# Patient Record
Sex: Female | Born: 1990 | Race: Black or African American | Hispanic: No | Marital: Single | Smoking: Current every day smoker
Health system: Southern US, Community
[De-identification: ages and names within clinical notes are randomized; demographics above are authoritative.]

## PROBLEM LIST (undated history)

## (undated) ENCOUNTER — Inpatient Hospital Stay (HOSPITAL_COMMUNITY): Payer: Self-pay

## (undated) DIAGNOSIS — A539 Syphilis, unspecified: Secondary | ICD-10-CM

## (undated) DIAGNOSIS — E039 Hypothyroidism, unspecified: Secondary | ICD-10-CM

## (undated) DIAGNOSIS — F32A Depression, unspecified: Secondary | ICD-10-CM

## (undated) DIAGNOSIS — A749 Chlamydial infection, unspecified: Secondary | ICD-10-CM

## (undated) DIAGNOSIS — F329 Major depressive disorder, single episode, unspecified: Secondary | ICD-10-CM

## (undated) HISTORY — DX: Syphilis, unspecified: A53.9

## (undated) HISTORY — PX: NOSE SURGERY: SHX723

## (undated) HISTORY — DX: Major depressive disorder, single episode, unspecified: F32.9

## (undated) HISTORY — DX: Depression, unspecified: F32.A

## (undated) HISTORY — PX: WISDOM TOOTH EXTRACTION: SHX21

---
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
## 2012-03-25 ENCOUNTER — Encounter: Payer: Medicaid Other | Admitting: Advanced Practice Midwife

## 2012-05-22 ENCOUNTER — Encounter (HOSPITAL_COMMUNITY): Payer: Self-pay | Admitting: *Deleted

## 2012-05-22 ENCOUNTER — Inpatient Hospital Stay (HOSPITAL_COMMUNITY)
Admission: AD | Admit: 2012-05-22 | Discharge: 2012-05-22 | Disposition: A | Discharge status: Home / Self Care | Payer: Medicaid Other | Source: Ambulatory Visit | Attending: Obstetrics & Gynecology | Admitting: Obstetrics & Gynecology

## 2012-05-22 ENCOUNTER — Inpatient Hospital Stay (HOSPITAL_COMMUNITY): Payer: Medicaid Other

## 2012-05-22 DIAGNOSIS — Z331 Pregnant state, incidental: Secondary | ICD-10-CM

## 2012-05-22 DIAGNOSIS — R109 Unspecified abdominal pain: Secondary | ICD-10-CM | POA: Insufficient documentation

## 2012-05-22 DIAGNOSIS — A499 Bacterial infection, unspecified: Secondary | ICD-10-CM | POA: Insufficient documentation

## 2012-05-22 DIAGNOSIS — B9689 Other specified bacterial agents as the cause of diseases classified elsewhere: Secondary | ICD-10-CM | POA: Insufficient documentation

## 2012-05-22 DIAGNOSIS — N760 Acute vaginitis: Secondary | ICD-10-CM | POA: Insufficient documentation

## 2012-05-22 DIAGNOSIS — Z3490 Encounter for supervision of normal pregnancy, unspecified, unspecified trimester: Secondary | ICD-10-CM

## 2012-05-22 DIAGNOSIS — O2390 Unspecified genitourinary tract infection in pregnancy, unspecified trimester: Secondary | ICD-10-CM | POA: Insufficient documentation

## 2012-05-22 LAB — CBC
HCT: 35.2 % — ABNORMAL LOW (ref 36.0–46.0)
MCH: 29.3 pg (ref 26.0–34.0)
MCV: 86.1 fL (ref 78.0–100.0)
RBC: 4.09 MIL/uL (ref 3.87–5.11)
WBC: 12.2 10*3/uL — ABNORMAL HIGH (ref 4.0–10.5)

## 2012-05-22 LAB — URINALYSIS, ROUTINE W REFLEX MICROSCOPIC
Bilirubin Urine: NEGATIVE
Glucose, UA: NEGATIVE mg/dL
Hgb urine dipstick: NEGATIVE
Ketones, ur: NEGATIVE mg/dL
Nitrite: NEGATIVE
Specific Gravity, Urine: 1.01 (ref 1.005–1.030)
pH: 7.5 (ref 5.0–8.0)

## 2012-05-22 LAB — WET PREP, GENITAL
Trich, Wet Prep: NONE SEEN
Yeast Wet Prep HPF POC: NONE SEEN

## 2012-05-22 MED ORDER — PRENATAL VIT-FEPOLY-FA-DHA 27-1-200 MG PO CAPS: 1.0000 | ORAL_CAPSULE | Freq: Every day | ORAL | Status: DC

## 2012-05-22 MED ORDER — METRONIDAZOLE 500 MG PO TABS: 2000.0000 mg | ORAL_TABLET | Freq: Once | ORAL | Status: AC

## 2012-05-22 NOTE — Progress Notes (Signed)
Marie Williams CNM in to discuss u/s results and d/c plan. Written and verbal d/c instructions given and understanding voiced. 

## 2012-05-22 NOTE — MAU Provider Note (Signed)
History     CSN: [id number]  Arrival date and time: 05/22/12 1946   First Provider Initiated Contact with Patient 05/22/12 2125      Chief Complaint  Patient presents with  . Amenorrhea  . Vaginal Discharge  . Abdominal Pain   HPI This is a 21 y.o. female at Unknown [place] ( 5+wks by LMP) who presents with c/o + UPT and pelvic cramps.  Has not seen anyone for pregnancy yet. Denies bleeding.   RN Note: Pt states, " I missed my period and my HPT was positive. I've been having pain in my low abdomen like my period is coming on all this month. I have been seeing [place] clumpy vaginal discharge with a little odor but no itching."       OB History    Grav Para Term Preterm Abortions TAB SAB Ect Mult Living   2               Past Medical History  Diagnosis Date  . No pertinent past medical history     Past Surgical History  Procedure Date  . Nose surgery   . Wisdom tooth extraction     Family History  Problem Relation Age of Onset  . Other Neg Hx     History  Substance Use Topics  . Smoking status: Current Some Day Smoker  . Smokeless tobacco: Not on file  . Alcohol Use: Yes     occasional    Allergies: No Known Allergies  Prescriptions prior to admission  Medication Sig Dispense Refill  . ibuprofen (ADVIL,MOTRIN) 100 MG tablet Take 400 mg by mouth every 6 (six) hours as needed. For pain or headache        ROS See HPI  Physical Exam   Blood pressure 108/64, pulse 84, temperature 98.3 F (36.8 C), temperature source Oral, resp. rate 18, height 5' 1.75" (1.568 m), weight 120 lb (54.432 kg), last menstrual period 04/15/2012.  Physical Exam  Constitutional: She is oriented to person, place, and time. She appears well-developed and well-nourished. No distress.  HENT:  Head: Normocephalic.  Cardiovascular: Normal rate.   Respiratory: Effort normal.  GI: Soft. She exhibits no distension and no mass. There is no tenderness. There is no rebound and no guarding.    Genitourinary: Vagina normal and uterus normal. No vaginal discharge found.       Uterus small, 5-6 week size Nontender. Adnexae nontender bilaterally Cervix long and closed  Musculoskeletal: Normal range of motion.  Neurological: She is alert and oriented to person, place, and time.  Skin: Skin is warm and dry.  Psychiatric: She has a normal mood and affect.   Results for orders placed during the hospital encounter of 05/22/12 (from the past 24 hour(s))  URINALYSIS, ROUTINE W REFLEX MICROSCOPIC     Status: Normal   Collection Time   05/22/12  8:21 PM      Component Value Range   Color, Urine YELLOW  YELLOW   APPearance CLEAR  CLEAR   Specific Gravity, Urine 1.010  1.005 - 1.030   pH 7.5  5.0 - 8.0   Glucose, UA NEGATIVE  NEGATIVE mg/dL   Hgb urine dipstick NEGATIVE  NEGATIVE   Bilirubin Urine NEGATIVE  NEGATIVE   Ketones, ur NEGATIVE  NEGATIVE mg/dL   Protein, ur NEGATIVE  NEGATIVE mg/dL   Urobilinogen, UA 0.2  0.0 - 1.0 mg/dL   Nitrite NEGATIVE  NEGATIVE   Leukocytes, UA NEGATIVE  NEGATIVE  POCT PREGNANCY, URINE  Status: Abnormal   Collection Time   05/22/12  8:31 PM      Component Value Range   Preg Test, Ur POSITIVE (*) NEGATIVE  HCG, QUANTITATIVE, PREGNANCY     Status: Abnormal   Collection Time   05/22/12  9:11 PM      Component Value Range   hCG, Beta Chain, Quant, [place] [address] (*) <5 mIU/mL  WET PREP, GENITAL     Status: Abnormal   Collection Time   05/22/12  9:26 PM      Component Value Range   Yeast Wet Prep HPF POC NONE SEEN  NONE SEEN   Trich, Wet Prep NONE SEEN  NONE SEEN   Clue Cells Wet Prep HPF POC MANY (*) NONE SEEN   WBC, Wet Prep HPF POC FEW (*) NONE SEEN  CBC     Status: Abnormal   Collection Time   05/22/12  9:28 PM      Component Value Range   WBC 12.2 (*) 4.0 - 10.5 K/uL   RBC 4.09  3.87 - 5.11 MIL/uL   Hemoglobin 12.0  12.0 - 15.0 g/dL   HCT 52.8 (*) 41.3 - 24.4 %   MCV 86.1  78.0 - 100.0 fL   MCH 29.3  26.0 - 34.0 pg   MCHC 34.1  30.0 - 36.0  g/dL   RDW 01.0  27.2 - 53.6 %   Platelets 340  150 - 400 K/uL    US Ob Comp Less 14 Wks  05/22/2012  The *RADIOLOGY REPORT*  Clinical Data: Right-sided pelvic pain  OBSTETRIC <14 WK [place] AND TRANSVAGINAL OB US  Technique:  Both transabdominal and transvaginal ultrasound examinations were performed for complete evaluation of the gestation as well as the maternal uterus, adnexal regions, and pelvic cul-de-sac.  Transvaginal technique was performed to assess early pregnancy.  Comparison:  None.  Intrauterine gestational sac:  Visualized/normal in shape. Yolk sac: Identified Embryo: Not definitively identified.  There is a question of an early fetal pole on the last image.  MSD: 12 mm  5 w 6 d [place] EDC: 01/16/2013  Maternal uterus/adnexae: No subchorionic hemorrhage.  Normal sonographic appearance to the ovaries with corpus luteum noted on the right.  No free fluid.  IMPRESSION: Single intrauterine gestational sac.  Contains a yolk sac however no definite embryo at this time.  This may be due to the early timing of the examination as the mean sac diameter measures only 12 mm.  Recommend serial quantitative beta HCG and ultrasound follow- up as warranted.   Original Report Authenticated By: Waneta Martins, M.D.      MAU Course  Procedures   Assessment and Plan  A:  SIUP at 5.6 weeks      + Yolk Sac seen      Early gestation      Bacterial Vaginosis  P:  Discussed with Dr Erin Fulling      Will have patient Follow up with prenatal care.      Rx Metronidazole and Prenatal Vitamins      No further USs necessary via MAU/ER at this time.  Erin Bell 05/22/2012, 10:14 PM

## 2012-05-22 NOTE — MAU Note (Signed)
Pt states, " I missed my period and my HPT was positive. I've been having pain in my low abdomen like my period is coming on all this month. I have been seeing [place] clumpy vaginal discharge with a little odor but no itching."

## 2012-05-23 NOTE — MAU Provider Note (Signed)
Attestation of Attending Supervision of Advanced Practitioner (CNM/NP): Evaluation and management procedures were performed by the Advanced Practitioner under my supervision and collaboration.  I have reviewed the Advanced Practitioner's note and chart, and I agree with the management and plan.  HARRAWAY-SMITH, Kyrianna Barletta 4:36 AM

## 2012-05-24 ENCOUNTER — Inpatient Hospital Stay (HOSPITAL_COMMUNITY)
Admission: AD | Admit: 2012-05-24 | Discharge: 2012-05-24 | Disposition: A | Discharge status: Home / Self Care | Payer: Medicaid Other | Source: Ambulatory Visit | Attending: Obstetrics & Gynecology | Admitting: Obstetrics & Gynecology

## 2012-05-25 LAB — GC/CHLAMYDIA PROBE AMP, GENITAL: GC Probe Amp, Genital: NEGATIVE

## 2012-05-27 ENCOUNTER — Telehealth: Payer: Self-pay | Admitting: *Deleted

## 2012-05-27 MED ORDER — AZITHROMYCIN 250 MG PO TABS: ORAL_TABLET | ORAL | Status: DC

## 2012-05-27 NOTE — Telephone Encounter (Signed)
Called pt and left message to return my call and leave message on nurse voice mail as to when she may be reached.  Pt needs to be informed of +chlamydia result, medication order has been sent to her pharmacy and her partner needs treatment also. [facility] form (Communicable Disease Report) completed and faxed.

## 2012-05-27 NOTE — Telephone Encounter (Signed)
Message copied by Jill Side on Wed May 27, 2012  1:59 PM ------      Message from: Mayra Neer P      Created: Wed May 27, 2012  8:32 AM       Please take care of this.      ----- Message -----         From: Juliette Mangle, RN         Sent: 05/27/2012   8:31 AM           To: [facility]            Please take care of this.      ----- Message -----         From: Lab In [facility] Interface         Sent: 05/25/2012   1:31 PM           To: [facility] Results

## 2012-05-28 NOTE — Telephone Encounter (Signed)
Called Erin Bell and left a message we are trying to call you with important  Information- please call office during office hours.

## 2012-05-28 NOTE — Telephone Encounter (Signed)
Pt called back stating she had received our message. Informed patient that her test results came back positive for chlamydia and that a medication had been called in for her to pick up at the [facility] on [address] and that she needed to inform her partner as well because they needed treatment. Told patient they could go to the health department and get treated for that. Patient voiced understanding and had no further questions.

## 2012-06-08 ENCOUNTER — Telehealth: Payer: Self-pay | Admitting: Obstetrics and Gynecology

## 2012-06-08 ENCOUNTER — Encounter (HOSPITAL_COMMUNITY): Payer: Self-pay | Admitting: *Deleted

## 2012-06-09 ENCOUNTER — Telehealth: Payer: Self-pay | Admitting: Obstetrics and Gynecology

## 2012-06-09 NOTE — Telephone Encounter (Signed)
Left msg on pt's voice mail to call back rgd msg. bt cma

## 2012-06-09 NOTE — Telephone Encounter (Signed)
Spoke with pt rgd msg. Pt stated that she went to the bathroom yesterday and had some mucous when she went to the bathroom. Pt also stated that she is not having the cramping or mucous today. Advised pt to make sure she is increasing her fluids. Pt's voice understanding. bt cma

## 2012-06-22 ENCOUNTER — Encounter (HOSPITAL_COMMUNITY): Payer: Self-pay | Admitting: Emergency Medicine

## 2012-06-22 ENCOUNTER — Emergency Department (HOSPITAL_COMMUNITY)
Admission: EM | Admit: 2012-06-22 | Discharge: 2012-06-22 | Disposition: A | Discharge status: Home / Self Care | Payer: Medicaid Other | Attending: Emergency Medicine | Admitting: Emergency Medicine

## 2012-06-22 DIAGNOSIS — M545 Low back pain, unspecified: Secondary | ICD-10-CM | POA: Insufficient documentation

## 2012-06-22 DIAGNOSIS — Z331 Pregnant state, incidental: Secondary | ICD-10-CM | POA: Insufficient documentation

## 2012-06-22 DIAGNOSIS — R109 Unspecified abdominal pain: Secondary | ICD-10-CM | POA: Insufficient documentation

## 2012-06-22 LAB — WET PREP, GENITAL
Clue Cells Wet Prep HPF POC: NONE SEEN
Trich, Wet Prep: NONE SEEN

## 2012-06-22 MED ORDER — CYCLOBENZAPRINE HCL 10 MG PO TABS: 10.0000 mg | ORAL_TABLET | Freq: Two times a day (BID) | ORAL | Status: DC | PRN

## 2012-06-22 NOTE — ED Provider Notes (Signed)
Medical screening examination/treatment/procedure(s) were performed by non-physician practitioner and as supervising physician I was immediately available for consultation/collaboration.  Jones Skene, M.D.     Jones Skene, MD 06/22/12 Rickey Primus
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
## 2012-07-31 LAB — OB RESULTS CONSOLE ANTIBODY SCREEN: Antibody Screen: NEGATIVE

## 2012-07-31 LAB — OB RESULTS CONSOLE ABO/RH: RH Type: POSITIVE

## 2012-07-31 LAB — OB RESULTS CONSOLE HEPATITIS B SURFACE ANTIGEN: Hepatitis B Surface Ag: NEGATIVE

## 2012-07-31 LAB — OB RESULTS CONSOLE RPR: RPR: NONREACTIVE

## 2012-08-10 ENCOUNTER — Inpatient Hospital Stay (HOSPITAL_COMMUNITY)
Admission: AD | Admit: 2012-08-10 | Discharge: 2012-08-10 | Disposition: A | Discharge status: Home / Self Care | Payer: Medicaid Other | Source: Ambulatory Visit | Attending: Obstetrics | Admitting: Obstetrics

## 2012-08-10 ENCOUNTER — Encounter (HOSPITAL_COMMUNITY): Payer: Self-pay | Admitting: *Deleted

## 2012-08-10 DIAGNOSIS — R109 Unspecified abdominal pain: Secondary | ICD-10-CM | POA: Insufficient documentation

## 2012-08-10 DIAGNOSIS — O99891 Other specified diseases and conditions complicating pregnancy: Secondary | ICD-10-CM | POA: Insufficient documentation

## 2012-08-10 DIAGNOSIS — N949 Unspecified condition associated with female genital organs and menstrual cycle: Secondary | ICD-10-CM

## 2012-08-10 LAB — URINALYSIS, ROUTINE W REFLEX MICROSCOPIC
Leukocytes, UA: NEGATIVE
Nitrite: NEGATIVE
Protein, ur: NEGATIVE mg/dL
Specific Gravity, Urine: 1.015 (ref 1.005–1.030)
Urobilinogen, UA: 0.2 mg/dL (ref 0.0–1.0)

## 2012-08-10 NOTE — MAU Provider Note (Signed)
Chief Complaint  Patient presents with  . Abdominal Pain    S: Erin Bell is a 21 y.o. G1P0 at [redacted]w[redacted]d weeks presenting with onset at 0200 of sharp  pain in right groin and lower abdomen. The pain is exacerbated by walking and changing positions. No dysuria, urgency or frequency. She denies contractions, vaginal bleeding or leakage of fluid. Fetus is active.  ROS: Negative except as noted above.  O: Filed Vitals:   08/10/12 1028  BP: 108/63  Pulse: 95  Temp: 97.8 F (36.6 C)  Resp: 18    Gen: NAD Abd: soft, mildly tender in lower abd and groin Pelvic: NEFG, BUS neg                          Cx: L/C/H FHR: 144 DT UCs: none  Results for orders placed during the hospital encounter of 08/10/12 (from the past 24 hour(s))  URINALYSIS, ROUTINE W REFLEX MICROSCOPIC     Status: Abnormal   Collection Time   08/10/12 10:34 AM      Component Value Range   Color, Urine YELLOW  YELLOW   APPearance HAZY (*) CLEAR   Specific Gravity, Urine 1.015  1.005 - 1.030   pH 8.0  5.0 - 8.0   Glucose, UA NEGATIVE  NEGATIVE mg/dL   Hgb urine dipstick NEGATIVE  NEGATIVE   Bilirubin Urine NEGATIVE  NEGATIVE   Ketones, ur NEGATIVE  NEGATIVE mg/dL   Protein, ur NEGATIVE  NEGATIVE mg/dL   Urobilinogen, UA 0.2  0.0 - 1.0 mg/dL   Nitrite NEGATIVE  NEGATIVE   Leukocytes, UA NEGATIVE  NEGATIVE    A:  G1 at [redacted]w[redacted]d Round Ligament Pain  P:  Reassurance given and general relief measures reviewed: avoidance of precipitating movements and AVS on RLP Call Dr. Elsie Stain office to reschedule this wk's appt if not having worse pain or other concerns

## 2012-08-10 NOTE — MAU Note (Signed)
Sharp pain in RLQ, first noted when got up to use the bathroom during the night, goes away with sitting or laying down.

## 2012-08-13 ENCOUNTER — Other Ambulatory Visit (HOSPITAL_COMMUNITY): Payer: Self-pay | Admitting: Obstetrics

## 2012-08-13 DIAGNOSIS — Z3480 Encounter for supervision of other normal pregnancy, unspecified trimester: Secondary | ICD-10-CM

## 2012-08-18 ENCOUNTER — Ambulatory Visit (HOSPITAL_COMMUNITY)
Admission: RE | Admit: 2012-08-18 | Discharge: 2012-08-18 | Disposition: A | Discharge status: Home / Self Care | Payer: Medicaid Other | Source: Ambulatory Visit | Attending: Obstetrics | Admitting: Obstetrics

## 2012-08-18 DIAGNOSIS — Z3480 Encounter for supervision of other normal pregnancy, unspecified trimester: Secondary | ICD-10-CM

## 2012-08-18 DIAGNOSIS — Z1389 Encounter for screening for other disorder: Secondary | ICD-10-CM | POA: Insufficient documentation

## 2012-08-18 DIAGNOSIS — O358XX0 Maternal care for other (suspected) fetal abnormality and damage, not applicable or unspecified: Secondary | ICD-10-CM | POA: Insufficient documentation

## 2012-08-18 DIAGNOSIS — Z363 Encounter for antenatal screening for malformations: Secondary | ICD-10-CM | POA: Insufficient documentation

## 2012-09-13 ENCOUNTER — Inpatient Hospital Stay (HOSPITAL_COMMUNITY)
Admission: AD | Admit: 2012-09-13 | Discharge: 2012-09-13 | Disposition: A | Discharge status: Home / Self Care | Payer: Medicaid Other | Source: Ambulatory Visit | Attending: Obstetrics | Admitting: Obstetrics

## 2012-09-13 ENCOUNTER — Encounter (HOSPITAL_COMMUNITY): Payer: Self-pay | Admitting: Obstetrics and Gynecology

## 2012-09-13 DIAGNOSIS — N949 Unspecified condition associated with female genital organs and menstrual cycle: Secondary | ICD-10-CM | POA: Insufficient documentation

## 2012-09-13 DIAGNOSIS — N760 Acute vaginitis: Secondary | ICD-10-CM

## 2012-09-13 DIAGNOSIS — O99891 Other specified diseases and conditions complicating pregnancy: Secondary | ICD-10-CM | POA: Insufficient documentation

## 2012-09-13 DIAGNOSIS — A499 Bacterial infection, unspecified: Secondary | ICD-10-CM

## 2012-09-13 HISTORY — DX: Chlamydial infection, unspecified: A74.9

## 2012-09-13 LAB — URINALYSIS, ROUTINE W REFLEX MICROSCOPIC
Glucose, UA: NEGATIVE mg/dL
Ketones, ur: NEGATIVE mg/dL
Leukocytes, UA: NEGATIVE
Nitrite: NEGATIVE
Protein, ur: NEGATIVE mg/dL
Urobilinogen, UA: 0.2 mg/dL (ref 0.0–1.0)

## 2012-09-13 LAB — WET PREP, GENITAL: Trich, Wet Prep: NONE SEEN

## 2012-09-13 MED ORDER — METRONIDAZOLE 500 MG PO TABS: 500.0000 mg | ORAL_TABLET | Freq: Two times a day (BID) | ORAL | Status: DC

## 2012-09-13 NOTE — MAU Note (Signed)
Pt presents to MAU with chief complaint of vaginal discharge that she presumes to be her mucous plug. Pt is [redacted]w[redacted]d; arrived by EMS with a discharge that she noticed today that was yellow with mucous present. Pt recently had intercourse yesterday around noon and denies pain. Pt is a G1, seen in Dr. Elsie Stain office

## 2012-09-13 NOTE — MAU Provider Note (Signed)
  History     CSN: [id number]  Arrival date and time: 09/13/12 1615   First Provider Initiated Contact with Patient 09/13/12 1643      Chief Complaint  Patient presents with  . Vaginal Discharge   HPI Erin Bell is a 21 y.o. female @ [redacted]w[redacted]d gestation who presents to MAU with vaginal discharge. Onset one week ago. She describes the discharge as yellow mucous. She denies pain or vaginal bleeding. She wanted to be sure she didn't loose her mucous plug or water broke. Last sexual intercourse yesterday without pain or bleeding. The history was provided by the patient.  OB History    Grav Para Term Preterm Abortions TAB SAB Ect Mult Living   1 0 0 0 0 0 0 0 0 0       Past Medical History  Diagnosis Date  . No pertinent past medical history   . Chlamydia     Past Surgical History  Procedure Date  . Nose surgery   . Wisdom tooth extraction     Family History  Problem Relation Age of Onset  . Other Neg Hx     History  Substance Use Topics  . Smoking status: Former Games developer  . Smokeless tobacco: Not on file  . Alcohol Use: No     Comment: occasional    Allergies: No Known Allergies  Prescriptions prior to admission  Medication Sig Dispense Refill  . acetaminophen (TYLENOL) 325 MG tablet Take 650 mg by mouth every 6 (six) hours as needed. pain      . Prenatal Vit-FePoly-FA-DHA 27-1-200 MG CAPS Take 1 tablet by mouth daily.  30 capsule  12    ROS: As stated in HPI Physical Exam   Blood pressure 121/63, pulse 95, temperature 99.3 F (37.4 C), temperature source Oral, resp. rate 18, last menstrual period 04/15/2012.  Physical Exam  Nursing note and vitals reviewed. Constitutional: She is oriented to person, place, and time. She appears well-developed and well-nourished. No distress.  HENT:  Head: Normocephalic and atraumatic.  Eyes: EOM are normal.  Neck: Neck supple.  Cardiovascular: Normal rate.   Respiratory: Effort normal.  GI: Soft. There is no tenderness.        Gravid, positive FHT's  Genitourinary:       External genitalia without lesions. Mucous discharge vaginal vault, no pooling. Uterus consistent with dates.  Musculoskeletal: Normal range of motion.  Neurological: She is alert and oriented to person, place, and time.  Skin: Skin is warm and dry.  Psychiatric: She has a normal mood and affect. Her behavior is normal. Judgment and thought content normal.   Assessment: 21 y.o. female @ [redacted]w[redacted]d gestation with vaginal discharge  Plan:  Cultures for GC and Chlamydia pending   Wet prep pending MAU Course: Care turned over to D. Poe @ 17:28 pm  Procedures  NEESE,HOPE, RN, FNP, [facility] 09/13/2012, 5:04 PM   Wet prep shows few clue cells >>  Will treat for BV  Discharge home Wynelle Bourgeois CNM

## 2012-09-14 LAB — GC/CHLAMYDIA PROBE AMP: CT Probe RNA: NEGATIVE

## 2012-11-09 ENCOUNTER — Inpatient Hospital Stay (HOSPITAL_COMMUNITY)
Admission: AD | Admit: 2012-11-09 | Discharge: 2012-11-09 | Disposition: A | Discharge status: Home / Self Care | Payer: Medicaid Other | Source: Ambulatory Visit | Attending: Obstetrics | Admitting: Obstetrics

## 2012-11-09 ENCOUNTER — Encounter (HOSPITAL_COMMUNITY): Payer: Self-pay

## 2012-11-09 DIAGNOSIS — O368190 Decreased fetal movements, unspecified trimester, not applicable or unspecified: Secondary | ICD-10-CM | POA: Insufficient documentation

## 2012-11-09 DIAGNOSIS — O219 Vomiting of pregnancy, unspecified: Secondary | ICD-10-CM

## 2012-11-09 DIAGNOSIS — K5900 Constipation, unspecified: Secondary | ICD-10-CM | POA: Insufficient documentation

## 2012-11-09 DIAGNOSIS — R109 Unspecified abdominal pain: Secondary | ICD-10-CM | POA: Insufficient documentation

## 2012-11-09 DIAGNOSIS — O210 Mild hyperemesis gravidarum: Secondary | ICD-10-CM | POA: Insufficient documentation

## 2012-11-09 LAB — URINALYSIS, ROUTINE W REFLEX MICROSCOPIC
Glucose, UA: NEGATIVE mg/dL
Leukocytes, UA: NEGATIVE
Protein, ur: NEGATIVE mg/dL
Specific Gravity, Urine: >1.03 — ABNORMAL HIGH (ref 1.005–1.030)

## 2012-11-09 MED ORDER — DEXTROSE 5 % IN LACTATED RINGERS IV BOLUS
1000.0000 mL | Freq: Once | INTRAVENOUS | Status: AC
  Administered 2012-11-09: 1000 mL via INTRAVENOUS

## 2012-11-09 MED ORDER — PROMETHAZINE HCL 25 MG PO TABS: 25.0000 mg | ORAL_TABLET | Freq: Four times a day (QID) | ORAL | Status: DC | PRN

## 2012-11-09 MED ORDER — PROMETHAZINE HCL 25 MG/ML IJ SOLN
12.5000 mg | Freq: Once | INTRAMUSCULAR | Status: AC
  Administered 2012-11-09: 12.5 mg via INTRAVENOUS
  Filled 2012-11-09: qty 1

## 2012-11-09 NOTE — MAU Provider Note (Signed)
History     CSN: [id number]  Arrival date and time: 11/09/12 1453   None     Chief Complaint  Patient presents with  . Emesis  . Abdominal Pain   HPI Pt is G1P0 [redacted]w[redacted]d pregnant and presents with n/v since this morning.  She also complains of lower abdominal when she walks or sitting up. She also has been constipated, going small amount at the time.  She also felt decreased FM since since this morning.  She denies fever, chillds, diarrhea, LOF or bleeding or ctx.  Baby is moving more now that has been all day.  Past Medical History  Diagnosis Date  . No pertinent past medical history   . Chlamydia     Past Surgical History  Procedure Laterality Date  . Nose surgery    . Wisdom tooth extraction      Family History  Problem Relation Age of Onset  . Other Neg Hx     History  Substance Use Topics  . Smoking status: Former Games developer  . Smokeless tobacco: Never Used  . Alcohol Use: No     Comment: occasional    Allergies: No Known Allergies  Prescriptions prior to admission  Medication Sig Dispense Refill  . Prenatal Vit-Fe Fumarate-FA (PRENATAL MULTIVITAMIN) TABS Take 1 tablet by mouth daily.        Review of Systems  Constitutional: Negative for fever and chills.  Respiratory: Negative for cough.   Gastrointestinal: Positive for nausea, vomiting, abdominal pain and constipation. Negative for diarrhea.  Genitourinary: Negative for dysuria and urgency.  Neurological: Negative for headaches.   Physical Exam   Blood pressure 112/67, pulse 108, temperature 98.3 F (36.8 C), temperature source Oral, resp. rate 16, height 5\' 2"  (1.575 m), weight 142 lb 3.2 oz (64.501 kg), last menstrual period 04/15/2012, SpO2 100.00%.  Physical Exam  Vitals reviewed. Constitutional: She is oriented to person, place, and time. She appears well-developed and well-nourished.  HENT:  Head: Normocephalic.  Eyes: Pupils are equal, round, and reactive to light.  Neck: Normal range of  motion. Neck supple.  Cardiovascular: Regular rhythm.   Tachycardia 108  Respiratory: Effort normal.  GI: Soft. Bowel sounds are normal. She exhibits no distension. There is no tenderness. There is no rebound.  Musculoskeletal: Normal range of motion.  Neurological: She is alert and oriented to person, place, and time.  Skin: Skin is warm and dry.  Psychiatric: She has a normal mood and affect.    MAU Course  Procedures Results for orders placed during the hospital encounter of 11/09/12 (from the past 24 hour(s))  URINALYSIS, ROUTINE W REFLEX MICROSCOPIC     Status: Abnormal   Collection Time    11/09/12  3:05 PM      Result Value Range   Color, Urine YELLOW  YELLOW   APPearance CLEAR  CLEAR   Specific Gravity, Urine >1.030 (*) 1.005 - 1.030   pH 6.0  5.0 - 8.0   Glucose, UA NEGATIVE  NEGATIVE mg/dL   Hgb urine dipstick NEGATIVE  NEGATIVE   Bilirubin Urine NEGATIVE  NEGATIVE   Ketones, ur 40 (*) NEGATIVE mg/dL   Protein, ur NEGATIVE  NEGATIVE mg/dL   Urobilinogen, UA 1.0  0.0 - 1.0 mg/dL   Nitrite NEGATIVE  NEGATIVE   Leukocytes, UA NEGATIVE  NEGATIVE  pt has not vomited since she has been in MAU IV hydration with D5LR Antiemetic phenergan 12.5 mg IV Care handed over to Georges Mouse, CNM Assessment and Plan  LINEBERRY,SUSAN 11/09/2012, 4:04 PM

## 2012-11-09 NOTE — MAU Note (Signed)
Pt states pain and n/v began today. Denies abnormal vag d/c or bleeding.

## 2012-11-09 NOTE — MAU Provider Note (Signed)
See preceding note from MAU visit by Pamelia Hoit, NP.   Pt feeling better after 1 L D5LR and Phenergan 12.5 mg IV.   A/P: 1. Nausea/vomiting in pregnancy       Medication List    TAKE these medications       prenatal multivitamin Tabs  Take 1 tablet by mouth daily.     promethazine 25 MG tablet  Commonly known as:  PHENERGAN  Take 1 tablet (25 mg total) by mouth every 6 (six) hours as needed for nausea.         Follow-up Information   Follow up with MARSHALL,BERNARD A, MD. (as scheduled or sooner as needed)    Contact information:   [address] [place] [address] [phone]

## 2012-11-09 NOTE — MAU Note (Signed)
Patient states she has vomitied x 3 today with general abdominal pain. Denies bleeding or leaking and feels fetal movement.

## 2012-12-21 LAB — OB RESULTS CONSOLE GBS: GBS: NEGATIVE

## 2012-12-21 LAB — OB RESULTS CONSOLE GC/CHLAMYDIA: Chlamydia: NEGATIVE

## 2013-01-01 ENCOUNTER — Telehealth (HOSPITAL_COMMUNITY): Payer: Self-pay | Admitting: *Deleted

## 2013-01-01 ENCOUNTER — Encounter (HOSPITAL_COMMUNITY): Payer: Self-pay | Admitting: *Deleted

## 2013-01-01 NOTE — Telephone Encounter (Signed)
Preadmission screen  

## 2013-01-14 ENCOUNTER — Inpatient Hospital Stay (HOSPITAL_COMMUNITY)
Admission: AD | Admit: 2013-01-14 | Discharge: 2013-01-14 | DRG: 780 | Disposition: A | Discharge status: Home / Self Care | Payer: Medicaid Other | Source: Ambulatory Visit | Attending: Obstetrics | Admitting: Obstetrics

## 2013-01-14 ENCOUNTER — Encounter (HOSPITAL_COMMUNITY): Payer: Self-pay

## 2013-01-14 DIAGNOSIS — O479 False labor, unspecified: Principal | ICD-10-CM | POA: Diagnosis present

## 2013-01-14 DIAGNOSIS — O321XX0 Maternal care for breech presentation, not applicable or unspecified: Secondary | ICD-10-CM | POA: Diagnosis present

## 2013-01-14 LAB — CBC
HCT: 40.2 % (ref 36.0–46.0)
Hemoglobin: 13.5 g/dL (ref 12.0–15.0)
MCH: 29 pg (ref 26.0–34.0)
MCHC: 33.6 g/dL (ref 30.0–36.0)
MCV: 86.5 fL (ref 78.0–100.0)
RBC: 4.65 MIL/uL (ref 3.87–5.11)

## 2013-01-14 MED ORDER — OXYTOCIN 40 UNITS IN LACTATED RINGERS INFUSION - SIMPLE MED: 62.5000 mL/h | INTRAVENOUS | Status: DC

## 2013-01-14 MED ORDER — LACTATED RINGERS IV SOLN: 500.0000 mL | INTRAVENOUS | Status: DC | PRN

## 2013-01-14 MED ORDER — LACTATED RINGERS IV SOLN
INTRAVENOUS | Status: DC
  Administered 2013-01-14: 17:00:00 via INTRAVENOUS

## 2013-01-14 MED ORDER — CITRIC ACID-SODIUM CITRATE 334-500 MG/5ML PO SOLN: 30.0000 mL | ORAL | Status: DC | PRN

## 2013-01-14 MED ORDER — LIDOCAINE HCL (PF) 1 % IJ SOLN: 30.0000 mL | INTRAMUSCULAR | Status: DC | PRN

## 2013-01-14 MED ORDER — IBUPROFEN 600 MG PO TABS: 600.0000 mg | ORAL_TABLET | Freq: Four times a day (QID) | ORAL | Status: DC | PRN

## 2013-01-14 MED ORDER — OXYCODONE-ACETAMINOPHEN 5-325 MG PO TABS: 1.0000 | ORAL_TABLET | ORAL | Status: DC | PRN

## 2013-01-14 MED ORDER — ONDANSETRON HCL 4 MG/2ML IJ SOLN: 4.0000 mg | Freq: Four times a day (QID) | INTRAMUSCULAR | Status: DC | PRN

## 2013-01-14 MED ORDER — OXYTOCIN BOLUS FROM INFUSION: 500.0000 mL | INTRAVENOUS | Status: DC

## 2013-01-14 MED ORDER — BUTORPHANOL TARTRATE 1 MG/ML IJ SOLN
1.0000 mg | INTRAMUSCULAR | Status: DC | PRN
  Administered 2013-01-14 (×2): 1 mg via INTRAVENOUS
  Filled 2013-01-14 (×2): qty 1

## 2013-01-14 MED ORDER — ACETAMINOPHEN 325 MG PO TABS: 650.0000 mg | ORAL_TABLET | ORAL | Status: DC | PRN

## 2013-01-14 NOTE — Discharge Summary (Signed)
Physician Discharge Summary  Patient ID: Erin Bell MRN: [id number] DOB/AGE: 17-Dec-1990 22 y.o.  Admit date: 01/14/2013 Discharge date: 01/14/2013  Admission Diagnoses: 39 weeks.  Uterine contractions  Discharge Diagnoses: Same.  Not in labor Active Problems:   * No active hospital problems. *    Discharged Condition: good  Hospital Course: Admitted with uterine contractions and 3 cm dilatations.  There no cervical change after several hours of observation and contraction intensity and frequency decreased.  The patient was therefore discharged  Home.  Consults: None  Significant Diagnostic Studies: none  Treatments: IV hydration  Discharge Exam: Blood pressure 125/68, pulse 94, temperature 98.1 F (36.7 C), temperature source Oral, resp. rate 18, height 5' 1.5" (1.562 m), weight 157 lb (71.215 kg), last menstrual period 04/15/2012. General appearance: alert and no distress GI: normal findings: soft, non-tender cervix 3 cm, unchanged from admission  Disposition: 01-Home or Self Care  Discharge Orders   Future Orders Complete By Expires     Discharge activity:  No Restrictions  As directed     Discharge diet:  No restrictions  As directed     Discharge instructions  As directed     Comments:      Routine    Discharge patient  As directed     LABOR:  When conractions begin, you should start to time them from the beginning of one contraction to the beginning  of the next.  When contractions are 5 - 10 minutes apart or less and have been regular for at least an hour, you should call your health care provider.  As directed     No sexual activity restrictions  As directed     Notify physician for bleeding from the vagina  As directed     Notify physician for blurring of vision or spots before the eyes  As directed     Notify physician for chills or fever  As directed     Notify physician for fainting spells, "black outs" or loss of consciousness  As directed     Notify  physician for increase in vaginal discharge  As directed     Notify physician for leaking of fluid  As directed     Notify physician for pain or burning when urinating  As directed     Notify physician for pelvic pressure (sudden increase)  As directed     Notify physician for severe or continued nausea or vomiting  As directed     Notify physician for sudden gushing of fluid from the vagina (with or without continued leaking)  As directed     Notify physician for sudden, constant, or occasional abdominal pain  As directed     Notify physician if baby moving less than usual  As directed         Medication List    TAKE these medications       prenatal multivitamin Tabs  Take 1 tablet by mouth daily.           Follow-up Information   Follow up with MARSHALL,BERNARD A, MD. Schedule an appointment as soon as possible for a visit in 1 week.   Contact information:   [address] [place] [address] [phone]       Signed: HARPER,CHARLES A 01/14/2013, 9:50 PM

## 2013-01-14 NOTE — MAU Note (Signed)
Called back, immediately to restroom.

## 2013-01-14 NOTE — MAU Note (Signed)
Been having contractions for 2 days, got intense today- just wanted to see what is going on.

## 2013-01-14 NOTE — MAU Note (Signed)
Pt states having u/c's yesterday, denies bleeding or lof.

## 2013-01-14 NOTE — MAU Note (Signed)
Dr. Gaynell Face notified of pt's c/o ctx's, efm tracing, cervical exam, order to walk x1 hour then recheck cervix and call with results.

## 2013-01-15 ENCOUNTER — Encounter (HOSPITAL_COMMUNITY): Payer: Self-pay | Admitting: Anesthesiology

## 2013-01-15 ENCOUNTER — Inpatient Hospital Stay (HOSPITAL_COMMUNITY): Payer: Medicaid Other | Admitting: Anesthesiology

## 2013-01-15 ENCOUNTER — Encounter (HOSPITAL_COMMUNITY): Payer: Self-pay | Admitting: *Deleted

## 2013-01-15 ENCOUNTER — Encounter (HOSPITAL_COMMUNITY)
Admission: AD | Disposition: A | Discharge status: Home / Self Care | Payer: Self-pay | Source: Ambulatory Visit | Attending: Obstetrics

## 2013-01-15 ENCOUNTER — Inpatient Hospital Stay (HOSPITAL_COMMUNITY)
Admission: AD | Admit: 2013-01-15 | Discharge: 2013-01-18 | DRG: 766 | Disposition: A | Discharge status: Home / Self Care | Payer: Medicaid Other | Source: Ambulatory Visit | Attending: Obstetrics | Admitting: Obstetrics

## 2013-01-15 DIAGNOSIS — O321XX0 Maternal care for breech presentation, not applicable or unspecified: Principal | ICD-10-CM | POA: Diagnosis present

## 2013-01-15 LAB — CBC
MCH: 28.8 pg (ref 26.0–34.0)
MCHC: 33.4 g/dL (ref 30.0–36.0)
Platelets: 231 10*3/uL (ref 150–400)

## 2013-01-15 SURGERY — Surgical Case
Anesthesia: Spinal | Site: Abdomen | Wound class: Clean Contaminated

## 2013-01-15 MED ORDER — SIMETHICONE 80 MG PO CHEW
80.0000 mg | CHEWABLE_TABLET | ORAL | Status: DC | PRN
  Administered 2013-01-17: 80 mg via ORAL

## 2013-01-15 MED ORDER — KETOROLAC TROMETHAMINE 30 MG/ML IJ SOLN
INTRAMUSCULAR | Status: AC
  Administered 2013-01-15: 30 mg via INTRAVENOUS
  Filled 2013-01-15: qty 1

## 2013-01-15 MED ORDER — SCOPOLAMINE 1 MG/3DAYS TD PT72
MEDICATED_PATCH | TRANSDERMAL | Status: AC
  Administered 2013-01-15: 1.5 mg via TRANSDERMAL
  Filled 2013-01-15: qty 1

## 2013-01-15 MED ORDER — ONDANSETRON HCL 4 MG/2ML IJ SOLN: 4.0000 mg | Freq: Three times a day (TID) | INTRAMUSCULAR | Status: DC | PRN

## 2013-01-15 MED ORDER — OXYTOCIN 40 UNITS IN LACTATED RINGERS INFUSION - SIMPLE MED: 62.5000 mL/h | INTRAVENOUS | Status: AC

## 2013-01-15 MED ORDER — MEPERIDINE HCL 25 MG/ML IJ SOLN: 6.2500 mg | INTRAMUSCULAR | Status: DC | PRN

## 2013-01-15 MED ORDER — FENTANYL CITRATE 0.05 MG/ML IJ SOLN: 25.0000 ug | INTRAMUSCULAR | Status: DC | PRN

## 2013-01-15 MED ORDER — LIDOCAINE HCL (PF) 1 % IJ SOLN
30.0000 mL | INTRAMUSCULAR | Status: DC | PRN
  Filled 2013-01-15: qty 30

## 2013-01-15 MED ORDER — SODIUM CHLORIDE 0.9 % IJ SOLN: 3.0000 mL | INTRAMUSCULAR | Status: DC | PRN

## 2013-01-15 MED ORDER — KETOROLAC TROMETHAMINE 30 MG/ML IJ SOLN: 30.0000 mg | Freq: Four times a day (QID) | INTRAMUSCULAR | Status: AC | PRN

## 2013-01-15 MED ORDER — ONDANSETRON HCL 4 MG/2ML IJ SOLN
INTRAMUSCULAR | Status: AC
  Filled 2013-01-15: qty 2

## 2013-01-15 MED ORDER — METOCLOPRAMIDE HCL 5 MG/ML IJ SOLN: 10.0000 mg | Freq: Three times a day (TID) | INTRAMUSCULAR | Status: DC | PRN

## 2013-01-15 MED ORDER — ONDANSETRON HCL 4 MG PO TABS: 4.0000 mg | ORAL_TABLET | ORAL | Status: DC | PRN

## 2013-01-15 MED ORDER — PHENYLEPHRINE 40 MCG/ML (10ML) SYRINGE FOR IV PUSH (FOR BLOOD PRESSURE SUPPORT)
PREFILLED_SYRINGE | INTRAVENOUS | Status: AC
  Filled 2013-01-15: qty 5

## 2013-01-15 MED ORDER — SIMETHICONE 80 MG PO CHEW
80.0000 mg | CHEWABLE_TABLET | Freq: Three times a day (TID) | ORAL | Status: DC
  Administered 2013-01-15 – 2013-01-18 (×7): 80 mg via ORAL

## 2013-01-15 MED ORDER — MORPHINE SULFATE (PF) 0.5 MG/ML IJ SOLN
INTRAMUSCULAR | Status: DC | PRN
  Administered 2013-01-15: .15 mg via EPIDURAL

## 2013-01-15 MED ORDER — PRENATAL MULTIVITAMIN CH
1.0000 | ORAL_TABLET | Freq: Every day | ORAL | Status: DC
  Administered 2013-01-16 – 2013-01-17 (×2): 1 via ORAL
  Filled 2013-01-15 (×2): qty 1

## 2013-01-15 MED ORDER — ONDANSETRON HCL 4 MG/2ML IJ SOLN: 4.0000 mg | INTRAMUSCULAR | Status: DC | PRN

## 2013-01-15 MED ORDER — FENTANYL CITRATE 0.05 MG/ML IJ SOLN
50.0000 ug | INTRAMUSCULAR | Status: DC | PRN
  Administered 2013-01-15: 50 ug via INTRAVENOUS
  Filled 2013-01-15: qty 2

## 2013-01-15 MED ORDER — DIPHENHYDRAMINE HCL 50 MG/ML IJ SOLN: 25.0000 mg | INTRAMUSCULAR | Status: DC | PRN

## 2013-01-15 MED ORDER — DIPHENHYDRAMINE HCL 50 MG/ML IJ SOLN: 12.5000 mg | INTRAMUSCULAR | Status: DC | PRN

## 2013-01-15 MED ORDER — PHENYLEPHRINE HCL 10 MG/ML IJ SOLN
INTRAMUSCULAR | Status: DC | PRN
  Administered 2013-01-15: 40 ug via INTRAVENOUS
  Administered 2013-01-15: 120 ug via INTRAVENOUS
  Administered 2013-01-15: 40 ug via INTRAVENOUS

## 2013-01-15 MED ORDER — ACETAMINOPHEN 10 MG/ML IV SOLN
1000.0000 mg | Freq: Four times a day (QID) | INTRAVENOUS | Status: AC | PRN
  Filled 2013-01-15: qty 100

## 2013-01-15 MED ORDER — LANOLIN HYDROUS EX OINT
1.0000 | TOPICAL_OINTMENT | CUTANEOUS | Status: DC | PRN
Start: 2013-01-15 — End: 2013-01-18

## 2013-01-15 MED ORDER — IBUPROFEN 600 MG PO TABS
600.0000 mg | ORAL_TABLET | Freq: Four times a day (QID) | ORAL | Status: DC
  Administered 2013-01-15 – 2013-01-18 (×7): 600 mg via ORAL
  Filled 2013-01-15 (×2): qty 1

## 2013-01-15 MED ORDER — ONDANSETRON HCL 4 MG/2ML IJ SOLN: 4.0000 mg | Freq: Four times a day (QID) | INTRAMUSCULAR | Status: DC | PRN

## 2013-01-15 MED ORDER — OXYTOCIN 40 UNITS IN LACTATED RINGERS INFUSION - SIMPLE MED: 62.5000 mL/h | INTRAVENOUS | Status: DC

## 2013-01-15 MED ORDER — LACTATED RINGERS IV SOLN
INTRAVENOUS | Status: DC
  Administered 2013-01-15 (×3): via INTRAVENOUS

## 2013-01-15 MED ORDER — DIPHENHYDRAMINE HCL 25 MG PO CAPS
25.0000 mg | ORAL_CAPSULE | ORAL | Status: DC | PRN
  Filled 2013-01-15: qty 1

## 2013-01-15 MED ORDER — ZOLPIDEM TARTRATE 5 MG PO TABS: 5.0000 mg | ORAL_TABLET | Freq: Every evening | ORAL | Status: DC | PRN

## 2013-01-15 MED ORDER — LACTATED RINGERS IV SOLN
INTRAVENOUS | Status: DC
  Administered 2013-01-15: 16:00:00 via INTRAVENOUS

## 2013-01-15 MED ORDER — LACTATED RINGERS IV SOLN
500.0000 mL | INTRAVENOUS | Status: DC | PRN
Start: 2013-01-15 — End: 2013-01-15

## 2013-01-15 MED ORDER — MENTHOL 3 MG MT LOZG: 1.0000 | LOZENGE | OROMUCOSAL | Status: DC | PRN

## 2013-01-15 MED ORDER — TETANUS-DIPHTH-ACELL PERTUSSIS 5-2.5-18.5 LF-MCG/0.5 IM SUSP
0.5000 mL | Freq: Once | INTRAMUSCULAR | Status: AC
  Administered 2013-01-16: 0.5 mL via INTRAMUSCULAR

## 2013-01-15 MED ORDER — KETOROLAC TROMETHAMINE 60 MG/2ML IM SOLN
60.0000 mg | Freq: Once | INTRAMUSCULAR | Status: AC | PRN
  Filled 2013-01-15: qty 2

## 2013-01-15 MED ORDER — DIPHENHYDRAMINE HCL 25 MG PO CAPS: 25.0000 mg | ORAL_CAPSULE | Freq: Four times a day (QID) | ORAL | Status: DC | PRN

## 2013-01-15 MED ORDER — EPHEDRINE SULFATE 50 MG/ML IJ SOLN
INTRAMUSCULAR | Status: DC | PRN
  Administered 2013-01-15: 10 mg via INTRAVENOUS
  Administered 2013-01-15: 5 mg via INTRAVENOUS
  Administered 2013-01-15 (×2): 10 mg via INTRAVENOUS
  Administered 2013-01-15 (×2): 5 mg via INTRAVENOUS

## 2013-01-15 MED ORDER — LACTATED RINGERS IV SOLN
INTRAVENOUS | Status: DC | PRN
  Administered 2013-01-15 (×2): via INTRAVENOUS

## 2013-01-15 MED ORDER — FENTANYL CITRATE 0.05 MG/ML IJ SOLN
INTRAMUSCULAR | Status: DC | PRN
  Administered 2013-01-15: 25 ug via INTRAVENOUS

## 2013-01-15 MED ORDER — IBUPROFEN 600 MG PO TABS
600.0000 mg | ORAL_TABLET | Freq: Four times a day (QID) | ORAL | Status: DC | PRN
  Administered 2013-01-16 – 2013-01-17 (×2): 600 mg via ORAL
  Filled 2013-01-15 (×8): qty 1

## 2013-01-15 MED ORDER — NALBUPHINE HCL 10 MG/ML IJ SOLN
5.0000 mg | INTRAMUSCULAR | Status: DC | PRN
  Filled 2013-01-15: qty 1

## 2013-01-15 MED ORDER — ONDANSETRON HCL 4 MG/2ML IJ SOLN
INTRAMUSCULAR | Status: DC | PRN
  Administered 2013-01-15: 4 mg via INTRAVENOUS

## 2013-01-15 MED ORDER — NALOXONE HCL 1 MG/ML IJ SOLN
1.0000 ug/kg/h | INTRAVENOUS | Status: DC | PRN
  Filled 2013-01-15: qty 2

## 2013-01-15 MED ORDER — WITCH HAZEL-GLYCERIN EX PADS: 1.0000 "application " | MEDICATED_PAD | CUTANEOUS | Status: DC | PRN

## 2013-01-15 MED ORDER — EPHEDRINE 5 MG/ML INJ
INTRAVENOUS | Status: AC
  Filled 2013-01-15: qty 10

## 2013-01-15 MED ORDER — FENTANYL CITRATE 0.05 MG/ML IJ SOLN
INTRAMUSCULAR | Status: AC
  Filled 2013-01-15: qty 2

## 2013-01-15 MED ORDER — OXYTOCIN BOLUS FROM INFUSION: 500.0000 mL | INTRAVENOUS | Status: DC

## 2013-01-15 MED ORDER — NALOXONE HCL 0.4 MG/ML IJ SOLN: 0.4000 mg | INTRAMUSCULAR | Status: DC | PRN

## 2013-01-15 MED ORDER — SCOPOLAMINE 1 MG/3DAYS TD PT72: 1.0000 | MEDICATED_PATCH | Freq: Once | TRANSDERMAL | Status: AC

## 2013-01-15 MED ORDER — MORPHINE SULFATE 0.5 MG/ML IJ SOLN
INTRAMUSCULAR | Status: AC
  Filled 2013-01-15: qty 10

## 2013-01-15 MED ORDER — DIBUCAINE 1 % RE OINT: 1.0000 "application " | TOPICAL_OINTMENT | RECTAL | Status: DC | PRN

## 2013-01-15 MED ORDER — SENNOSIDES-DOCUSATE SODIUM 8.6-50 MG PO TABS
2.0000 | ORAL_TABLET | Freq: Every day | ORAL | Status: DC
  Administered 2013-01-15 – 2013-01-17 (×3): 2 via ORAL

## 2013-01-15 MED ORDER — OXYTOCIN 10 UNIT/ML IJ SOLN
INTRAMUSCULAR | Status: AC
  Filled 2013-01-15: qty 4

## 2013-01-15 MED ORDER — CITRIC ACID-SODIUM CITRATE 334-500 MG/5ML PO SOLN
ORAL | Status: AC
  Filled 2013-01-15: qty 15

## 2013-01-15 MED ORDER — OXYTOCIN 10 UNIT/ML IJ SOLN
40.0000 [IU] | INTRAVENOUS | Status: DC | PRN
  Administered 2013-01-15: 40 [IU] via INTRAVENOUS

## 2013-01-15 MED ORDER — OXYCODONE-ACETAMINOPHEN 5-325 MG PO TABS
1.0000 | ORAL_TABLET | ORAL | Status: DC | PRN
  Administered 2013-01-15 – 2013-01-17 (×8): 1 via ORAL
  Administered 2013-01-18: 2 via ORAL

## 2013-01-15 MED ORDER — ACETAMINOPHEN 325 MG PO TABS: 650.0000 mg | ORAL_TABLET | ORAL | Status: DC | PRN

## 2013-01-15 MED ORDER — FLEET ENEMA 7-19 GM/118ML RE ENEM: 1.0000 | ENEMA | RECTAL | Status: DC | PRN

## 2013-01-15 MED ORDER — NALBUPHINE HCL 10 MG/ML IJ SOLN
10.0000 mg | INTRAMUSCULAR | Status: DC | PRN
  Filled 2013-01-15: qty 1

## 2013-01-15 MED ORDER — OXYTOCIN 40 UNITS IN LACTATED RINGERS INFUSION - SIMPLE MED
INTRAVENOUS | Status: AC
  Filled 2013-01-15: qty 1000

## 2013-01-15 MED ORDER — LIDOCAINE HCL (PF) 1 % IJ SOLN
INTRAMUSCULAR | Status: AC
  Filled 2013-01-15: qty 30

## 2013-01-15 MED ORDER — CEFAZOLIN SODIUM-DEXTROSE 2-3 GM-% IV SOLR
INTRAVENOUS | Status: DC | PRN
  Administered 2013-01-15: 2 g via INTRAVENOUS

## 2013-01-15 MED ORDER — CITRIC ACID-SODIUM CITRATE 334-500 MG/5ML PO SOLN
30.0000 mL | ORAL | Status: DC | PRN
  Administered 2013-01-15: 30 mL via ORAL

## 2013-01-15 MED ORDER — OXYCODONE-ACETAMINOPHEN 5-325 MG PO TABS
1.0000 | ORAL_TABLET | ORAL | Status: DC | PRN
  Administered 2013-01-16 – 2013-01-17 (×3): 1 via ORAL
  Filled 2013-01-15 (×11): qty 1
  Filled 2013-01-15: qty 2

## 2013-01-15 SURGICAL SUPPLY — 30 items
CLOTH BEACON ORANGE TIMEOUT ST (SAFETY) ×2 IMPLANT
DERMABOND ADVANCED (GAUZE/BANDAGES/DRESSINGS) ×1
DERMABOND ADVANCED .7 DNX12 (GAUZE/BANDAGES/DRESSINGS) ×1 IMPLANT
DRAPE LG THREE QUARTER DISP (DRAPES) ×2 IMPLANT
DRSG OPSITE POSTOP 4X10 (GAUZE/BANDAGES/DRESSINGS) ×2 IMPLANT
DURAPREP 26ML APPLICATOR (WOUND CARE) ×2 IMPLANT
ELECT REM PT RETURN 9FT ADLT (ELECTROSURGICAL) ×2
ELECTRODE REM PT RTRN 9FT ADLT (ELECTROSURGICAL) ×1 IMPLANT
EXTRACTOR VACUUM M CUP 4 TUBE (SUCTIONS) IMPLANT
GLOVE BIO SURGEON STRL SZ8.5 (GLOVE) ×2 IMPLANT
GOWN PREVENTION PLUS XXLARGE (GOWN DISPOSABLE) ×2 IMPLANT
GOWN STRL REIN XL XLG (GOWN DISPOSABLE) ×4 IMPLANT
KIT ABG SYR 3ML LUER SLIP (SYRINGE) IMPLANT
NEEDLE HYPO 25X5/8 SAFETYGLIDE (NEEDLE) ×2 IMPLANT
NS IRRIG 1000ML POUR BTL (IV SOLUTION) ×2 IMPLANT
PACK C SECTION WH (CUSTOM PROCEDURE TRAY) ×2 IMPLANT
PAD OB MATERNITY 4.3X12.25 (PERSONAL CARE ITEMS) ×2 IMPLANT
SLEEVE SCD COMPRESS KNEE MED (MISCELLANEOUS) IMPLANT
SUT CHROMIC 0 CT 802H (SUTURE) ×2 IMPLANT
SUT CHROMIC 1 CTX 36 (SUTURE) ×4 IMPLANT
SUT CHROMIC 2 0 SH (SUTURE) ×2 IMPLANT
SUT GUT PLAIN 0 CT-3 TAN 27 (SUTURE) IMPLANT
SUT MON AB 4-0 PS1 27 (SUTURE) ×2 IMPLANT
SUT VIC AB 0 CT1 18XCR BRD8 (SUTURE) IMPLANT
SUT VIC AB 0 CT1 8-18 (SUTURE)
SUT VIC AB 0 CTX 36 (SUTURE) ×2
SUT VIC AB 0 CTX36XBRD ANBCTRL (SUTURE) ×2 IMPLANT
TOWEL OR 17X24 6PK STRL BLUE (TOWEL DISPOSABLE) ×6 IMPLANT
TRAY FOLEY CATH 14FR (SET/KITS/TRAYS/PACK) ×2 IMPLANT
WATER STERILE IRR 1000ML POUR (IV SOLUTION) IMPLANT

## 2013-01-15 NOTE — Progress Notes (Signed)
Erin Bell is a 22 y.o. G1P0000 at [redacted]w[redacted]d by LMP admitted for active labor  Subjective:   Objective: BP 120/74  Pulse 102  Temp(Src) 98.1 F (36.7 C) (Oral)  Resp 18  SpO2 100%  LMP 04/15/2012      FHT:  FHR: 150 bpm, variability: moderate,  accelerations:  Present,  decelerations:  Present variables UC:   regular, every 2-3 minutes SVE:   Dilation: Lip/rim Effacement (%): 100 Station: +1 Exam by:: L. Dupell, RN  Labs: Lab Results  Component Value Date   WBC 14.7* 01/14/2013   HGB 13.5 01/14/2013   HCT 40.2 01/14/2013   MCV 86.5 01/14/2013   PLT 243 01/14/2013    Assessment / Plan: Spontaneous labor, progressing normally  Labor: Progressing normally Preeclampsia:  n/a Fetal Wellbeing:  Category I Pain Control:  Labor support without medications I/D:  n/a Anticipated MOD:  NSVD  Toshi Ishii A 01/15/2013, 7:05 AM

## 2013-01-15 NOTE — H&P (Signed)
Erin Bell is a 22 y.o. female presenting for UC's. Maternal Medical History:  Reason for admission: Contractions.  21 y o G1.  EDC 01-20-13.  Presents with UC's.  Contractions: Onset was yesterday.    Fetal activity: Perceived fetal activity is normal.   Last perceived fetal movement was within the past hour.    Prenatal complications: no prenatal complications Prenatal Complications - Diabetes: none.    OB History   Grav Para Term Preterm Abortions TAB SAB Ect Mult Living   1 0 0 0 0 0 0 0 0 0      Past Medical History  Diagnosis Date  . No pertinent past medical history   . Chlamydia    Past Surgical History  Procedure Laterality Date  . Nose surgery    . Wisdom tooth extraction     Family History: family history includes Arthritis in her father; Asthma in her brother; Birth defects in her sister; and Diabetes in her maternal uncle.  There is no history of Other, and Alcohol abuse, and Hypertension, and Hyperlipidemia, and Heart disease, and Hearing loss, and Early death, and Drug abuse, and Miscarriages / Stillbirths, and Mental retardation, and Mental illness, and Learning disabilities, and Kidney disease, and Stroke, and Vision loss, . Social History:  reports that she quit smoking about 8 months ago. She has never used smokeless tobacco. She reports that she does not drink alcohol or use illicit drugs.   Prenatal Transfer Tool  Maternal Diabetes: No Genetic Screening: Normal Maternal Ultrasounds/Referrals: Normal Fetal Ultrasounds or other Referrals:  None Maternal Substance Abuse:  No Significant Maternal Medications:  Meds include: Other: PNV Significant Maternal Lab Results:  None Other Comments:  None  Review of Systems  All other systems reviewed and are negative.    Dilation: Lip/rim Effacement (%): 100 Station: +1 Exam by:: L. Dupell, RN Blood pressure 120/74, pulse 93, temperature 98.1 F (36.7 C), temperature source Oral, resp. rate 18, last  menstrual period 04/15/2012, SpO2 100.00%. Maternal Exam:  Uterine Assessment: Contraction strength is firm.  Contraction frequency is regular.   Abdomen: Patient reports no abdominal tenderness. Fetal presentation: vertex  Introitus: Normal vulva. Normal vagina.  Pelvis: adequate for delivery.   Cervix: Cervix evaluated by digital exam.     Physical Exam  Nursing note and vitals reviewed. Constitutional: She is oriented to person, place, and time. She appears well-developed and well-nourished.  HENT:  Head: Normocephalic and atraumatic.  Eyes: Conjunctivae are normal. Pupils are equal, round, and reactive to light.  Neck: Normal range of motion. Neck supple.  Cardiovascular: Normal rate and regular rhythm.   Respiratory: Effort normal.  GI: Soft.  Genitourinary: Vagina normal and uterus normal.  Musculoskeletal: Normal range of motion.  Neurological: She is alert and oriented to person, place, and time.  Skin: Skin is warm and dry.  Psychiatric: She has a normal mood and affect. Her behavior is normal. Judgment and thought content normal.    Prenatal labs: ABO, Rh: B/Positive/-- (11/01 0000) Antibody: Negative (11/01 0000) Rubella: Immune (11/01 0000) RPR: NON REACTIVE (04/17 1715)  HBsAg: Negative (11/01 0000)  HIV: Non-reactive (11/01 0000)  GBS: Negative (03/24 0000)   Assessment/Plan: 39 weeks.  Active labor.  Expectant.   Erin Bell A 01/15/2013, 6:57 AM

## 2013-01-15 NOTE — Preoperative (Signed)
Beta Blockers   Reason not to administer Beta Blockers:Not Applicable 

## 2013-01-15 NOTE — Op Note (Signed)
preop diagnosis IUP at term fully dilated breech presentation Postop diagnosis frank breech First assistant Dr. Kirtland Bouchard Surgeon Dr. Francoise Ceo Anesthesia spinal procedure Patient placed on the operating table in the supine position after the spinal administered abdomen prepped and draped bladder emptied with a Foley catheter a transverse suprapubic incision made carried down to the rectus fascia fascia cleaned and incised the length of the incision recti muscles retracted laterally peritoneum incised longitudinally a transverse incision made on the peritoneum and the bladder mobilized inferiorly transverse low uterine incision made patient delivered of a frank breech in the usual manner Apgar 8 and 9 team in attendance  Placenta fundal removed manually uterine cavity clean with dry laps uterine incision closed in one layer with continuous  Suture of one chromic hemostasis satisfactory bladder flap reattached to a chromic lap and sponge counts correct abdomen closed in layers peritoneum continuous within of 0 chromic fascia continuous suture of 0 Vicryl and the skin shows a subcuticular stitch of 4-0 Monocryl blood Lasix 100 cc patient tolerated procedure well

## 2013-01-15 NOTE — Anesthesia Postprocedure Evaluation (Signed)
  Anesthesia Post-op Note  Patient: Erin Bell  Procedure(s) Performed: Procedure(s): CESAREAN SECTION (N/A)  Patient Location: Mother/Baby  Anesthesia Type:Spinal  Level of Consciousness: awake  Airway and Oxygen Therapy: Patient Spontanous Breathing  Post-op Pain: mild  Post-op Assessment: Patient's Cardiovascular Status Stable and Respiratory Function Stable  Post-op Vital Signs: stable  Complications: No apparent anesthesia complications

## 2013-01-15 NOTE — MAU Note (Addendum)
PT HAS ARRIIVED VIA EMS- SAYING UC WORSE. WAS HERE YESTRERDAY- 3 CM.   DENIES HSV  AND MRSA.    ON ARRIVAL - PT TO B-ROOM - VOMITING.

## 2013-01-15 NOTE — Anesthesia Preprocedure Evaluation (Signed)

## 2013-01-15 NOTE — Anesthesia Postprocedure Evaluation (Signed)
  Anesthesia Post-op Note  Patient: Erin Bell  Procedure(s) Performed: Procedure(s): CESAREAN SECTION (N/A)  Patient Location: PACU  Anesthesia Type:Spinal  Level of Consciousness: awake, alert  and oriented  Airway and Oxygen Therapy: Patient Spontanous Breathing  Post-op Pain: none  Post-op Assessment: Post-op Vital signs reviewed, Patient's Cardiovascular Status Stable, Respiratory Function Stable, Patent Airway, No signs of Nausea or vomiting, Pain level controlled, No headache, No backache, No residual numbness and No residual motor weakness  Post-op Vital Signs: Reviewed and stable  Complications: No apparent anesthesia complications

## 2013-01-15 NOTE — Progress Notes (Signed)
Patient ID: Erin Bell, female   DOB: 11/25/1990, 22 y.o.   MRN: [id number] Patient fully dilated and plus one station and at that time discovered to be frank breech she delivered by C-section and

## 2013-01-15 NOTE — Anesthesia Procedure Notes (Signed)

## 2013-01-15 NOTE — Transfer of Care (Signed)
Immediate Anesthesia Transfer of Care Note  Patient: Erin Bell  Procedure(s) Performed: Procedure(s): CESAREAN SECTION (N/A)  Patient Location: PACU  Anesthesia Type:Spinal  Level of Consciousness: awake, alert , oriented and patient cooperative  Airway & Oxygen Therapy: Patient Spontanous Breathing  Post-op Assessment: Report given to PACU RN and Post -op Vital signs reviewed and stable  Post vital signs: Reviewed and stable  Complications: No apparent anesthesia complications

## 2013-01-16 LAB — CBC
HCT: 33.8 % — ABNORMAL LOW (ref 36.0–46.0)
MCHC: 32.8 g/dL (ref 30.0–36.0)
MCV: 87.1 fL (ref 78.0–100.0)
RDW: 15.7 % — ABNORMAL HIGH (ref 11.5–15.5)

## 2013-01-16 NOTE — Progress Notes (Signed)
Patient ID: Erin Bell, female   DOB: Nov 16, 1990, 22 y.o.   MRN: [id number] Postpartum day one Vital signs normal Fundus firm Lochia moderate Doing well

## 2013-01-17 ENCOUNTER — Encounter (HOSPITAL_COMMUNITY)
Admission: AD | Disposition: A | Discharge status: Home / Self Care | Payer: Self-pay | Source: Ambulatory Visit | Attending: Obstetrics

## 2013-01-17 ENCOUNTER — Encounter (HOSPITAL_COMMUNITY): Payer: Self-pay | Admitting: Anesthesiology

## 2013-01-17 ENCOUNTER — Inpatient Hospital Stay (HOSPITAL_COMMUNITY): Payer: Medicaid Other | Admitting: Anesthesiology

## 2013-01-17 SURGERY — Surgical Case
Anesthesia: Spinal

## 2013-01-17 MED ORDER — ONDANSETRON HCL 4 MG/2ML IJ SOLN
INTRAMUSCULAR | Status: AC
  Filled 2013-01-17: qty 2

## 2013-01-17 MED ORDER — MORPHINE SULFATE 0.5 MG/ML IJ SOLN
INTRAMUSCULAR | Status: AC
  Filled 2013-01-17: qty 10

## 2013-01-17 MED ORDER — OXYTOCIN 10 UNIT/ML IJ SOLN
INTRAMUSCULAR | Status: AC
  Filled 2013-01-17: qty 4

## 2013-01-17 MED ORDER — FENTANYL CITRATE 0.05 MG/ML IJ SOLN
INTRAMUSCULAR | Status: AC
  Filled 2013-01-17: qty 2

## 2013-01-17 SURGICAL SUPPLY — 30 items
CLOTH BEACON ORANGE TIMEOUT ST (SAFETY) IMPLANT
DERMABOND ADVANCED (GAUZE/BANDAGES/DRESSINGS)
DERMABOND ADVANCED .7 DNX12 (GAUZE/BANDAGES/DRESSINGS) IMPLANT
DRAPE LG THREE QUARTER DISP (DRAPES) IMPLANT
DRSG OPSITE POSTOP 4X10 (GAUZE/BANDAGES/DRESSINGS) IMPLANT
DURAPREP 26ML APPLICATOR (WOUND CARE) IMPLANT
ELECT REM PT RETURN 9FT ADLT (ELECTROSURGICAL)
ELECTRODE REM PT RTRN 9FT ADLT (ELECTROSURGICAL) IMPLANT
EXTRACTOR VACUUM M CUP 4 TUBE (SUCTIONS) IMPLANT
GLOVE BIO SURGEON STRL SZ8.5 (GLOVE) IMPLANT
GOWN PREVENTION PLUS XXLARGE (GOWN DISPOSABLE) IMPLANT
GOWN STRL REIN XL XLG (GOWN DISPOSABLE) IMPLANT
KIT ABG SYR 3ML LUER SLIP (SYRINGE) IMPLANT
NEEDLE HYPO 25X5/8 SAFETYGLIDE (NEEDLE) IMPLANT
NS IRRIG 1000ML POUR BTL (IV SOLUTION) IMPLANT
PACK C SECTION WH (CUSTOM PROCEDURE TRAY) IMPLANT
PAD OB MATERNITY 4.3X12.25 (PERSONAL CARE ITEMS) IMPLANT
SLEEVE SCD COMPRESS KNEE MED (MISCELLANEOUS) IMPLANT
SUT CHROMIC 0 CT 802H (SUTURE) IMPLANT
SUT CHROMIC 1 CTX 36 (SUTURE) IMPLANT
SUT CHROMIC 2 0 SH (SUTURE) IMPLANT
SUT GUT PLAIN 0 CT-3 TAN 27 (SUTURE) IMPLANT
SUT MON AB 4-0 PS1 27 (SUTURE) IMPLANT
SUT VIC AB 0 CT1 18XCR BRD8 (SUTURE) IMPLANT
SUT VIC AB 0 CT1 8-18 (SUTURE)
SUT VIC AB 0 CTX 36 (SUTURE)
SUT VIC AB 0 CTX36XBRD ANBCTRL (SUTURE) IMPLANT
TOWEL OR 17X24 6PK STRL BLUE (TOWEL DISPOSABLE) IMPLANT
TRAY FOLEY CATH 14FR (SET/KITS/TRAYS/PACK) IMPLANT
WATER STERILE IRR 1000ML POUR (IV SOLUTION) IMPLANT

## 2013-01-17 NOTE — Anesthesia Preprocedure Evaluation (Deleted)
Anesthesia Evaluation  Patient identified by MRN, date of birth, ID band Patient awake    Reviewed: Allergy & Precautions, H&P , NPO status , Patient's Chart, lab work & pertinent test results  Airway Mallampati: I TM Distance: >3 FB Neck ROM: Full    Dental no notable dental hx.    Pulmonary asthma , Current Smoker,  breath sounds clear to auscultation  Pulmonary exam normal       Cardiovascular hypertension, Pt. on medications Rhythm:Regular Rate:Normal     Neuro/Psych negative neurological ROS  negative psych ROS   GI/Hepatic negative GI ROS, Neg liver ROS,   Endo/Other  negative endocrine ROS  Renal/GU negative Renal ROS  negative genitourinary   Musculoskeletal negative musculoskeletal ROS (+)   Abdominal   Peds negative pediatric ROS (+)  Hematology negative hematology ROS (+)   Anesthesia Other Findings   Reproductive/Obstetrics (+) Pregnancy                           Anesthesia Physical Anesthesia Plan  ASA: II and emergent  Anesthesia Plan: Spinal   Post-op Pain Management:    Induction: Intravenous  Airway Management Planned:   Additional Equipment:   Intra-op Plan:   Post-operative Plan: Extubation in OR  Informed Consent: I have reviewed the patients History and Physical, chart, labs and discussed the procedure including the risks, benefits and alternatives for the proposed anesthesia with the patient or authorized representative who has indicated his/her understanding and acceptance.   Dental advisory given  Plan Discussed with: CRNA  Anesthesia Plan Comments:         Anesthesia Quick Evaluation

## 2013-01-18 ENCOUNTER — Encounter (HOSPITAL_COMMUNITY): Payer: Self-pay | Admitting: Obstetrics

## 2013-01-18 NOTE — Progress Notes (Signed)
UR chart review completed.  

## 2013-01-18 NOTE — Discharge Summary (Signed)
Obstetric Discharge Summary Reason for Admission: onset of labor Prenatal Procedures: none Intrapartum Procedures: cesarean: low cervical, transverse Postpartum Procedures: none Complications-Operative and Postpartum: none Hemoglobin  Date Value Range Status  01/16/2013 11.1* 12.0 - 15.0 g/dL Final     HCT  Date Value Range Status  01/16/2013 33.8* 36.0 - 46.0 % Final    Physical Exam:  General: alert Lochia: appropriate Uterine Fundus: firm Incision: healing well DVT Evaluation: No evidence of DVT seen on physical exam.  Discharge Diagnoses: Term Pregnancy-delivered  Discharge Information: Date: 01/18/2013 Activity: pelvic rest Diet: routine Medications: Percocet Condition: stable Instructions: refer to practice specific booklet Discharge to: home Follow-up Information   Follow up with Erin Dimalanta A, MD. Schedule an appointment as soon as possible for Erin [name] visit in 6 weeks.   Contact information:   [address] [place] [address] [phone]       Newborn Data: Live born female  Birth Weight: 6 lb 0.8 oz (2745 g) APGAR: 8, 9  Home with mother.  Erin Erin [name] 01/18/2013, 6:46 AM

## 2013-01-18 NOTE — Lactation Note (Signed)
This note was copied from the chart of Erin [name]. Lactation Consultation Note Mom states she is now pumping and feeding expressed breast milk via bottle. Mom is expressing plenty of breast milk at this time, using the hand pump. Mom states her only concern is the fit of the flange, states the pump is too small. Mom was fitted for a larger flange.  Enc mom to call WIC to get her appt for a pump. Enc mom to call lactation office if she has any concerns, and to attend the BFSG.   Patient Name: Erin Athenia Rys [id number] Date: 01/18/2013 Reason for consult: Follow-up assessment   Maternal Data    Feeding Feeding Type: Breast Milk Feeding method: Bottle Nipple Type: Slow - flow Length of feed: 10 min  LATCH Score/Interventions                      Lactation Tools Discussed/Used WIC Program: Yes   Consult Status Consult Status: Complete    Lenard Forth 01/18/2013, 9:25 AM

## 2013-03-11 ENCOUNTER — Encounter (HOSPITAL_COMMUNITY): Payer: Self-pay | Admitting: *Deleted

## 2013-03-11 ENCOUNTER — Emergency Department (HOSPITAL_COMMUNITY)
Admission: EM | Admit: 2013-03-11 | Discharge: 2013-03-11 | Disposition: A | Discharge status: Home / Self Care | Payer: Medicaid Other | Attending: Emergency Medicine | Admitting: Emergency Medicine

## 2013-03-11 DIAGNOSIS — R21 Rash and other nonspecific skin eruption: Secondary | ICD-10-CM | POA: Insufficient documentation

## 2013-03-11 DIAGNOSIS — Z8619 Personal history of other infectious and parasitic diseases: Secondary | ICD-10-CM | POA: Insufficient documentation

## 2013-03-11 DIAGNOSIS — Z87891 Personal history of nicotine dependence: Secondary | ICD-10-CM | POA: Insufficient documentation

## 2013-03-11 MED ORDER — TRIAMCINOLONE ACETONIDE 0.025 % EX OINT: TOPICAL_OINTMENT | Freq: Two times a day (BID) | CUTANEOUS | Status: DC

## 2013-03-11 NOTE — ED Provider Notes (Signed)
Medical screening examination/treatment/procedure(s) were performed by non-physician practitioner and as supervising physician I was immediately available for consultation/collaboration.   Richardean Canal, MD 03/11/13 [id number]

## 2013-03-11 NOTE — ED Provider Notes (Signed)
History  This chart was scribed for non-physician practitioner Arthor Captain, PA-C working with Richardean Canal, MD, by Candelaria Stagers, ED Scribe. This patient was seen in room WTR9/WTR9 and the patient's care was started at 7:20 PM   CSN: [id number]  Arrival date & time 03/11/13  1650   First MD Initiated Contact with Patient 03/11/13 1723      Chief Complaint  Patient presents with  . Rash    The history is provided by the patient. No language interpreter was used.   HPI Comments: Erin Bell is a 22 y.o. female who presents to the Emergency Department complaining of an itching rash that started about one week ago to her upper arms and bilateral lower legs.  Pt denies fever, chills, nausea, or vomiting.  She lives with other people who are experiencing a similar rash.  She has applied hydrocortisone cream with no relief.  Pt denies changing any soaps or detergents recently.    Past Medical History  Diagnosis Date  . No pertinent past medical history   . Chlamydia     Past Surgical History  Procedure Laterality Date  . Nose surgery    . Wisdom tooth extraction    . Cesarean section N/A 01/15/2013    Procedure: CESAREAN SECTION;  Surgeon: Kathreen Cosier, MD;  Location: [facility];  Service: Obstetrics;  Laterality: N/A;    Family History  Problem Relation Age of Onset  . Other Neg Hx   . Alcohol abuse Neg Hx   . Hypertension Neg Hx   . Hyperlipidemia Neg Hx   . Heart disease Neg Hx   . Hearing loss Neg Hx   . Early death Neg Hx   . Drug abuse Neg Hx   . Miscarriages / Stillbirths Neg Hx   . Mental retardation Neg Hx   . Mental illness Neg Hx   . Learning disabilities Neg Hx   . Kidney disease Neg Hx   . Stroke Neg Hx   . Vision loss Neg Hx   . Arthritis Father   . Birth defects Sister     spina bifida  . Asthma Brother   . Diabetes Maternal Uncle     History  Substance Use Topics  . Smoking status: Former Smoker    Quit date: 05/03/2012  . Smokeless  tobacco: Never Used  . Alcohol Use: No     Comment: occasional    OB History   Grav Para Term Preterm Abortions TAB SAB Ect Mult Living   1 1 1  0 0 0 0 0 0 1      Review of Systems  Skin: Positive for rash.  All other systems reviewed and are negative.    Allergies  Review of patient's allergies indicates no known allergies.  Home Medications   Current Outpatient Rx  Name  Route  Sig  Dispense  Refill  . Prenatal Vit-Fe Fumarate-FA (PRENATAL MULTIVITAMIN) TABS   Oral   Take 1 tablet by mouth daily at 12 noon.           BP 111/75  Pulse 60  Temp(Src) 98.1 F (36.7 C) (Oral)  Resp 20  SpO2 100%  Physical Exam  Nursing note and vitals reviewed. Constitutional: She is oriented to person, place, and time. She appears well-developed and well-nourished. No distress.  HENT:  Head: Normocephalic and atraumatic.  Eyes: EOM are normal.  Neck: Neck supple. No tracheal deviation present.  Cardiovascular: Normal rate.   Pulmonary/Chest: Effort normal.  No respiratory distress.  Musculoskeletal: Normal range of motion.  Neurological: She is alert and oriented to person, place, and time.  Skin: Skin is warm and dry.  Multiple wheels, singular, c/o insect bites.  No signs of infection.   Psychiatric: She has a normal mood and affect. Her behavior is normal.    ED Course  Procedures   DIAGNOSTIC STUDIES: Oxygen Saturation is 100% on room air, normal by my interpretation.    COORDINATION OF CARE:  7:23 PM Discussed course of care with pt. Pt understands and agrees.   Labs Reviewed - No data to display No results found.   1. Rash       MDM   Patient rash consistent with insect bites.  No concern for allergy or scabies. No infection. D/s with kenalog  I personally performed the services described in this documentation, which was scribed in my presence. The recorded information has been reviewed and is accurate.         Arthor Captain, PA-C 03/11/13  1937

## 2013-03-11 NOTE — ED Notes (Signed)
Pt reports red itchy raised hives/rash  Which she noticed today.  Pt denies fever or chills at this time.  Pt reports her mother recently reported that she is allergic to cockroach and has been noticing them in the house.  Pt lives with someone who is being seen for same at present.

## 2013-12-01 ENCOUNTER — Encounter (HOSPITAL_COMMUNITY): Payer: Self-pay | Admitting: *Deleted

## 2013-12-01 ENCOUNTER — Inpatient Hospital Stay (HOSPITAL_COMMUNITY)
Admission: AD | Admit: 2013-12-01 | Discharge: 2013-12-01 | Disposition: A | Discharge status: Home / Self Care | Payer: Medicaid Other | Source: Ambulatory Visit | Attending: Obstetrics & Gynecology | Admitting: Obstetrics & Gynecology

## 2013-12-01 DIAGNOSIS — R1084 Generalized abdominal pain: Secondary | ICD-10-CM

## 2013-12-01 DIAGNOSIS — R238 Other skin changes: Secondary | ICD-10-CM

## 2013-12-01 DIAGNOSIS — N760 Acute vaginitis: Secondary | ICD-10-CM | POA: Insufficient documentation

## 2013-12-01 DIAGNOSIS — A499 Bacterial infection, unspecified: Secondary | ICD-10-CM | POA: Insufficient documentation

## 2013-12-01 DIAGNOSIS — B9689 Other specified bacterial agents as the cause of diseases classified elsewhere: Secondary | ICD-10-CM | POA: Insufficient documentation

## 2013-12-01 DIAGNOSIS — F17200 Nicotine dependence, unspecified, uncomplicated: Secondary | ICD-10-CM | POA: Insufficient documentation

## 2013-12-01 DIAGNOSIS — R109 Unspecified abdominal pain: Secondary | ICD-10-CM | POA: Insufficient documentation

## 2013-12-01 LAB — CBC
HEMATOCRIT: 36.6 % (ref 36.0–46.0)
HEMOGLOBIN: 12.5 g/dL (ref 12.0–15.0)
MCH: 26.3 pg (ref 26.0–34.0)
MCHC: 34.2 g/dL (ref 30.0–36.0)
MCV: 76.9 fL — ABNORMAL LOW (ref 78.0–100.0)
Platelets: 280 10*3/uL (ref 150–400)
RBC: 4.76 MIL/uL (ref 3.87–5.11)
RDW: 14.2 % (ref 11.5–15.5)
WBC: 9.2 10*3/uL (ref 4.0–10.5)

## 2013-12-01 LAB — POCT PREGNANCY, URINE: Preg Test, Ur: NEGATIVE

## 2013-12-01 LAB — URINALYSIS, ROUTINE W REFLEX MICROSCOPIC
Bilirubin Urine: NEGATIVE
Glucose, UA: NEGATIVE mg/dL
Hgb urine dipstick: NEGATIVE
KETONES UR: NEGATIVE mg/dL
LEUKOCYTES UA: NEGATIVE
NITRITE: NEGATIVE
PROTEIN: NEGATIVE mg/dL
Specific Gravity, Urine: 1.01 (ref 1.005–1.030)
Urobilinogen, UA: 1 mg/dL (ref 0.0–1.0)
pH: 8 (ref 5.0–8.0)

## 2013-12-01 LAB — WET PREP, GENITAL
Trich, Wet Prep: NONE SEEN
YEAST WET PREP: NONE SEEN

## 2013-12-01 MED ORDER — METRONIDAZOLE 500 MG PO TABS: 500.0000 mg | ORAL_TABLET | Freq: Two times a day (BID) | ORAL | Status: AC

## 2013-12-01 NOTE — MAU Provider Note (Signed)
History     CSN: [id number]  Arrival date and time: 12/01/13 1622   None     Chief Complaint  Patient presents with  . Abdominal Pain  . Vaginal Discharge  . Possible Pregnancy   HPI This is a 23 y.o. female who presents with c/o intermittent pain in upper and lower abdomen for a day or so. Denies fever, nausea, vomiting, diarrhea or constipation. Has an appt with Dr Bruna PotterBlount tomorrow. Last Depo Provera was in summer of 2014 and has not had menses since. Thinks she is pregnant.   RN Note:  Sometimes has pain in left upper abd, , earlier had pain in lower abd. Comes and goes.        OB History   Grav Para Term Preterm Abortions TAB SAB Ect Mult Living   1 1 1  0 0 0 0 0 0 1      Past Medical History  Diagnosis Date  . No pertinent past medical history   . Chlamydia     Past Surgical History  Procedure Laterality Date  . Nose surgery    . Wisdom tooth extraction    . Cesarean section N/A 01/15/2013    Procedure: CESAREAN SECTION;  Surgeon: Kathreen CosierBernard A Marshall, MD;  Location: [facility];  Service: Obstetrics;  Laterality: N/A;    Family History  Problem Relation Age of Onset  . Other Neg Hx   . Alcohol abuse Neg Hx   . Hypertension Neg Hx   . Hyperlipidemia Neg Hx   . Heart disease Neg Hx   . Hearing loss Neg Hx   . Early death Neg Hx   . Drug abuse Neg Hx   . Miscarriages / Stillbirths Neg Hx   . Mental retardation Neg Hx   . Mental illness Neg Hx   . Learning disabilities Neg Hx   . Kidney disease Neg Hx   . Stroke Neg Hx   . Vision loss Neg Hx   . Arthritis Father   . Birth defects Sister     spina bifida  . Asthma Brother   . Diabetes Maternal Uncle     History  Substance Use Topics  . Smoking status: Current Every Day Smoker -- 0.25 packs/day    Types: Cigarettes  . Smokeless tobacco: Never Used  . Alcohol Use: No     Comment: occasional    Allergies: No Known Allergies  Prescriptions prior to admission  Medication Sig Dispense Refill  .  Prenatal Vit-Fe Fumarate-FA (PRENATAL MULTIVITAMIN) TABS Take 1 tablet by mouth daily at 12 noon.      . triamcinolone (KENALOG) 0.025 % ointment Apply topically 2 (two) times daily. Do not apply to face  15 g  1    Review of Systems  Constitutional: Negative for fever, chills and malaise/fatigue.  Gastrointestinal: Positive for abdominal pain. Negative for nausea, vomiting, diarrhea and constipation.  Genitourinary: Negative for dysuria.  Neurological: Negative for dizziness, weakness and headaches.   Physical Exam   Blood pressure 109/83, pulse 129, temperature 99.2 F (37.3 C), temperature source Oral, resp. rate 16, height 5' (1.524 m), weight 48.988 kg (108 lb), last menstrual period 09/06/2013, unknown if currently breastfeeding.  Physical Exam  Constitutional: She is oriented to person, place, and time. She appears well-developed and well-nourished. No distress.  HENT:  Head: Normocephalic.  Cardiovascular: Normal rate.   Respiratory: Effort normal.  GI: Soft. She exhibits no distension and no mass. There is tenderness (mild diffuse tenderness, uterus slightly tender  on palpation, adnexae nontender bilaterally). There is no rebound and no guarding.  Genitourinary: Vagina normal and uterus normal. No vaginal discharge found.  Musculoskeletal: Normal range of motion.  Neurological: She is alert and oriented to person, place, and time.  Skin: Skin is warm and dry.  Psychiatric: She has a normal mood and affect.    MAU Course  Procedures  MDM Results for orders placed during the hospital encounter of 12/01/13 (from the past 24 hour(s))  URINALYSIS, ROUTINE W REFLEX MICROSCOPIC     Status: None   Collection Time    12/01/13  4:50 PM      Result Value Ref Range   Color, Urine YELLOW  YELLOW   APPearance CLEAR  CLEAR   Specific Gravity, Urine 1.010  1.005 - 1.030   pH 8.0  5.0 - 8.0   Glucose, UA NEGATIVE  NEGATIVE mg/dL   Hgb urine dipstick NEGATIVE  NEGATIVE   Bilirubin  Urine NEGATIVE  NEGATIVE   Ketones, ur NEGATIVE  NEGATIVE mg/dL   Protein, ur NEGATIVE  NEGATIVE mg/dL   Urobilinogen, UA 1.0  0.0 - 1.0 mg/dL   Nitrite NEGATIVE  NEGATIVE   Leukocytes, UA NEGATIVE  NEGATIVE  POCT PREGNANCY, URINE     Status: None   Collection Time    12/01/13  4:52 PM      Result Value Ref Range   Preg Test, Ur NEGATIVE  NEGATIVE  WET PREP, GENITAL     Status: Abnormal   Collection Time    12/01/13  5:09 PM      Result Value Ref Range   Yeast Wet Prep HPF POC NONE SEEN  NONE SEEN   Trich, Wet Prep NONE SEEN  NONE SEEN   Clue Cells Wet Prep HPF POC FEW (*) NONE SEEN   WBC, Wet Prep HPF POC FEW (*) NONE SEEN  CBC     Status: Abnormal   Collection Time    12/01/13  5:20 PM      Result Value Ref Range   WBC 9.2  4.0 - 10.5 K/uL   RBC 4.76  3.87 - 5.11 MIL/uL   Hemoglobin 12.5  12.0 - 15.0 g/dL   HCT 91.4  78.2 - 95.6 %   MCV 76.9 (*) 78.0 - 100.0 fL   MCH 26.3  26.0 - 34.0 pg   MCHC 34.2  30.0 - 36.0 g/dL   RDW 21.3  08.6 - 57.8 %   Platelets 280  150 - 400 K/uL     Assessment and Plan  A:  Abdominal pain, unknown origin      Nontoxic, no evidence of acute illness      Not pregnant      Mild BV  P:  Desires tx for BV       Rx Flagyl       Recommend follow up with family doctor  [facility] 12/01/2013, 4:56 PM

## 2013-12-01 NOTE — MAU Provider Note (Signed)
Attestation of Attending Supervision of Advanced Practitioner: Evaluation and management procedures were performed by the PA/NP/CNM/OB Fellow under my supervision/collaboration. Chart reviewed and agree with management and plan.  Renold Kozar V 12/01/2013 9:34 PM

## 2013-12-01 NOTE — MAU Note (Signed)
Sometimes has pain in left upper abd, , earlier had pain in lower abd.  Comes and goes.

## 2013-12-01 NOTE — Discharge Instructions (Signed)
Abdominal Pain, Women °Abdominal (stomach, pelvic, or belly) pain can be caused by many things. It is important to tell your doctor: °· The location of the pain. °· Does it come and go or is it present all the time? °· Are there things that start the pain (eating certain foods, exercise)? °· Are there other symptoms associated with the pain (fever, nausea, vomiting, diarrhea)? °All of this is helpful to know when trying to find the cause of the pain. °CAUSES  °· Stomach: virus or bacteria infection, or ulcer. °· Intestine: appendicitis (inflamed appendix), regional ileitis (Crohn's disease), ulcerative colitis (inflamed colon), irritable bowel syndrome, diverticulitis (inflamed diverticulum of the colon), or cancer of the stomach or intestine. °· Gallbladder disease or stones in the gallbladder. °· Kidney disease, kidney stones, or infection. °· Pancreas infection or cancer. °· Fibromyalgia (pain disorder). °· Diseases of the female organs: °· Uterus: fibroid (non-cancerous) tumors or infection. °· Fallopian tubes: infection or tubal pregnancy. °· Ovary: cysts or tumors. °· Pelvic adhesions (scar tissue). °· Endometriosis (uterus lining tissue growing in the pelvis and on the pelvic organs). °· Pelvic congestion syndrome (female organs filling up with blood just before the menstrual period). °· Pain with the menstrual period. °· Pain with ovulation (producing an egg). °· Pain with an IUD (intrauterine device, birth control) in the uterus. °· Cancer of the female organs. °· Functional pain (pain not caused by a disease, may improve without treatment). °· Psychological pain. °· Depression. °DIAGNOSIS  °Your doctor will decide the seriousness of your pain by doing an examination. °· Blood tests. °· X-rays. °· Ultrasound. °· CT scan (computed tomography, special type of X-ray). °· MRI (magnetic resonance imaging). °· Cultures, for infection. °· Barium enema (dye inserted in the large intestine, to better view it with  X-rays). °· Colonoscopy (looking in intestine with a lighted tube). °· Laparoscopy (minor surgery, looking in abdomen with a lighted tube). °· Major abdominal exploratory surgery (looking in abdomen with a large incision). °TREATMENT  °The treatment will depend on the cause of the pain.  °· Many cases can be observed and treated at home. °· Over-the-counter medicines recommended by your caregiver. °· Prescription medicine. °· Antibiotics, for infection. °· Birth control pills, for painful periods or for ovulation pain. °· Hormone treatment, for endometriosis. °· Nerve blocking injections. °· Physical therapy. °· Antidepressants. °· Counseling with a psychologist or psychiatrist. °· Minor or major surgery. °HOME CARE INSTRUCTIONS  °· Do not take laxatives, unless directed by your caregiver. °· Take over-the-counter pain medicine only if ordered by your caregiver. Do not take aspirin because it can cause an upset stomach or bleeding. °· Try a clear liquid diet (broth or water) as ordered by your caregiver. Slowly move to a bland diet, as tolerated, if the pain is related to the stomach or intestine. °· Have a thermometer and take your temperature several times a day, and record it. °· Bed rest and sleep, if it helps the pain. °· Avoid sexual intercourse, if it causes pain. °· Avoid stressful situations. °· Keep your follow-up appointments and tests, as your caregiver orders. °· If the pain does not go away with medicine or surgery, you may try: °· Acupuncture. °· Relaxation exercises (yoga, meditation). °· Group therapy. °· Counseling. °SEEK MEDICAL CARE IF:  °· You notice certain foods cause stomach pain. °· Your home care treatment is not helping your pain. °· You need stronger pain medicine. °· You want your IUD removed. °· You feel faint or   lightheaded. °· You develop nausea and vomiting. °· You develop a rash. °· You are having side effects or an allergy to your medicine. °SEEK IMMEDIATE MEDICAL CARE IF:  °· Your  pain does not go away or gets worse. °· You have a fever. °· Your pain is felt only in portions of the abdomen. The right side could possibly be appendicitis. The left lower portion of the abdomen could be colitis or diverticulitis. °· You are passing blood in your stools (bright red or black tarry stools, with or without vomiting). °· You have blood in your urine. °· You develop chills, with or without a fever. °· You pass out. °MAKE SURE YOU:  °· Understand these instructions. °· Will watch your condition. °· Will get help right away if you are not doing well or get worse. °Document Released: 07/14/2007 Document Revised: 12/09/2011 Document Reviewed: 08/03/2009 °ExitCare® Patient Information ©2014 [employer]. ° °Bacterial Vaginosis °Bacterial vaginosis is a vaginal infection that occurs when the normal balance of bacteria in the vagina is disrupted. It results from an overgrowth of certain bacteria. This is the most common vaginal infection in women of childbearing age. Treatment is important to prevent complications, especially in pregnant women, as it can cause a premature delivery. °CAUSES  °Bacterial vaginosis is caused by an increase in harmful bacteria that are normally present in smaller amounts in the vagina. Several different kinds of bacteria can cause bacterial vaginosis. However, the reason that the condition develops is not fully understood. °RISK FACTORS °Certain activities or behaviors can put you at an increased risk of developing bacterial vaginosis, including: °· Having a new sex partner or multiple sex partners. °· Douching. °· Using an intrauterine device (IUD) for contraception. °Women do not get bacterial vaginosis from toilet seats, bedding, swimming pools, or contact with objects around them. °SIGNS AND SYMPTOMS  °Some women with bacterial vaginosis have no signs or symptoms. Common symptoms include: °· Grey vaginal discharge. °· A fishlike odor with discharge, especially after sexual  intercourse. °· Itching or burning of the vagina and vulva. °· Burning or pain with urination. °DIAGNOSIS  °Your health care provider will take a medical history and examine the vagina for signs of bacterial vaginosis. A sample of vaginal fluid may be taken. Your health care provider will look at this sample under a microscope to check for bacteria and abnormal cells. A vaginal pH test may also be done.  °TREATMENT  °Bacterial vaginosis may be treated with antibiotic medicines. These may be given in the form of a pill or a vaginal cream. A second round of antibiotics may be prescribed if the condition comes back after treatment.  °HOME CARE INSTRUCTIONS  °· Only take over-the-counter or prescription medicines as directed by your health care provider. °· If antibiotic medicine was prescribed, take it as directed. Make sure you finish it even if you start to feel better. °· Do not have sex until treatment is completed. °· Tell all sexual partners that you have a vaginal infection. They should see their health care provider and be treated if they have problems, such as a mild rash or itching. °· Practice safe sex by using condoms and only having one sex partner. °SEEK MEDICAL CARE IF:  °· Your symptoms are not improving after 3 days of treatment. °· You have increased discharge or pain. °· You have a fever. °MAKE SURE YOU:  °· Understand these instructions. °· Will watch your condition. °· Will get help right away if you   are not doing well or get worse. °FOR MORE INFORMATION  °Centers for Disease Control and Prevention, Division of STD Prevention: www.cdc.gov/std °American Sexual Health Association (ASHA): www.ashastd.org  °Document Released: 09/16/2005 Document Revised: 07/07/2013 Document Reviewed: 04/28/2013 °ExitCare® Patient Information ©2014 [employer]. ° °

## 2013-12-02 LAB — GC/CHLAMYDIA PROBE AMP
CT Probe RNA: NEGATIVE
GC Probe RNA: NEGATIVE

## 2014-02-24 ENCOUNTER — Other Ambulatory Visit: Payer: Self-pay | Admitting: Family Medicine

## 2014-02-24 DIAGNOSIS — R7989 Other specified abnormal findings of blood chemistry: Secondary | ICD-10-CM

## 2014-03-08 ENCOUNTER — Other Ambulatory Visit: Payer: Self-pay | Admitting: Family Medicine

## 2014-03-08 ENCOUNTER — Encounter (HOSPITAL_COMMUNITY)
Admission: RE | Admit: 2014-03-08 | Discharge: 2014-03-08 | Disposition: A | Discharge status: Home / Self Care | Payer: Medicaid Other | Source: Ambulatory Visit | Attending: Family Medicine | Admitting: Family Medicine

## 2014-03-08 DIAGNOSIS — R7989 Other specified abnormal findings of blood chemistry: Secondary | ICD-10-CM

## 2014-03-08 DIAGNOSIS — R946 Abnormal results of thyroid function studies: Secondary | ICD-10-CM | POA: Insufficient documentation

## 2014-03-08 DIAGNOSIS — E0590 Thyrotoxicosis, unspecified without thyrotoxic crisis or storm: Secondary | ICD-10-CM | POA: Insufficient documentation

## 2014-03-08 DIAGNOSIS — E049 Nontoxic goiter, unspecified: Secondary | ICD-10-CM

## 2014-03-08 MED ORDER — SODIUM IODIDE I 131 CAPSULE
15.1000 | Freq: Once | INTRAVENOUS | Status: AC | PRN
  Administered 2014-03-08: 15.1 via ORAL

## 2014-03-09 ENCOUNTER — Encounter (HOSPITAL_COMMUNITY)
Admission: RE | Admit: 2014-03-09 | Discharge: 2014-03-09 | Disposition: A | Discharge status: Home / Self Care | Payer: Medicaid Other | Source: Ambulatory Visit | Attending: Family Medicine | Admitting: Family Medicine

## 2014-03-09 DIAGNOSIS — E0590 Thyrotoxicosis, unspecified without thyrotoxic crisis or storm: Secondary | ICD-10-CM | POA: Insufficient documentation

## 2014-03-09 DIAGNOSIS — R946 Abnormal results of thyroid function studies: Secondary | ICD-10-CM | POA: Insufficient documentation

## 2014-03-09 DIAGNOSIS — E049 Nontoxic goiter, unspecified: Secondary | ICD-10-CM | POA: Insufficient documentation

## 2014-03-09 MED ORDER — SODIUM PERTECHNETATE TC 99M INJECTION: 10.0000 | Freq: Once | INTRAVENOUS | Status: AC | PRN

## 2014-03-10 ENCOUNTER — Other Ambulatory Visit: Payer: Self-pay | Admitting: Family Medicine

## 2014-03-10 DIAGNOSIS — E0590 Thyrotoxicosis, unspecified without thyrotoxic crisis or storm: Secondary | ICD-10-CM

## 2014-03-18 ENCOUNTER — Encounter: Payer: Self-pay | Admitting: Endocrinology

## 2014-03-18 ENCOUNTER — Ambulatory Visit (INDEPENDENT_AMBULATORY_CARE_PROVIDER_SITE_OTHER): Payer: Medicaid Other | Admitting: Endocrinology

## 2014-03-18 VITALS — BP 110/60 | HR 90 | Temp 97.7°F | Resp 14 | Ht 62.0 in | Wt 102.0 lb

## 2014-03-18 DIAGNOSIS — O99283 Endocrine, nutritional and metabolic diseases complicating pregnancy, third trimester: Secondary | ICD-10-CM | POA: Insufficient documentation

## 2014-03-18 DIAGNOSIS — Z8639 Personal history of other endocrine, nutritional and metabolic disease: Secondary | ICD-10-CM | POA: Insufficient documentation

## 2014-03-18 DIAGNOSIS — E039 Hypothyroidism, unspecified: Secondary | ICD-10-CM | POA: Insufficient documentation

## 2014-03-18 DIAGNOSIS — E0590 Thyrotoxicosis, unspecified without thyrotoxic crisis or storm: Secondary | ICD-10-CM

## 2014-03-18 HISTORY — DX: Personal history of other endocrine, nutritional and metabolic disease: Z86.39

## 2014-03-18 LAB — T4, FREE: FREE T4: 3.12 ng/dL — AB (ref 0.60–1.60)

## 2014-03-18 LAB — TSH: TSH: 0.02 u[IU]/mL — AB (ref 0.35–5.50)

## 2014-03-18 MED ORDER — METHIMAZOLE 10 MG PO TABS: 10.0000 mg | ORAL_TABLET | Freq: Every day | ORAL | Status: DC

## 2014-03-18 NOTE — Progress Notes (Signed)
Patient ID: Erin Bell, female   DOB: 03-20-1991, 23 y.o.   MRN: [id number]                                                                                                               Reason for Appointment:  Hyperthyroidism, new consultation  Referring physician: BLOUNT   History of Present Illness:   The patient is a poor historian and does not give any clear history about her symptoms Not clear why she had seen her primary care physician recently She thinks she had been having some loss of appetite and probably some weight loss but she cannot pinpoint when this started She says her normal weight is about  115 pounds After her pregnancy last year she weighed about 150 pounds  She thinks her loss of appetite is slightly better recently. For about the last month she has had some feeling of palpitations and tiredness but no muscle weakness Does not think she has any shakiness of her hands but occasionally her legs may shake She does feel warm but not sweaty. She has no unusual nervousness or anxiety Loss of appetite    The patient was evaluated with thyroid function tests which are not available at this time Also had an I-131 uptake done which was 76% She was started on metoprolol about 3 weeks ago and referred here for further management     Medication List       This list is accurate as of: 03/18/14 11:59 PM.  Always use your most recent med list.               medroxyPROGESTERone 150 MG/ML injection  Commonly known as:  DEPO-PROVERA  Inject 150 mg into the muscle every 3 (three) months.     methimazole 10 MG tablet  Commonly known as:  TAPAZOLE  Take 1 tablet (10 mg total) by mouth daily.     metoprolol tartrate 25 MG tablet  Commonly known as:  LOPRESSOR  Take 25 mg by mouth 2 (two) times daily.            Past Medical History  Diagnosis Date  . No pertinent past medical history   . Chlamydia   . Depression     Previously treated with Risperdal     Past Surgical History  Procedure Laterality Date  . Nose surgery    . Wisdom tooth extraction    . Cesarean section N/A 01/15/2013    Procedure: CESAREAN SECTION;  Surgeon: Kathreen CosierBernard A Marshall, MD;  Location: [facility];  Service: Obstetrics;  Laterality: N/A;    Family History  Problem Relation Age of Onset  . Other Neg Hx   . Alcohol abuse Neg Hx   . Hypertension Neg Hx   . Hyperlipidemia Neg Hx   . Heart disease Neg Hx   . Hearing loss Neg Hx   . Early death Neg Hx   . Drug abuse Neg Hx   . Miscarriages / Stillbirths Neg Hx   . Mental retardation Neg  Hx   . Mental illness Neg Hx   . Learning disabilities Neg Hx   . Kidney disease Neg Hx   . Stroke Neg Hx   . Vision loss Neg Hx   . Arthritis Father   . Birth defects Sister     spina bifida  . Asthma Brother   . Diabetes Maternal Uncle   . Thyroid disease Mother     Social History:  reports that she has been smoking Cigarettes.  She has been smoking about 0.25 packs per day. She has never used smokeless tobacco. She reports that she does not drink alcohol or use illicit drugs.  Allergies: No Known Allergies  Review of Systems:  Has no  history of high blood pressure.       No history of dyspnea on exertion.      No complaints of change in bowel habits.      Negative  history of Diabetes.     No swelling of feet      She has been on Depo-Provera shots since her eighth grade and has not had any menstrual cycles because of this Currently not breast-feeding   Examination:   BP 110/60  Pulse 90  Temp(Src) 97.7 F (36.5 C)  Resp 14  Ht 5\' 2"  (1.575 m)  Wt 102 lb (46.267 kg)  BMI 18.65 kg/m2  SpO2 99%  LMP 02/07/2014   General Appearance:  she is thinly built and averagely nourished, pleasant, not anxious. Mildly restless        Eyes: No excessive prominence, lid lag or stare. No swelling of the eyelids  Neck: The thyroid is enlarged about twice normal, smooth, non-tender and diffuse.  There is no  lymphadenopathy .          Heart: normal S1 and S2, no murmurs .         Lungs: breath sounds are clear bilaterally  Extremities: hands are warm but not diaphoretic.  No ankle edema. No skin changes on lower legs Neurological: REFLEXES: at biceps are  hyperactive .  TREMORS:  mild left hand tremors are present on outstretched hands   Assessment/Plan:   Hyperthyroidism, Likely to be from Graves' disease    Discussed with the patient the causation of hyperthyroidism as being autoimmune thyroid disease  Explained the options for treatment including antithyroid drugs and radioactive iodine. Discussed the pros and cons for each treatment: Antithyroid drugs would be reasonable for mild disease but would need frequent followup and monitoring as well as potential for side effects from the medications and uncertainty about long-term cure of the problem Discussed that I-131 treatment is safe and simple to do but will result in long-term hypothyroidism that will result from I-131 treatment and the need for lifelong supplementation Patient understands the above discussion and treatment options.   She feels that she will be unable to comply with medications regularly and for now prefers to do the antithyroid drugs and does not want to consider I-131 treatment which would lead to permanent hypothyroidism Thyroid functions need to be done today to establish a baseline. She was started on methimazole 10 mg once a day and the dose will be adjusted based on her lab She can continue metoprolol  Emphasized the need for regular followup on antithyroid drugs, needs to be scheduled for 3 weeks    Xxavier Noon 03/19/2014, 5:46 PM   Addendum: Labs as follows, will have her continue 10 mg methimazole  Office Visit on 03/18/2014  Component Date Value  Ref Range Status  . TSH 03/18/2014 0.02* 0.35 - 5.50 uIU/mL Final  . Free T4 03/18/2014 3.12* 0.60 - 1.60 ng/dL Final

## 2014-03-18 NOTE — Patient Instructions (Signed)
Take Metoprolol for 2 weeks then stop   Methimazole 1 daily

## 2014-03-19 ENCOUNTER — Encounter: Payer: Self-pay | Admitting: Endocrinology

## 2014-04-08 ENCOUNTER — Other Ambulatory Visit (INDEPENDENT_AMBULATORY_CARE_PROVIDER_SITE_OTHER): Payer: Medicaid Other

## 2014-04-08 DIAGNOSIS — E0590 Thyrotoxicosis, unspecified without thyrotoxic crisis or storm: Secondary | ICD-10-CM

## 2014-04-08 LAB — T3, FREE: T3 FREE: 3 pg/mL (ref 2.3–4.2)

## 2014-04-08 LAB — T4, FREE: Free T4: 0.65 ng/dL (ref 0.60–1.60)

## 2014-04-13 ENCOUNTER — Ambulatory Visit: Payer: Medicaid Other | Admitting: Endocrinology

## 2014-08-01 ENCOUNTER — Encounter: Payer: Self-pay | Admitting: Endocrinology

## 2014-10-13 ENCOUNTER — Encounter (HOSPITAL_COMMUNITY): Payer: Self-pay | Admitting: Obstetrics

## 2015-03-09 ENCOUNTER — Encounter (HOSPITAL_COMMUNITY): Payer: Self-pay | Admitting: Obstetrics

## 2015-07-13 ENCOUNTER — Inpatient Hospital Stay (HOSPITAL_COMMUNITY)
Admission: AD | Admit: 2015-07-13 | Discharge: 2015-07-13 | Disposition: A | Discharge status: Home / Self Care | Payer: Self-pay | Source: Ambulatory Visit | Attending: Obstetrics & Gynecology | Admitting: Obstetrics & Gynecology

## 2015-07-13 ENCOUNTER — Encounter (HOSPITAL_COMMUNITY): Payer: Self-pay | Admitting: *Deleted

## 2015-07-13 DIAGNOSIS — N730 Acute parametritis and pelvic cellulitis: Secondary | ICD-10-CM

## 2015-07-13 DIAGNOSIS — R109 Unspecified abdominal pain: Secondary | ICD-10-CM | POA: Insufficient documentation

## 2015-07-13 LAB — URINALYSIS, ROUTINE W REFLEX MICROSCOPIC
BILIRUBIN URINE: NEGATIVE
Glucose, UA: NEGATIVE mg/dL
Ketones, ur: NEGATIVE mg/dL
Leukocytes, UA: NEGATIVE
Nitrite: NEGATIVE
Protein, ur: NEGATIVE mg/dL
SPECIFIC GRAVITY, URINE: 1.025 (ref 1.005–1.030)
Urobilinogen, UA: 0.2 mg/dL (ref 0.0–1.0)
pH: 6 (ref 5.0–8.0)

## 2015-07-13 LAB — WET PREP, GENITAL
TRICH WET PREP: NONE SEEN
YEAST WET PREP: NONE SEEN

## 2015-07-13 LAB — CBC
HCT: 42 % (ref 36.0–46.0)
HEMOGLOBIN: 14.4 g/dL (ref 12.0–15.0)
MCH: 30.1 pg (ref 26.0–34.0)
MCHC: 34.3 g/dL (ref 30.0–36.0)
MCV: 87.7 fL (ref 78.0–100.0)
PLATELETS: 308 10*3/uL (ref 150–400)
RBC: 4.79 MIL/uL (ref 3.87–5.11)
RDW: 14.4 % (ref 11.5–15.5)
WBC: 16.4 10*3/uL — ABNORMAL HIGH (ref 4.0–10.5)

## 2015-07-13 LAB — URINE MICROSCOPIC-ADD ON

## 2015-07-13 LAB — POCT PREGNANCY, URINE: Preg Test, Ur: NEGATIVE

## 2015-07-13 MED ORDER — AZITHROMYCIN 500 MG PO TABS: 1000.0000 mg | ORAL_TABLET | Freq: Once | ORAL | Status: DC

## 2015-07-13 MED ORDER — AZITHROMYCIN 250 MG PO TABS
1000.0000 mg | ORAL_TABLET | Freq: Once | ORAL | Status: AC
  Administered 2015-07-13: 1000 mg via ORAL
  Filled 2015-07-13: qty 4

## 2015-07-13 MED ORDER — MEDROXYPROGESTERONE ACETATE 150 MG/ML IM SUSP
150.0000 mg | Freq: Once | INTRAMUSCULAR | Status: AC
  Administered 2015-07-13: 150 mg via INTRAMUSCULAR
  Filled 2015-07-13: qty 1

## 2015-07-13 MED ORDER — CEFTRIAXONE SODIUM 250 MG IJ SOLR
250.0000 mg | Freq: Once | INTRAMUSCULAR | Status: AC
  Administered 2015-07-13: 250 mg via INTRAMUSCULAR
  Filled 2015-07-13: qty 250

## 2015-07-13 NOTE — MAU Provider Note (Signed)
Chief Complaint: No chief complaint on file.   First Provider Initiated Contact with Patient 07/13/15 2022      SUBJECTIVE HPI: Erin Bell is a 24 y.o. G1P1001 who presents to maternity admissions reporting abdominal cramping, mostly on lower left side x several weeks and nausea with vomiting today x 1 at work. No LMP recorded. Patient is not currently having periods.  She was using Depo Provera for birth control and last injection was in April 2016.  She does not desire pregnancy and is interested in making appointment at the health department for family planning.  She does report constipation recently with cramping abdominal pain with bowel movements.  She denies vaginal bleeding, vaginal itching/burning, urinary symptoms, h/a, dizziness, n/v, or fever/chills.     Abdominal Cramping This is a recurrent problem. The current episode started 1 to 4 weeks ago. The onset quality is gradual. The problem occurs intermittently. The problem has been waxing and waning. The pain is located in the LLQ, RLQ and generalized abdominal region. The pain is moderate. The quality of the pain is cramping and sharp. The abdominal pain radiates to the back and pelvis. Associated symptoms include constipation, nausea and vomiting. Pertinent negatives include no diarrhea, dysuria, fever, frequency or headaches. The pain is aggravated by bowel movement and coughing. The pain is relieved by nothing. She has tried nothing for the symptoms.    Past Medical History  Diagnosis Date  . No pertinent past medical history   . Chlamydia   . Depression     Previously treated with Risperdal   Past Surgical History  Procedure Laterality Date  . Nose surgery    . Wisdom tooth extraction    . Cesarean section N/A 01/15/2013    Procedure: CESAREAN SECTION;  Surgeon: Kathreen CosierBernard A Marshall, MD;  Location: [facility];  Service: Obstetrics;  Laterality: N/A;   Social History   Social History  . Marital Status: Single    Spouse  Name: N/A  . Number of Children: N/A  . Years of Education: N/A   Occupational History  . Not on file.   Social History Main Topics  . Smoking status: Current Every Day Smoker -- 0.25 packs/day    Types: Cigarettes  . Smokeless tobacco: Never Used  . Alcohol Use: No     Comment: occasional  . Drug Use: No  . Sexual Activity: Yes    Birth Control/ Protection: Other-see comments   Other Topics Concern  . Not on file   Social History Narrative   No current facility-administered medications on file prior to encounter.   No current outpatient prescriptions on file prior to encounter.   No Known Allergies  ROS:  Review of Systems  Constitutional: Negative for fever, chills and fatigue.  HENT: Negative for sinus pressure.   Eyes: Negative for photophobia.  Respiratory: Negative for shortness of breath.   Cardiovascular: Negative for chest pain.  Gastrointestinal: Positive for nausea, vomiting and constipation. Negative for diarrhea.  Genitourinary: Negative for dysuria, frequency, flank pain, vaginal bleeding, vaginal discharge, difficulty urinating, vaginal pain and pelvic pain.  Musculoskeletal: Negative for neck pain.  Neurological: Negative for dizziness, weakness and headaches.  Psychiatric/Behavioral: Negative.      I have reviewed patient's Past Medical Hx, Surgical Hx, Family Hx, Social Hx, medications and allergies.   Physical Exam   No data found.  Constitutional: Well-developed, well-nourished female in no acute distress.  Cardiovascular: normal rate Respiratory: normal effort GI: Abd soft, non-tender. No rebound tenderness or  guarding.  Pos BS x 4 MS: Extremities nontender, no edema, normal ROM Neurologic: Alert and oriented x 4.  GU: Neg CVAT.  PELVIC EXAM: Cervix pink, visually closed, without lesion, moderate amount thin frothy Gangemi discharge, vaginal walls and external genitalia normal Bimanual exam: Cervix 0/long/high, firm, anterior, neg CMT,  uterus nontender, nonenlarged, adnexa without tenderness, enlargement, or mass  LAB RESULTS No results found for this or any previous visit (from the past 24 hour(s)).     Results for orders placed or performed during the hospital encounter of 07/13/15 (from the past 168 hour(s))  GC/Chlamydia probe amp ([place])not at [facility]   Collection Time: 07/13/15 12:00 AM  Result Value Ref Range   Chlamydia Negative    Neisseria gonorrhea Negative   Urinalysis, Routine w reflex microscopic (not at [facility])   Collection Time: 07/13/15  7:20 PM  Result Value Ref Range   Color, Urine YELLOW YELLOW   APPearance CLEAR CLEAR   Specific Gravity, Urine 1.025 1.005 - 1.030   pH 6.0 5.0 - 8.0   Glucose, UA NEGATIVE NEGATIVE mg/dL   Hgb urine dipstick SMALL (A) NEGATIVE   Bilirubin Urine NEGATIVE NEGATIVE   Ketones, ur NEGATIVE NEGATIVE mg/dL   Protein, ur NEGATIVE NEGATIVE mg/dL   Urobilinogen, UA 0.2 0.0 - 1.0 mg/dL   Nitrite NEGATIVE NEGATIVE   Leukocytes, UA NEGATIVE NEGATIVE  Urine microscopic-add on   Collection Time: 07/13/15  7:20 PM  Result Value Ref Range   Squamous Epithelial / LPF FEW (A) RARE   WBC, UA 3-6 <3 WBC/hpf   RBC / HPF 7-10 <3 RBC/hpf   Bacteria, UA FEW (A) RARE  Pregnancy, urine POC   Collection Time: 07/13/15  7:32 PM  Result Value Ref Range   Preg Test, Ur NEGATIVE NEGATIVE  Wet prep, genital   Collection Time: 07/13/15  8:30 PM  Result Value Ref Range   Yeast Wet Prep HPF POC NONE SEEN NONE SEEN   Trich, Wet Prep NONE SEEN NONE SEEN   Clue Cells Wet Prep HPF POC FEW (A) NONE SEEN   WBC, Wet Prep HPF POC FEW (A) NONE SEEN  CBC   Collection Time: 07/13/15  8:45 PM  Result Value Ref Range   WBC 16.4 (H) 4.0 - 10.5 K/uL   RBC 4.79 3.87 - 5.11 MIL/uL   Hemoglobin 14.4 12.0 - 15.0 g/dL   HCT 16.1 09.6 - 04.5 %   MCV 87.7 78.0 - 100.0 fL   MCH 30.1 26.0 - 34.0 pg   MCHC 34.3 30.0 - 36.0 g/dL   RDW 40.9 81.1 - 91.4 %   Platelets 308 150 - 400 K/uL  HIV antibody  (routine testing) (NOT for [facility])   Collection Time: 07/13/15  8:45 PM  Result Value Ref Range   HIV Screen 4th Generation wRfx Non Reactive Non Reactive     IMAGING No results found.  MAU Management/MDM: Ordered labs and reviewed results.  Elevated WBC with pelvic pain and hx of STD suspicious for PID.  Pt also reports recent constipation which could contribute to pt pain. Treatments in MAU included Rocephin 250 mg IM and azithromycin 1000 mg PO x 1 dose. Also, Depo Provera injection given in MAU today. Contraceptive counseling with LARCs presented as most effective forms of birth control.  Pt considering Mirena or Nexplanon at [facility].  Pt stable at time of discharge.  ASSESSMENT 1. PID (acute pelvic inflammatory disease)     PLAN Discharge home Alternative treatment for PID with  azithromycin 1000 mg second dose Rx for pt to take in 1 week (pt unsure of insurance and unable to afford doxycycline) Colace BID PRN and increase fiber in diet and PO fluids for constipation F/U with [facility] for contraceptive management   Follow-up Information    Follow up with [facility] [place].   Why:  for contraceptive management   Contact information:   [address] [place] [address] [phone]      Follow up with [facility] [place] MATERNITY ADMISSIONS.   Why:  As needed for emergencies   Contact information:   [address] [id number] mc [place] [address] [phone]      Sharen Counter Certified Nurse-Midwife 07/17/2015  12:13 AM

## 2015-07-13 NOTE — MAU Note (Signed)
PT SAYS  SHE  GETS DEPO-  LAST SHOT  WAS 12-30-2014  AND  NO BLEEDING  / SPOTTING  SINCE   THEN.   4 HPT -  DONE  -  ALL NEG.     SAYS SHE HAS PAIN  IN LOWER ABD     THINKS   STARTED IN SEPT.    VOMITED  X1  TODAY.     BM'S  NOT   NL  .  LAST SEX-  YESTERDAY.  NO BIRTH  CONTROL

## 2015-07-13 NOTE — Discharge Instructions (Signed)

## 2015-07-14 LAB — GC/CHLAMYDIA PROBE AMP (~~LOC~~) NOT AT ARMC
CHLAMYDIA, DNA PROBE: NEGATIVE
NEISSERIA GONORRHEA: NEGATIVE

## 2015-07-14 LAB — HIV ANTIBODY (ROUTINE TESTING W REFLEX): HIV Screen 4th Generation wRfx: NONREACTIVE

## 2015-11-10 ENCOUNTER — Encounter (HOSPITAL_COMMUNITY): Payer: Self-pay | Admitting: Nurse Practitioner

## 2015-11-10 ENCOUNTER — Emergency Department (HOSPITAL_COMMUNITY)
Admission: EM | Admit: 2015-11-10 | Discharge: 2015-11-10 | Disposition: A | Discharge status: Home / Self Care | Payer: Medicaid Other | Attending: Emergency Medicine | Admitting: Emergency Medicine

## 2015-11-10 DIAGNOSIS — F17210 Nicotine dependence, cigarettes, uncomplicated: Secondary | ICD-10-CM | POA: Insufficient documentation

## 2015-11-10 DIAGNOSIS — J069 Acute upper respiratory infection, unspecified: Secondary | ICD-10-CM | POA: Insufficient documentation

## 2015-11-10 DIAGNOSIS — Z8619 Personal history of other infectious and parasitic diseases: Secondary | ICD-10-CM | POA: Insufficient documentation

## 2015-11-10 DIAGNOSIS — Z3202 Encounter for pregnancy test, result negative: Secondary | ICD-10-CM | POA: Insufficient documentation

## 2015-11-10 DIAGNOSIS — R110 Nausea: Secondary | ICD-10-CM | POA: Insufficient documentation

## 2015-11-10 DIAGNOSIS — Z8659 Personal history of other mental and behavioral disorders: Secondary | ICD-10-CM | POA: Insufficient documentation

## 2015-11-10 LAB — PREGNANCY, URINE: Preg Test, Ur: NEGATIVE

## 2015-11-10 NOTE — ED Provider Notes (Signed)
CSN: [id number]     Arrival date & time 11/10/15  1819 History  By signing my name below, I, Placido Sou, attest that this documentation has been prepared under the direction and in the presence of Melton Krebs PA-C. Electronically Signed: Placido Sou, ED Scribe. 11/10/2015. 7:19 PM.    Chief Complaint  Patient presents with  . URI   The history is provided by the patient. No language interpreter was used.   HPI Comments: Erin Bell is a 25 y.o. female who presents to the Emergency Department complaining of constant, mild, chest and nasal congestion onset 4 days ago. She reports associated body aches, HA, nausea and a mild productive cough. Pt notes that since the onset of her symptoms her coworker was dx with the flu which is the cause of her concern. Her LNMP was 1 year ago noting she typically gets Depo-Provera injections with her last one in October of 2016. She took an at home pregnancy test 2 weeks ago that was negative. Pt denies vomiting, fevers or any other associated symptoms at this time.   Past Medical History  Diagnosis Date  . No pertinent past medical history   . Chlamydia   . Depression     Previously treated with Risperdal   Past Surgical History  Procedure Laterality Date  . Nose surgery    . Wisdom tooth extraction    . Cesarean section N/A 01/15/2013    Procedure: CESAREAN SECTION;  Surgeon: Kathreen Cosier, MD;  Location: [facility];  Service: Obstetrics;  Laterality: N/A;   Family History  Problem Relation Age of Onset  . Other Neg Hx   . Alcohol abuse Neg Hx   . Hypertension Neg Hx   . Hyperlipidemia Neg Hx   . Heart disease Neg Hx   . Hearing loss Neg Hx   . Early death Neg Hx   . Drug abuse Neg Hx   . Miscarriages / Stillbirths Neg Hx   . Mental retardation Neg Hx   . Mental illness Neg Hx   . Learning disabilities Neg Hx   . Kidney disease Neg Hx   . Stroke Neg Hx   . Vision loss Neg Hx   . Arthritis Father   . Birth defects  Sister     spina bifida  . Asthma Brother   . Diabetes Maternal Uncle   . Thyroid disease Mother    Social History  Substance Use Topics  . Smoking status: Current Every Day Smoker -- 0.25 packs/day    Types: Cigarettes  . Smokeless tobacco: Never Used  . Alcohol Use: No     Comment: occasional   OB History    Gravida Para Term Preterm AB TAB SAB Ectopic Multiple Living   0 0 0 0 0 0 1     Review of Systems A complete 10 system review of systems was obtained and all systems are negative except as noted in the HPI and PMH.    Allergies  Review of patient's allergies indicates no known allergies.  Home Medications   Prior to Admission medications   Medication Sig Start Date End Date Taking? Authorizing Provider  azithromycin (ZITHROMAX) 500 MG tablet Take 2 tablets (1,000 mg total) by mouth once. 07/13/15   Lisa A Leftwich-Kirby, CNM  ibuprofen (ADVIL,MOTRIN) 200 MG tablet Take 400 mg by mouth every 6 (six) hours as needed for headache or moderate pain.    Historical Provider, MD   BP 115/72  mmHg  Pulse 85  Temp(Src) 98 F (36.7 C) (Oral)  Resp 18  Ht  (1.549 m)  Wt 145 lb (65.772 kg)  BMI 27.41 kg/m2  SpO2 97%  Physical Exam  Constitutional: She is oriented to person, place, and time. She appears well-developed and well-nourished.  HENT:  Head: Normocephalic and atraumatic.  No erythema of posterior pharynx. Tonsils 2+. Clear effusion of right TM without bulging or erythema.   Eyes: EOM are normal.  Neck: Normal range of motion.  Cardiovascular: Normal rate.   Pulmonary/Chest: Effort normal. No respiratory distress.  Abdominal: Soft.  Musculoskeletal: Normal range of motion.  Neurological: She is alert and oriented to person, place, and time.  Skin: Skin is warm and dry.  Psychiatric: She has a normal mood and affect.  Nursing note and vitals reviewed.   ED Course  Procedures  DIAGNOSTIC STUDIES: Oxygen Saturation is 97% on RA, normal by my  interpretation.    COORDINATION OF CARE: 7:12 PM Discussed next steps with pt. She verbalized understanding and is agreeable with the plan.   Labs Review Labs Reviewed  PREGNANCY, URINE    MDM   Final diagnoses:  Upper respiratory infection   Patient non-toxic appearing and VSS. Negative pregnancy test. Most likely viral URI. Encouraged patient to be compliant with her birth control shots, and to follow-up with gynecology.   Labs Reviewed  PREGNANCY, URINE   Patient feels improved after observation and/or treatment in ED.  Patient may be safely discharged home with symptomatic treatment.  Discussed reasons for return. Patient to follow-up with gynecology and PCP. Patient in understanding and agreement with the plan. I personally performed the services described in this documentation, which was scribed in my presence. The recorded information has been reviewed and is accurate.   Melton Krebs, PA-C 11/11/15 0131  Benjiman Core, MD 11/11/15 [phone]

## 2015-11-10 NOTE — Discharge Instructions (Signed)
[name],  Nice meeting you! Please follow-up with your primary care provider. Return to the emergency department if develops shortness of breath, chest pain. Feel better soon!  S. Lane Hacker, PA-C Upper Respiratory Infection, Adult Most upper respiratory infections (URIs) are a viral infection of the air passages leading to the lungs. A URI affects the nose, throat, and upper air passages. The most common type of URI is nasopharyngitis and is typically referred to as "the common cold." URIs run their course and usually go away on their own. Most of the time, a URI does not require medical attention, but sometimes a bacterial infection in the upper airways can follow a viral infection. This is called a secondary infection. Sinus and middle ear infections are common types of secondary upper respiratory infections. Bacterial pneumonia can also complicate a URI. A URI can worsen asthma and chronic obstructive pulmonary disease (COPD). Sometimes, these complications can require emergency medical care and may be life threatening.  CAUSES Almost all URIs are caused by viruses. A virus is a type of germ and can spread from one person to another.  RISKS FACTORS You may be at risk for a URI if:   You smoke.   You have chronic heart or lung disease.  You have a weakened defense (immune) system.   You are very young or very old.   You have nasal allergies or asthma.  You work in crowded or poorly ventilated areas.  You work in health care facilities or schools. SIGNS AND SYMPTOMS  Symptoms typically develop 2-3 days after you come in contact with a cold virus. Most viral URIs last 7-10 days. However, viral URIs from the influenza virus (flu virus) can last 14-18 days and are typically more severe. Symptoms may include:   Runny or stuffy (congested) nose.   Sneezing.   Cough.   Sore throat.   Headache.   Fatigue.   Fever.   Loss of appetite.   Pain in your  forehead, behind your eyes, and over your cheekbones (sinus pain).  Muscle aches.  DIAGNOSIS  Your health care provider may diagnose a URI by:  Physical exam.  Tests to check that your symptoms are not due to another condition such as:  Strep throat.  Sinusitis.  Pneumonia.  Asthma. TREATMENT  A URI goes away on its own with time. It cannot be cured with medicines, but medicines may be prescribed or recommended to relieve symptoms. Medicines may help:  Reduce your fever.  Reduce your cough.  Relieve nasal congestion. HOME CARE INSTRUCTIONS   Take medicines only as directed by your health care provider.   Gargle warm saltwater or take cough drops to comfort your throat as directed by your health care provider.  Use a warm mist humidifier or inhale steam from a shower to increase air moisture. This may make it easier to breathe.  Drink enough fluid to keep your urine clear or pale yellow.   Eat soups and other clear broths and maintain good nutrition.   Rest as needed.   Return to work when your temperature has returned to normal or as your health care provider advises. You may need to stay home longer to avoid infecting others. You can also use a face mask and careful hand washing to prevent spread of the virus.  Increase the usage of your inhaler if you have asthma.   Do not use any tobacco products, including cigarettes, chewing tobacco, or electronic cigarettes. If you need  help quitting, ask your health care provider. PREVENTION  The best way to protect yourself from getting a cold is to practice good hygiene.   Avoid oral or hand contact with people with cold symptoms.   Wash your hands often if contact occurs.  There is no clear evidence that vitamin C, vitamin E, echinacea, or exercise reduces the chance of developing a cold. However, it is always recommended to get plenty of rest, exercise, and practice good nutrition.  SEEK MEDICAL CARE IF:   You  are getting worse rather than better.   Your symptoms are not controlled by medicine.   You have chills.  You have worsening shortness of breath.  You have brown or red mucus.  You have yellow or brown nasal discharge.  You have pain in your face, especially when you bend forward.  You have a fever.  You have swollen neck glands.  You have pain while swallowing.  You have Brunetti areas in the back of your throat. SEEK IMMEDIATE MEDICAL CARE IF:   You have severe or persistent:  Headache.  Ear pain.  Sinus pain.  Chest pain.  You have chronic lung disease and any of the following:  Wheezing.  Prolonged cough.  Coughing up blood.  A change in your usual mucus.  You have a stiff neck.  You have changes in your:  Vision.  Hearing.  Thinking.  Mood. MAKE SURE YOU:   Understand these instructions.  Will watch your condition.  Will get help right away if you are not doing well or get worse.   This information is not intended to replace advice given to you by your health care provider. Make sure you discuss any questions you have with your health care provider.   Document Released: 03/12/2001 Document Revised: 01/31/2015 Document Reviewed: 12/22/2013 Elsevier Interactive Patient Education [employer]

## 2015-11-10 NOTE — ED Notes (Signed)
Pt c/o 4 day history of cough, body aches, chest and nasal congestion. She tried cough drops and alka seltzer cold with no relief. She is A&Ox4, resp e/u

## 2016-06-08 ENCOUNTER — Encounter: Payer: Self-pay | Admitting: *Deleted

## 2016-06-08 LAB — LAB REPORT - SCANNED: PAP SMEAR: NEGATIVE

## 2016-09-30 NOTE — L&D Delivery Note (Signed)
Patient is a 26 y.o. now G2P1001 who admitted for SOL, now s/p NSVD at [redacted]w[redacted]d.  Delivery Note At 5:31 AM a viable female was delivered via VBAC, Spontaneous (Presentation: ROA).  APGAR: 8, 9; weight Pending skin to skin.   Placenta status: Intact.  Cord: intact, 3 vessel with the following complications: tight double nuchal cord, delivered through and successfully reduced prior to infant to mother's abdomen.  Anesthesia: Epidural Episiotomy: None Lacerations: None Suture Repair: none Est. Blood Loss (mL): 200  Mom to postpartum.  Baby to Couplet care / Skin to Skin.  Head delivered ROA. Tight double nuchal cord present, unable to immediately reduce, delivered through and successfully reduced prior to placing infant to mothers abdomen. Cord remained intact. Shoulder and body delivered in usual fashion. Cord clamped x 2 after 1-minute delay, and cut by husband. Cord blood drawn. Placenta delivered spontaneously with gentle cord traction and maternal pushing. Scant, friable trailing membrane noted. Fundus firm with massage and Pitocin. Labia and Perineum inspected and found to have no lacerations. Good hemostasis achieved.  Dannette Barbara, Medical Student 05/17/2017, 6:25 AM  Patient is a G2P1001 at [redacted]w[redacted]d who was admitted with SOL, significant hx of prev C/S for breech.  She progressed with AROM augmentation to successful VBAC.  I was gloved and present for delivery in its entirety.  Second stage of labor progressed, baby delivered after a few contractions.  No decels during second stage noted.  Complications: none  Lacerations: none  EBL: 200cc  Gay Rape, CNM 9:10 AM 05/17/2017

## 2016-10-09 ENCOUNTER — Encounter (HOSPITAL_COMMUNITY): Payer: Self-pay | Admitting: *Deleted

## 2016-10-09 ENCOUNTER — Inpatient Hospital Stay (HOSPITAL_COMMUNITY): Payer: Medicaid Other

## 2016-10-09 ENCOUNTER — Inpatient Hospital Stay (HOSPITAL_COMMUNITY)
Admission: AD | Admit: 2016-10-09 | Discharge: 2016-10-09 | Disposition: A | Discharge status: Home / Self Care | Payer: Medicaid Other | Source: Ambulatory Visit | Attending: Obstetrics and Gynecology | Admitting: Obstetrics and Gynecology

## 2016-10-09 DIAGNOSIS — O99332 Smoking (tobacco) complicating pregnancy, second trimester: Secondary | ICD-10-CM | POA: Diagnosis not present

## 2016-10-09 DIAGNOSIS — N760 Acute vaginitis: Secondary | ICD-10-CM | POA: Diagnosis not present

## 2016-10-09 DIAGNOSIS — O26891 Other specified pregnancy related conditions, first trimester: Secondary | ICD-10-CM | POA: Diagnosis not present

## 2016-10-09 DIAGNOSIS — O468X1 Other antepartum hemorrhage, first trimester: Secondary | ICD-10-CM

## 2016-10-09 DIAGNOSIS — F17210 Nicotine dependence, cigarettes, uncomplicated: Secondary | ICD-10-CM | POA: Insufficient documentation

## 2016-10-09 DIAGNOSIS — O418X10 Other specified disorders of amniotic fluid and membranes, first trimester, not applicable or unspecified: Secondary | ICD-10-CM

## 2016-10-09 DIAGNOSIS — O208 Other hemorrhage in early pregnancy: Secondary | ICD-10-CM | POA: Diagnosis not present

## 2016-10-09 DIAGNOSIS — Z3A01 Less than 8 weeks gestation of pregnancy: Secondary | ICD-10-CM | POA: Diagnosis not present

## 2016-10-09 DIAGNOSIS — R109 Unspecified abdominal pain: Secondary | ICD-10-CM

## 2016-10-09 DIAGNOSIS — B9689 Other specified bacterial agents as the cause of diseases classified elsewhere: Secondary | ICD-10-CM | POA: Diagnosis not present

## 2016-10-09 DIAGNOSIS — Z3491 Encounter for supervision of normal pregnancy, unspecified, first trimester: Secondary | ICD-10-CM

## 2016-10-09 DIAGNOSIS — O26899 Other specified pregnancy related conditions, unspecified trimester: Secondary | ICD-10-CM

## 2016-10-09 LAB — URINALYSIS, ROUTINE W REFLEX MICROSCOPIC
BILIRUBIN URINE: NEGATIVE
Glucose, UA: NEGATIVE mg/dL
HGB URINE DIPSTICK: NEGATIVE
KETONES UR: NEGATIVE mg/dL
Leukocytes, UA: NEGATIVE
NITRITE: NEGATIVE
PH: 7 (ref 5.0–8.0)
Protein, ur: NEGATIVE mg/dL
Specific Gravity, Urine: 1.015 (ref 1.005–1.030)

## 2016-10-09 LAB — WET PREP, GENITAL
SPERM: NONE SEEN
Trich, Wet Prep: NONE SEEN
Yeast Wet Prep HPF POC: NONE SEEN

## 2016-10-09 LAB — CBC
HCT: 36.1 % (ref 36.0–46.0)
Hemoglobin: 12.6 g/dL (ref 12.0–15.0)
MCH: 30.1 pg (ref 26.0–34.0)
MCHC: 34.9 g/dL (ref 30.0–36.0)
MCV: 86.4 fL (ref 78.0–100.0)
PLATELETS: 313 10*3/uL (ref 150–400)
RBC: 4.18 MIL/uL (ref 3.87–5.11)
RDW: 14.3 % (ref 11.5–15.5)
WBC: 12.4 10*3/uL — ABNORMAL HIGH (ref 4.0–10.5)

## 2016-10-09 LAB — POCT PREGNANCY, URINE: Preg Test, Ur: POSITIVE — AB

## 2016-10-09 LAB — HCG, QUANTITATIVE, PREGNANCY: hCG, Beta Chain, Quant, S: 128056 m[IU]/mL — ABNORMAL HIGH (ref <5)

## 2016-10-09 MED ORDER — METRONIDAZOLE 0.75 % VA GEL: 1.0000 | Freq: Every day | VAGINAL | 1 refills | Status: DC

## 2016-10-09 NOTE — MAU Note (Signed)
Pt C/O RLQ pain for the past week, has become more intense, had to leave work today.  Denies dysuria, fever, vomiting or diarrhea.  States she has "a lot of discharge."

## 2016-10-09 NOTE — Discharge Instructions (Signed)
Subchorionic Hematoma A subchorionic hematoma is a gathering of blood between the outer wall of the placenta and the inner wall of the womb (uterus). The placenta is the organ that connects the fetus to the wall of the uterus. The placenta performs the feeding, breathing (oxygen to the fetus), and waste removal (excretory work) of the fetus.  Subchorionic hematoma is the most common abnormality found on a result from ultrasonography done during the first trimester or early second trimester of pregnancy. If there has been little or no vaginal bleeding, early small hematomas usually shrink on their own and do not affect your baby or pregnancy. The blood is gradually absorbed over 1-2 weeks. When bleeding starts later in pregnancy or the hematoma is larger or occurs in an older pregnant woman, the outcome may not be as good. Larger hematomas may get bigger, which increases the chances for miscarriage. Subchorionic hematoma also increases the risk of premature detachment of the placenta from the uterus, preterm (premature) labor, and stillbirth. HOME CARE INSTRUCTIONS  Stay on bed rest if your health care provider recommends this. Although bed rest will not prevent more bleeding or prevent a miscarriage, your health care provider may recommend bed rest until you are advised otherwise.  Avoid heavy lifting (more than 10 lb [4.5 kg]), exercise, sexual intercourse, or douching as directed by your health care provider.  Keep track of the number of pads you use each day and how soaked (saturated) they are. Write down this information.  Do not use tampons.  Keep all follow-up appointments as directed by your health care provider. Your health care provider may ask you to have follow-up blood tests or ultrasound tests or both. SEEK IMMEDIATE MEDICAL CARE IF:  You have severe cramps in your stomach, back, abdomen, or pelvis.  You have a fever.  You pass large clots or tissue. Save any tissue for your health  care provider to look at.  Your bleeding increases or you become lightheaded, feel weak, or have fainting episodes. This information is not intended to replace advice given to you by your health care provider. Make sure you discuss any questions you have with your health care provider. Document Released: 01/01/2007 Document Revised: 10/07/2014 Document Reviewed: 04/15/2013 Elsevier Interactive Patient Education  2017 [employer]. Bacterial Vaginosis Bacterial vaginosis is a vaginal infection that occurs when the normal balance of bacteria in the vagina is disrupted. It results from an overgrowth of certain bacteria. This is the most common vaginal infection among women ages 54-44. Because bacterial vaginosis increases your risk for STIs (sexually transmitted infections), getting treated can help reduce your risk for chlamydia, gonorrhea, herpes, and HIV (human immunodeficiency virus). Treatment is also important for preventing complications in pregnant women, because this condition can cause an early (premature) delivery. What are the causes? This condition is caused by an increase in harmful bacteria that are normally present in small amounts in the vagina. However, the reason that the condition develops is not fully understood. What increases the risk? The following factors may make you more likely to develop this condition:  Having a new sexual partner or multiple sexual partners.  Having unprotected sex.  Douching.  Having an intrauterine device (IUD).  Smoking.  Drug and alcohol abuse.  Taking certain antibiotic medicines.  Being pregnant. You cannot get bacterial vaginosis from toilet seats, bedding, swimming pools, or contact with objects around you. What are the signs or symptoms? Symptoms of this condition include:  Grey or [place]ak vaginal discharge. The  discharge can also be watery or foamy.  A fish-like odor with discharge, especially after sexual intercourse or during  menstruation.  Itching in and around the vagina.  Burning or pain with urination. Some women with bacterial vaginosis have no signs or symptoms. How is this diagnosed? This condition is diagnosed based on:  Your medical history.  A physical exam of the vagina.  Testing a sample of vaginal fluid under a microscope to look for a large amount of bad bacteria or abnormal cells. Your health care provider may use a cotton swab or a small wooden spatula to collect the sample. How is this treated? This condition is treated with antibiotics. These may be given as a pill, a vaginal cream, or a medicine that is put into the vagina (suppository). If the condition comes back after treatment, a second round of antibiotics may be needed. Follow these instructions at home: Medicines  Take over-the-counter and prescription medicines only as told by your health care provider.  Take or use your antibiotic as told by your health care provider. Do not stop taking or using the antibiotic even if you start to feel better. General instructions  If you have a female sexual partner, tell her that you have a vaginal infection. She should see her health care provider and be treated if she has symptoms. If you have a female sexual partner, he does not need treatment.  During treatment:  Avoid sexual activity until you finish treatment.  Do not douche.  Avoid alcohol as directed by your health care provider.  Avoid breastfeeding as directed by your health care provider.  Drink enough water and fluids to keep your urine clear or pale yellow.  Keep the area around your vagina and rectum clean.  Wash the area daily with warm water.  Wipe yourself from front to back after using the toilet.  Keep all follow-up visits as told by your health care provider. This is important. How is this prevented?  Do not douche.  Wash the outside of your vagina with warm water only.  Use protection when having sex. This  includes latex condoms and dental dams.  Limit how many sexual partners you have. To help prevent bacterial vaginosis, it is best to have sex with just one partner (monogamous).  Make sure you and your sexual partner are tested for STIs.  Wear cotton or cotton-lined underwear.  Avoid wearing tight pants and pantyhose, especially during summer.  Limit the amount of alcohol that you drink.  Do not use any products that contain nicotine or tobacco, such as cigarettes and e-cigarettes. If you need help quitting, ask your health care provider.  Do not use illegal drugs. Where to find more information:  Centers for Disease Control and Prevention: SolutionApps.co.zawww.cdc.gov/std  American Sexual Health Association (ASHA): www.ashastd.org  U.S. Department of Health and Health and safety inspectorHuman Services, Office on Women's Health: ConventionalMedicines.siwww.womenshealth.gov/ or http://www.anderson-williamson.info/https://www.womenshealth.gov/a-z-topics/bacterial-vaginosis Contact a health care provider if:  Your symptoms do not improve, even after treatment.  You have more discharge or pain when urinating.  You have a fever.  You have pain in your abdomen.  You have pain during sex.  You have vaginal bleeding between periods. Summary  Bacterial vaginosis is a vaginal infection that occurs when the normal balance of bacteria in the vagina is disrupted.  Because bacterial vaginosis increases your risk for STIs (sexually transmitted infections), getting treated can help reduce your risk for chlamydia, gonorrhea, herpes, and HIV (human immunodeficiency virus). Treatment is also important for preventing  complications in pregnant women, because the condition can cause an early (premature) delivery.  This condition is treated with antibiotic medicines. These may be given as a pill, a vaginal cream, or a medicine that is put into the vagina (suppository). This information is not intended to replace advice given to you by your health care provider. Make sure you discuss any questions  you have with your health care provider. Document Released: 09/16/2005 Document Revised: 06/01/2016 Document Reviewed: 06/01/2016 Elsevier Interactive Patient Education  2017 [employer].

## 2016-10-09 NOTE — MAU Provider Note (Signed)
History     CSN: [id number]  Arrival date and time: 10/09/16 1033   First Provider Initiated Contact with Patient 10/09/16 1130      Chief Complaint  Patient presents with  . Abdominal Pain  . Vaginal Discharge   HPI Erin Bell is a 26 y.o. G2P1001 at [redacted]w[redacted]d by LMP who presents with abdominal pain. Reports intermittent RLQ pain for the last week. Pain occurs sporadically throughout the day & lasts for 5-10 minutes at a time. Last occurred this morning at work. Rates pain 7/10 when it occurs. No pain since arrival to [facility]. Has not treated pain. Nothing makes better or worse; pain resolves on it's own. Some vaginal discharge that is thin & Lotts/yellow. No odor or irritation.  Denies n/v/d, constipation, vaginal bleeding, or dysuria. Last BM this morning.   OB History    Gravida Para Term Preterm AB Living   2 1 1  0 0 1   SAB TAB Ectopic Multiple Live Births   0 0 0 0 1      Past Medical History:  Diagnosis Date  . Chlamydia   . Depression    Previously treated with Risperdal    Past Surgical History:  Procedure Laterality Date  . CESAREAN SECTION N/A 01/15/2013   Procedure: CESAREAN SECTION;  Surgeon: Kathreen Cosier, MD;  Location: [facility];  Service: Obstetrics;  Laterality: N/A;  . NOSE SURGERY    . WISDOM TOOTH EXTRACTION      Family History  Problem Relation Age of Onset  . Arthritis Father   . Birth defects Sister     spina bifida  . Asthma Brother   . Diabetes Maternal Uncle   . Thyroid disease Mother   . Other Neg Hx   . Alcohol abuse Neg Hx   . Hypertension Neg Hx   . Hyperlipidemia Neg Hx   . Heart disease Neg Hx   . Hearing loss Neg Hx   . Early death Neg Hx   . Drug abuse Neg Hx   . Miscarriages / Stillbirths Neg Hx   . Mental retardation Neg Hx   . Mental illness Neg Hx   . Learning disabilities Neg Hx   . Kidney disease Neg Hx   . Stroke Neg Hx   . Vision loss Neg Hx     Social History  Substance Use Topics  . Smoking status: Current  Every Day Smoker    Packs/day: 0.25    Types: Cigarettes  . Smokeless tobacco: Never Used  . Alcohol use No     Comment: occasional    Allergies: No Known Allergies  Prescriptions Prior to Admission  Medication Sig Dispense Refill Last Dose  . ibuprofen (ADVIL,MOTRIN) 200 MG tablet Take 400 mg by mouth every 6 (six) hours as needed for headache or moderate pain.   Past Month at Unknown time    Review of Systems  Constitutional: Negative for chills and fever.  Gastrointestinal: Positive for abdominal pain. Negative for constipation, diarrhea, nausea and vomiting.  Genitourinary: Positive for vaginal discharge. Negative for dysuria, vaginal bleeding and vaginal pain.   Physical Exam   Blood pressure 107/66, pulse 85, temperature 98 F (36.7 C), temperature source Oral, resp. rate 16, height 5\' 2"  (1.575 m), weight 144 lb (65.3 kg), last menstrual period 08/24/2016.  Physical Exam  Nursing note and vitals reviewed. Constitutional: She is oriented to person, place, and time. She appears well-developed and well-nourished. No distress.  HENT:  Head: Normocephalic and atraumatic.  Eyes: Conjunctivae are normal. Right eye exhibits no discharge. Left eye exhibits no discharge. No scleral icterus.  Neck: Normal range of motion.  Cardiovascular: Normal rate, regular rhythm and normal heart sounds.   No murmur heard. Respiratory: Effort normal and breath sounds normal. No respiratory distress. She has no wheezes.  GI: Soft. Bowel sounds are normal. She exhibits no distension. There is no tenderness. There is no rebound and no guarding.  Genitourinary: Uterus normal. Cervix exhibits no motion tenderness and no friability. Right adnexum displays no mass and no tenderness. Left adnexum displays no mass and no tenderness. No bleeding in the vagina. Vaginal discharge (moderate amount of thick yellow discharge) found.  Genitourinary Comments: Cervix closed  Neurological: She is alert and oriented  to person, place, and time.  Skin: Skin is warm and dry. She is not diaphoretic.  Psychiatric: She has a normal mood and affect. Her behavior is normal. Judgment and thought content normal.    MAU Course  Procedures Results for orders placed or performed during the hospital encounter of 10/09/16 (from the past 24 hour(s))  Urinalysis, Routine w reflex microscopic     Status: None   Collection Time: 10/09/16 10:50 AM  Result Value Ref Range   Color, Urine YELLOW YELLOW   APPearance CLEAR CLEAR   Specific Gravity, Urine 1.015 1.005 - 1.030   pH 7.0 5.0 - 8.0   Glucose, UA NEGATIVE NEGATIVE mg/dL   Hgb urine dipstick NEGATIVE NEGATIVE   Bilirubin Urine NEGATIVE NEGATIVE   Ketones, ur NEGATIVE NEGATIVE mg/dL   Protein, ur NEGATIVE NEGATIVE mg/dL   Nitrite NEGATIVE NEGATIVE   Leukocytes, UA NEGATIVE NEGATIVE  Pregnancy, urine POC     Status: Abnormal   Collection Time: 10/09/16 11:08 AM  Result Value Ref Range   Preg Test, Ur POSITIVE (A) NEGATIVE  CBC     Status: Abnormal   Collection Time: 10/09/16 11:34 AM  Result Value Ref Range   WBC 12.4 (H) 4.0 - 10.5 K/uL   RBC 4.18 3.87 - 5.11 MIL/uL   Hemoglobin 12.6 12.0 - 15.0 g/dL   HCT 16.1 09.6 - 04.5 %   MCV 86.4 78.0 - 100.0 fL   MCH 30.1 26.0 - 34.0 pg   MCHC 34.9 30.0 - 36.0 g/dL   RDW 40.9 81.1 - 91.4 %   Platelets 313 150 - 400 K/uL  hCG, quantitative, pregnancy     Status: Abnormal   Collection Time: 10/09/16 11:34 AM  Result Value Ref Range   hCG, Beta Chain, Quant, S 128,056 (H) <5 mIU/mL  Wet prep, genital     Status: Abnormal   Collection Time: 10/09/16 11:46 AM  Result Value Ref Range   Yeast Wet Prep HPF POC NONE SEEN NONE SEEN   Trich, Wet Prep NONE SEEN NONE SEEN   Clue Cells Wet Prep HPF POC PRESENT (A) NONE SEEN   WBC, Wet Prep HPF POC TOO NUMEROUS TO COUNT (A) NONE SEEN   Sperm NONE SEEN    US Ob Comp Less 14 Wks  Result Date: 10/09/2016 CLINICAL DATA:  Right lower quadrant abdominal and pelvic pain.  EXAM: OBSTETRIC <14 WK [place] AND TRANSVAGINAL OB US TECHNIQUE: Both transabdominal and transvaginal ultrasound examinations were performed for complete evaluation of the gestation as well as the maternal uterus, adnexal regions, and pelvic cul-de-sac. Transvaginal technique was performed to assess early pregnancy. COMPARISON:  None. FINDINGS: Intrauterine gestational sac: Single Yolk sac:  Visualized. Embryo:  Visualized. Cardiac Activity: Visualized. Heart Rate: 135  bpm CRL:  12  mm   7 w   2 d                  US EDC: 05/26/2017 Subchorionic hemorrhage: Small subchorionic hemorrhage seen along inferior aspect of gestational sac. Maternal uterus/adnexae: Retroverted uterus. No fibroids identified. Both ovaries are unremarkable in appearance. No mass or free fluid identified. IMPRESSION: Single living IUP measuring 7 weeks 2 days, with US EDC of 05/26/2017. Small subchorionic hemorrhage noted. Electronically Signed   By: Myles RosenthalJohn  Stahl M.D.   On: 10/09/2016 13:09   [place] Ob Transvaginal  Result Date: 10/09/2016 CLINICAL DATA:  Right lower quadrant abdominal and pelvic pain. EXAM: OBSTETRIC <14 WK US AND TRANSVAGINAL OB US TECHNIQUE: Both transabdominal and transvaginal ultrasound examinations were performed for complete evaluation of the gestation as well as the maternal uterus, adnexal regions, and pelvic cul-de-sac. Transvaginal technique was performed to assess early pregnancy. COMPARISON:  None. FINDINGS: Intrauterine gestational sac: Single Yolk sac:  Visualized. Embryo:  Visualized. Cardiac Activity: Visualized. Heart Rate: 135  bpm CRL:  12  mm   7 w   2 d                  US EDC: 05/26/2017 Subchorionic hemorrhage: Small subchorionic hemorrhage seen along inferior aspect of gestational sac. Maternal uterus/adnexae: Retroverted uterus. No fibroids identified. Both ovaries are unremarkable in appearance. No mass or free fluid identified. IMPRESSION: Single living IUP measuring 7 weeks 2 days, with US EDC of  05/26/2017. Small subchorionic hemorrhage noted. Electronically Signed   By: Myles RosenthalJohn  Stahl M.D.   On: 10/09/2016 13:09     MDM +UPT UA, wet prep, GC/chlamydia, CBC, ABO/Rh, quant hCG, HIV, and US today to rule out ectopic pregnancy B positive Ultrasound shows SIUP with cardiac activity & small SCH  Assessment and Plan  A: 1. Normal IUP (intrauterine pregnancy) on prenatal ultrasound, first trimester   2. Abdominal pain affecting pregnancy   3. Subchorionic hematoma in first trimester, single or unspecified fetus   4. BV (bacterial vaginosis)    P: Discharge home Rx metrogel Discussed reasons to return to [facility]U GC/CT pending Start prenatal care  Judeth Hornrin Xzayvion Vaeth 10/09/2016, 11:30 AM

## 2016-10-10 LAB — HIV ANTIBODY (ROUTINE TESTING W REFLEX): HIV Screen 4th Generation wRfx: NONREACTIVE

## 2016-10-10 LAB — GC/CHLAMYDIA PROBE AMP (~~LOC~~) NOT AT ARMC
CHLAMYDIA, DNA PROBE: NEGATIVE
Neisseria Gonorrhea: NEGATIVE

## 2016-10-25 ENCOUNTER — Other Ambulatory Visit (HOSPITAL_COMMUNITY): Payer: Self-pay | Admitting: Nurse Practitioner

## 2016-10-25 DIAGNOSIS — Z3682 Encounter for antenatal screening for nuchal translucency: Secondary | ICD-10-CM

## 2016-10-25 DIAGNOSIS — Z3A13 13 weeks gestation of pregnancy: Secondary | ICD-10-CM

## 2016-11-14 ENCOUNTER — Encounter (HOSPITAL_COMMUNITY): Payer: Self-pay | Admitting: Nurse Practitioner

## 2016-11-20 ENCOUNTER — Other Ambulatory Visit (HOSPITAL_COMMUNITY): Payer: Self-pay | Admitting: Nurse Practitioner

## 2016-11-20 ENCOUNTER — Ambulatory Visit (HOSPITAL_COMMUNITY)
Admission: RE | Admit: 2016-11-20 | Discharge: 2016-11-20 | Disposition: A | Discharge status: Home / Self Care | Payer: Medicaid Other | Source: Ambulatory Visit | Attending: Nurse Practitioner | Admitting: Nurse Practitioner

## 2016-11-20 ENCOUNTER — Encounter (HOSPITAL_COMMUNITY): Payer: Self-pay

## 2016-11-20 DIAGNOSIS — E0590 Thyrotoxicosis, unspecified without thyrotoxic crisis or storm: Secondary | ICD-10-CM

## 2016-11-20 DIAGNOSIS — Z3A12 12 weeks gestation of pregnancy: Secondary | ICD-10-CM

## 2016-11-20 DIAGNOSIS — O99281 Endocrine, nutritional and metabolic diseases complicating pregnancy, first trimester: Secondary | ICD-10-CM

## 2016-11-20 DIAGNOSIS — O34219 Maternal care for unspecified type scar from previous cesarean delivery: Secondary | ICD-10-CM

## 2016-11-20 DIAGNOSIS — O99280 Endocrine, nutritional and metabolic diseases complicating pregnancy, unspecified trimester: Secondary | ICD-10-CM

## 2016-11-20 DIAGNOSIS — Z3682 Encounter for antenatal screening for nuchal translucency: Secondary | ICD-10-CM | POA: Diagnosis present

## 2016-11-20 DIAGNOSIS — O34211 Maternal care for low transverse scar from previous cesarean delivery: Secondary | ICD-10-CM | POA: Insufficient documentation

## 2016-11-20 DIAGNOSIS — E079 Disorder of thyroid, unspecified: Secondary | ICD-10-CM

## 2016-11-20 DIAGNOSIS — Z3A13 13 weeks gestation of pregnancy: Secondary | ICD-10-CM

## 2016-11-22 ENCOUNTER — Other Ambulatory Visit: Payer: Self-pay

## 2016-12-26 ENCOUNTER — Other Ambulatory Visit: Payer: Self-pay | Admitting: Family Medicine

## 2016-12-26 DIAGNOSIS — Z3689 Encounter for other specified antenatal screening: Secondary | ICD-10-CM

## 2017-01-07 ENCOUNTER — Other Ambulatory Visit: Payer: Self-pay | Admitting: Family Medicine

## 2017-01-07 ENCOUNTER — Ambulatory Visit (HOSPITAL_COMMUNITY)
Admission: RE | Admit: 2017-01-07 | Discharge: 2017-01-07 | Disposition: A | Discharge status: Home / Self Care | Payer: Medicaid Other | Source: Ambulatory Visit | Attending: Family Medicine | Admitting: Family Medicine

## 2017-01-07 ENCOUNTER — Encounter (HOSPITAL_COMMUNITY): Payer: Self-pay

## 2017-01-07 DIAGNOSIS — O352XX1 Maternal care for (suspected) hereditary disease in fetus, fetus 1: Secondary | ICD-10-CM

## 2017-01-07 DIAGNOSIS — Z3689 Encounter for other specified antenatal screening: Secondary | ICD-10-CM

## 2017-01-07 DIAGNOSIS — Z8279 Family history of other congenital malformations, deformations and chromosomal abnormalities: Secondary | ICD-10-CM

## 2017-01-07 DIAGNOSIS — E079 Disorder of thyroid, unspecified: Secondary | ICD-10-CM | POA: Insufficient documentation

## 2017-01-07 DIAGNOSIS — O99282 Endocrine, nutritional and metabolic diseases complicating pregnancy, second trimester: Secondary | ICD-10-CM | POA: Diagnosis not present

## 2017-01-07 DIAGNOSIS — O34219 Maternal care for unspecified type scar from previous cesarean delivery: Secondary | ICD-10-CM

## 2017-01-07 DIAGNOSIS — Z3A19 19 weeks gestation of pregnancy: Secondary | ICD-10-CM | POA: Insufficient documentation

## 2017-01-07 NOTE — Progress Notes (Addendum)
Genetic Counseling  High-Risk Gestation Note  Appointment Date:  01/07/2017 Referred By: [facility] Department Date of Birth:  26-Feb-1991  Pregnancy History: G2P1001 Estimated Date of Delivery: 05/31/17 Estimated Gestational Age: 41w3dAttending: [place] MD  I met with Ms. AKathlee Nationsand her partner for genetic counseling because of a family history of spina bifida.  In summary:  Discussed family history of spina bifida  Reviewed common etiologies and usual multifactorial etiology  Reviewed normal MSAFP results and the associated reduction in risks for an NTD in the current pregnancy  Discussed additional options for screening / testing  Ultrasound-performed today; no anomalies or markers seen  Amniocentesis-declined  Discussed general population carrier screening options  CF, SMA, and hemoglobinopathies-declined  We began by reviewing the family history in detail. Ms. WMadillreported that her maternal half sister has spina bifida. She reportedly is 24years old and lives in a group home. She is nonambulatory. She has normal intellect. There are no described features that are concerning for an underlying syndrome.   We discussed that spina bifida is a common birth difference affects approximately 1 in 1000 live births.  We reviewed that the spine contains nerves that control the legs, bladder and bowel.  If there is an opening or a change in the development of the spine, the nerves can be damaged or incompletely formed resulting in a wide range of health concerns and an increased risk for stillbirth.  They were counseled that NTDs occur as an isolated finding, in the majority of cases.  Isolated indicates that the NTD is the only birth difference that occurred in the baby.  Isolated NTDs are usually inherited in a multifactorial manner in which there is no prior family history.  Multifactorial conditions have both environmental and genetic factors that  contribute to their development.  Both the genetic and environmental factors which contribute to the development of spina bifida are largely unknown; however, some medications and health conditions, such as uncontrolled diabetes and obesity, may increase the chance of spina bifida.  We also discussed the role of folic acid in the development of the neural tube and prevention of spina bifida.    We also discussed that NTDs may occur as a feature of an underlying genetic syndrome or condition.  Approximately 5-10% of individuals who have spina bifida also have an underlying chromosome condition.  We reviewed chromosomes, genes, and various inheritance patterns.  Without additional information, it is difficult to determine a specific etiology.  If Ms. Leiber's sister had a nonsyndromic, multifactorial NTD, the risk of recurrence is estimated to be 1-2%. If however, this relative had an underlying genetic syndrome, the risk of recurrence depends upon the inheritance of the condition. Further genetic counseling is warranted if more information is obtained.  We discussed prenatal screening and diagnostic options for detection of NTDs.  We discussed that screening options adjust a patient's a priori risk and provide a pregnancy specific risk for the condition.  Specifically, we reviewed MSAFP and ultrasound.  We reviewed that detailed ultrasound detects approximately 90% of NTDs in well visualized fetuses. We reviewed that MSAFP screening identifies approximately 80% of fetuses with ONTDs.  Ms. WWessellspreviously had MSAFP screening, which returned normal results. Given the MSAFP MoM of 1.20, the adjusted risk for an ONTD in the fetus is 1 in 330.  In addition, Ms. Obenchain had a detailed anatomy ultrasound today. No anomalies or markers for aneuploidy or an ONTD were detected.   In  addition, we discussed the option of amniocentesis including the associated risks, benefits, and limitations.  She understands that amniotic  fluid levels of AFP and ACHE can detect >99% of ONTDs. Amniocentesis was declined.  The family histories were otherwise found to be noncontributory for birth defects, mental retardation, and known genetic conditions. Without further information regarding the provided family history, an accurate genetic risk cannot be calculated. Further genetic counseling is warranted if more information is obtained.  Ms. Wakeman was provided with written information regarding cystic fibrosis (CF), spinal muscular atrophy (SMA) and hemoglobinopathies including the carrier frequency, availability of carrier screening and prenatal diagnosis if indicated.  In addition, we discussed that CF and hemoglobinopathies are routinely screened for as part of the [place] newborn screening panel.  After further discussion, she declined screening for CF, SMA and hemoglobinopathies.  Ms. Graw denied exposure to environmental toxins or chemical agents. She denied the use of alcohol, tobacco or street drugs. She denied significant viral illnesses during the course of her pregnancy. Her medical and surgical histories were noncontributory.   I counseled Ms. Mckiddy regarding the above risks and available options. The approximate face-to-face time with the genetic counselor was 31 minutes.  Filbert Schilder, MS Certified Genetic Counselor

## 2017-02-03 ENCOUNTER — Other Ambulatory Visit: Payer: Self-pay

## 2017-03-03 ENCOUNTER — Inpatient Hospital Stay (HOSPITAL_COMMUNITY)
Admission: AD | Admit: 2017-03-03 | Discharge: 2017-03-03 | Disposition: A | Discharge status: Home / Self Care | Payer: Medicaid Other | Source: Ambulatory Visit | Attending: Obstetrics and Gynecology | Admitting: Obstetrics and Gynecology

## 2017-03-03 ENCOUNTER — Encounter (HOSPITAL_COMMUNITY): Payer: Self-pay | Admitting: *Deleted

## 2017-03-03 DIAGNOSIS — O4702 False labor before 37 completed weeks of gestation, second trimester: Secondary | ICD-10-CM | POA: Diagnosis not present

## 2017-03-03 DIAGNOSIS — Z8619 Personal history of other infectious and parasitic diseases: Secondary | ICD-10-CM | POA: Insufficient documentation

## 2017-03-03 DIAGNOSIS — O23593 Infection of other part of genital tract in pregnancy, third trimester: Secondary | ICD-10-CM | POA: Insufficient documentation

## 2017-03-03 DIAGNOSIS — O99342 Other mental disorders complicating pregnancy, second trimester: Secondary | ICD-10-CM | POA: Diagnosis not present

## 2017-03-03 DIAGNOSIS — B9689 Other specified bacterial agents as the cause of diseases classified elsewhere: Secondary | ICD-10-CM | POA: Diagnosis not present

## 2017-03-03 DIAGNOSIS — Z8349 Family history of other endocrine, nutritional and metabolic diseases: Secondary | ICD-10-CM | POA: Insufficient documentation

## 2017-03-03 DIAGNOSIS — Z833 Family history of diabetes mellitus: Secondary | ICD-10-CM | POA: Diagnosis not present

## 2017-03-03 DIAGNOSIS — Z9889 Other specified postprocedural states: Secondary | ICD-10-CM | POA: Insufficient documentation

## 2017-03-03 DIAGNOSIS — R109 Unspecified abdominal pain: Secondary | ICD-10-CM | POA: Diagnosis present

## 2017-03-03 DIAGNOSIS — Z87891 Personal history of nicotine dependence: Secondary | ICD-10-CM | POA: Diagnosis not present

## 2017-03-03 DIAGNOSIS — Z3A27 27 weeks gestation of pregnancy: Secondary | ICD-10-CM | POA: Insufficient documentation

## 2017-03-03 DIAGNOSIS — Z8261 Family history of arthritis: Secondary | ICD-10-CM | POA: Insufficient documentation

## 2017-03-03 DIAGNOSIS — N760 Acute vaginitis: Secondary | ICD-10-CM | POA: Diagnosis not present

## 2017-03-03 DIAGNOSIS — Z8279 Family history of other congenital malformations, deformations and chromosomal abnormalities: Secondary | ICD-10-CM | POA: Diagnosis not present

## 2017-03-03 DIAGNOSIS — F329 Major depressive disorder, single episode, unspecified: Secondary | ICD-10-CM | POA: Insufficient documentation

## 2017-03-03 DIAGNOSIS — Z825 Family history of asthma and other chronic lower respiratory diseases: Secondary | ICD-10-CM | POA: Diagnosis not present

## 2017-03-03 LAB — CBC WITH DIFFERENTIAL/PLATELET
Basophils Absolute: 0 10*3/uL (ref 0.0–0.1)
Basophils Relative: 0 %
EOS ABS: 0.1 10*3/uL (ref 0.0–0.7)
Eosinophils Relative: 1 %
HEMATOCRIT: 37.6 % (ref 36.0–46.0)
Hemoglobin: 12.7 g/dL (ref 12.0–15.0)
Lymphocytes Relative: 18 %
Lymphs Abs: 2 10*3/uL (ref 0.7–4.0)
MCH: 29 pg (ref 26.0–34.0)
MCHC: 33.8 g/dL (ref 30.0–36.0)
MCV: 85.8 fL (ref 78.0–100.0)
MONO ABS: 0.4 10*3/uL (ref 0.1–1.0)
MONOS PCT: 4 %
Neutro Abs: 8.8 10*3/uL — ABNORMAL HIGH (ref 1.7–7.7)
Neutrophils Relative %: 77 %
Platelets: 237 10*3/uL (ref 150–400)
RBC: 4.38 MIL/uL (ref 3.87–5.11)
RDW: 15 % (ref 11.5–15.5)
WBC: 11.3 10*3/uL — ABNORMAL HIGH (ref 4.0–10.5)

## 2017-03-03 LAB — URINALYSIS, ROUTINE W REFLEX MICROSCOPIC
BILIRUBIN URINE: NEGATIVE
GLUCOSE, UA: NEGATIVE mg/dL
KETONES UR: NEGATIVE mg/dL
Nitrite: NEGATIVE
PH: 6 (ref 5.0–8.0)
Protein, ur: NEGATIVE mg/dL
Specific Gravity, Urine: 1.014 (ref 1.005–1.030)

## 2017-03-03 LAB — WET PREP, GENITAL
Sperm: NONE SEEN
TRICH WET PREP: NONE SEEN
YEAST WET PREP: NONE SEEN

## 2017-03-03 LAB — FETAL FIBRONECTIN: FETAL FIBRONECTIN: NEGATIVE

## 2017-03-03 MED ORDER — METRONIDAZOLE 500 MG PO TABS: 500.0000 mg | ORAL_TABLET | Freq: Two times a day (BID) | ORAL | 0 refills | Status: DC

## 2017-03-03 MED ORDER — BETAMETHASONE SOD PHOS & ACET 6 (3-3) MG/ML IJ SUSP
12.0000 mg | Freq: Once | INTRAMUSCULAR | Status: AC
  Administered 2017-03-03: 12 mg via INTRAMUSCULAR
  Filled 2017-03-03: qty 2

## 2017-03-03 MED ORDER — IBUPROFEN 600 MG PO TABS
600.0000 mg | ORAL_TABLET | Freq: Once | ORAL | Status: AC
  Administered 2017-03-03: 600 mg via ORAL
  Filled 2017-03-03: qty 1

## 2017-03-03 NOTE — MAU Note (Signed)
Pt reports that she woke up at 2 am with right sided abdominal pain. Pt describes the pain as sharp. Pt states the pain feels better while at rest, but gets worse when she gets up to move or walk around.   Denies leaking and bleeding. Pt denies feeling the baby move this morning, but felt movement when put on EFM.

## 2017-03-03 NOTE — Discharge Instructions (Signed)

## 2017-03-03 NOTE — MAU Provider Note (Signed)
Chief Complaint:  Abdominal Pain   None    HPI: Erin Bell is a 26 y.o. G2P1001 at [redacted]w[redacted]d who presents to maternity admissions reporting Right sided pain since 2 am. Thinks she may be having braxton Willa Rough because she feels her abd tighten up.  Location: right lower side Quality: cramping, sharp Severity: mild pain now. 8/10 in pain scale at worst. Duration: <8 hours Context: [redacted] weeks pregnant, Hx C/S. Timing: intermittent Modifying factors: Nothing. Hasn't tried anything for the pain.  Associated signs and symptoms: neg for fever, chills, change in appetite, GI complaints, urinary complaints, VB, LOF, vaginal discharge. Good fetal movement.    Past Medical History:  Diagnosis Date  . Chlamydia   . Depression    Previously treated with Risperdal   OB History  Gravida Para Term Preterm AB Living  2 1 1  0 0 1  SAB TAB Ectopic Multiple Live Births  0 0 0 0 1    # Outcome Date GA Lbr Len/2nd Weight Sex Delivery Anes PTL Lv  2 Current           1 Term 01/15/13 [redacted]w[redacted]d 22:30 / 00:51 6 lb 0.8 oz (2.745 kg) F CS-LVertical Spinal  LIV    Obstetric Comments  Delivery note attached to prenatal record indicates 2014 low transverse c/s    Past Surgical History:  Procedure Laterality Date  . CESAREAN SECTION N/A 01/15/2013   Procedure: CESAREAN SECTION;  Surgeon: Kathreen Cosier, MD;  Location: [facility];  Service: Obstetrics;  Laterality: N/A;  . NOSE SURGERY    . WISDOM TOOTH EXTRACTION     Family History  Problem Relation Age of Onset  . Arthritis Father   . Birth defects Sister        spina bifida  . Asthma Brother   . Diabetes Maternal Uncle   . Thyroid disease Mother   . Other Neg Hx   . Alcohol abuse Neg Hx   . Hypertension Neg Hx   . Hyperlipidemia Neg Hx   . Heart disease Neg Hx   . Hearing loss Neg Hx   . Early death Neg Hx   . Drug abuse Neg Hx   . Miscarriages / Stillbirths Neg Hx   . Mental retardation Neg Hx   . Mental illness Neg Hx   . Learning  disabilities Neg Hx   . Kidney disease Neg Hx   . Stroke Neg Hx   . Vision loss Neg Hx    Social History  Substance Use Topics  . Smoking status: Former Smoker    Packs/day: 0.25    Types: Cigarettes  . Smokeless tobacco: Never Used  . Alcohol use No     Comment: occasional   No Known Allergies Prescriptions Prior to Admission  Medication Sig Dispense Refill Last Dose  . levothyroxine (SYNTHROID, LEVOTHROID) 50 MCG tablet   0 03/02/2017 at Unknown time  . Prenatal Vit-Fe Fumarate-FA (PRENATAL VITAMIN PO) Take by mouth.   03/02/2017 at Unknown time    I have reviewed patient's Past Medical Hx, Surgical Hx, Family Hx, Social Hx, medications and allergies.   ROS:  Review of Systems  Constitutional: Negative for appetite change, chills and fever.  Gastrointestinal: Positive for abdominal pain. Negative for abdominal distention, blood in stool, constipation, diarrhea, nausea and vomiting.  Genitourinary: Positive for flank pain (questionable). Negative for difficulty urinating, dysuria, frequency, urgency, vaginal bleeding and vaginal discharge.  Musculoskeletal: Negative for back pain.    Physical Exam   Patient  Vitals for the past 24 hrs:  BP Temp Temp src Pulse Resp Height Weight  03/03/17 0931 113/71 98.4 F (36.9 C) Oral 97 16 5\' 2"  (1.575 m) 163 lb (73.9 kg)   Constitutional: Well-developed, well-nourished female in no acute distress.  Cardiovascular: normal rate Respiratory: normal effort GI: Abd soft, mild, diffuse, right lower side tenderness. Neg rebound tenderness, guarding, or mass. gravid appropriate for gestational age. Pos BS x 4 MS: Extremities nontender, no edema, normal ROM Neurologic: Alert and oriented x 4.  GU: Neg CVAT.  Pelvic: NEFG, moderate amount of thin, Wence, malodorous discharge, no blood, cervix clean. No CMT  Dilation: 1 Effacement (%): Thick Exam by:: Ivonne AndrewV Aniyah Nobis CNM  FHT:  Baseline 140 , moderate variability, 15x15 accelerations present, no  decelerations Contractions: rare UC's traced, but several palpated. Monitor adjusted,    Labs: Results for orders placed or performed during the hospital encounter of 03/03/17 (from the past 24 hour(s))  Urinalysis, Routine w reflex microscopic     Status: Abnormal   Collection Time: 03/03/17  9:15 AM  Result Value Ref Range   Color, Urine YELLOW YELLOW   APPearance CLOUDY (A) CLEAR   Specific Gravity, Urine 1.014 1.005 - 1.030   pH 6.0 5.0 - 8.0   Glucose, UA NEGATIVE NEGATIVE mg/dL   Hgb urine dipstick SMALL (A) NEGATIVE   Bilirubin Urine NEGATIVE NEGATIVE   Ketones, ur NEGATIVE NEGATIVE mg/dL   Protein, ur NEGATIVE NEGATIVE mg/dL   Nitrite NEGATIVE NEGATIVE   Leukocytes, UA LARGE (A) NEGATIVE   RBC / HPF 0-5 0 - 5 RBC/hpf   WBC, UA TOO NUMEROUS TO COUNT 0 - 5 WBC/hpf   Bacteria, UA RARE (A) NONE SEEN   Squamous Epithelial / LPF 6-30 (A) NONE SEEN   Mucous PRESENT   Wet prep, genital     Status: Abnormal   Collection Time: 03/03/17 10:20 AM  Result Value Ref Range   Yeast Wet Prep HPF POC NONE SEEN NONE SEEN   Trich, Wet Prep NONE SEEN NONE SEEN   Clue Cells Wet Prep HPF POC PRESENT (A) NONE SEEN   WBC, Wet Prep HPF POC FEW (A) NONE SEEN   Sperm NONE SEEN   Fetal fibronectin     Status: None   Collection Time: 03/03/17 10:20 AM  Result Value Ref Range   Fetal Fibronectin NEGATIVE NEGATIVE  CBC with Differential/Platelet     Status: Abnormal   Collection Time: 03/03/17 10:31 AM  Result Value Ref Range   WBC 11.3 (H) 4.0 - 10.5 K/uL   RBC 4.38 3.87 - 5.11 MIL/uL   Hemoglobin 12.7 12.0 - 15.0 g/dL   HCT 40.337.6 47.436.0 - 25.946.0 %   MCV 85.8 78.0 - 100.0 fL   MCH 29.0 26.0 - 34.0 pg   MCHC 33.8 30.0 - 36.0 g/dL   RDW 56.315.0 87.511.5 - 64.315.5 %   Platelets 237 150 - 400 K/uL   Neutrophils Relative % 77 %   Neutro Abs 8.8 (H) 1.7 - 7.7 K/uL   Lymphocytes Relative 18 %   Lymphs Abs 2.0 0.7 - 4.0 K/uL   Monocytes Relative 4 %   Monocytes Absolute 0.4 0.1 - 1.0 K/uL   Eosinophils  Relative 1 %   Eosinophils Absolute 0.1 0.0 - 0.7 K/uL   Basophils Relative 0 %   Basophils Absolute 0.0 0.0 - 0.1 K/uL    Imaging:  No results found.  MAU Course: Orders Placed This Encounter  Procedures  . Wet prep,  genital  . Culture, OB Urine  . Urinalysis, Routine w reflex microscopic  . CBC with Differential/Platelet  . Fetal fibronectin   Meds ordered this encounter  Medications  . ibuprofen (ADVIL,MOTRIN) tablet 600 mg  . levothyroxine (SYNTHROID, LEVOTHROID) 50 MCG tablet    Refill:  0  . betamethasone acetate-betamethasone sodium phosphate (CELESTONE) injection 12 mg   Felling better after procardia and ibuprofen. No cervical change. Will give BMZ due to dilation at 27 weeks.   MDM: - Preterm contractions w/ out evidence of active preterm labor.   Assessment: 1. Preterm uterine contractions in second trimester, antepartum   2. BV (bacterial vaginosis)     Plan: Discharge home in stable condition.  preterm Labor precautions and fetal kick counts   Follow-up Information    [facility] Follow up on 03/04/2017.   Specialty:  Obstetrics and Gynecology Why:  at 1:00 pm for second injection Contact information: [address] [place] [address] [phone]           Allergies as of 03/03/2017   No Known Allergies     Medication List    TAKE these medications   levothyroxine 50 MCG tablet Commonly known as:  SYNTHROID, LEVOTHROID   metroNIDAZOLE 500 MG tablet Commonly known as:  FLAGYL Take 1 tablet (500 mg total) by mouth 2 (two) times daily.   PRENATAL VITAMIN PO Take by mouth.       Katrinka Blazing, [place], CNM 03/03/2017 12:50 PM

## 2017-03-04 ENCOUNTER — Ambulatory Visit (INDEPENDENT_AMBULATORY_CARE_PROVIDER_SITE_OTHER): Payer: Medicaid Other | Admitting: *Deleted

## 2017-03-04 VITALS — BP 109/65 | HR 70

## 2017-03-04 DIAGNOSIS — O4702 False labor before 37 completed weeks of gestation, second trimester: Secondary | ICD-10-CM

## 2017-03-04 LAB — CULTURE, OB URINE: Culture: <10000 — AB

## 2017-03-04 LAB — GC/CHLAMYDIA PROBE AMP (~~LOC~~) NOT AT ARMC
CHLAMYDIA, DNA PROBE: NEGATIVE
Neisseria Gonorrhea: NEGATIVE

## 2017-03-04 MED ORDER — BETAMETHASONE SOD PHOS & ACET 6 (3-3) MG/ML IJ SUSP
12.0000 mg | Freq: Once | INTRAMUSCULAR | Status: AC
  Administered 2017-03-04: 12 mg via INTRAMUSCULAR

## 2017-03-04 NOTE — Progress Notes (Signed)
Betamethasone 12 mg IM administered as scheduled (2nd dose).  Pt tolerated well.  Pt receives prenatal care @ [facility] and has next scheduled appt.

## 2017-04-18 ENCOUNTER — Inpatient Hospital Stay (HOSPITAL_COMMUNITY)
Admission: AD | Admit: 2017-04-18 | Discharge: 2017-04-18 | Disposition: A | Discharge status: Home / Self Care | Payer: Medicaid Other | Source: Ambulatory Visit | Attending: Obstetrics & Gynecology | Admitting: Obstetrics & Gynecology

## 2017-04-18 ENCOUNTER — Encounter (HOSPITAL_COMMUNITY): Payer: Self-pay | Admitting: *Deleted

## 2017-04-18 DIAGNOSIS — Z3A33 33 weeks gestation of pregnancy: Secondary | ICD-10-CM | POA: Diagnosis not present

## 2017-04-18 DIAGNOSIS — O99283 Endocrine, nutritional and metabolic diseases complicating pregnancy, third trimester: Secondary | ICD-10-CM | POA: Insufficient documentation

## 2017-04-18 DIAGNOSIS — R102 Pelvic and perineal pain: Secondary | ICD-10-CM

## 2017-04-18 DIAGNOSIS — O26713 Subluxation of symphysis (pubis) in pregnancy, third trimester: Secondary | ICD-10-CM | POA: Diagnosis not present

## 2017-04-18 DIAGNOSIS — O4703 False labor before 37 completed weeks of gestation, third trimester: Secondary | ICD-10-CM

## 2017-04-18 DIAGNOSIS — Z87891 Personal history of nicotine dependence: Secondary | ICD-10-CM | POA: Diagnosis not present

## 2017-04-18 DIAGNOSIS — E039 Hypothyroidism, unspecified: Secondary | ICD-10-CM | POA: Diagnosis not present

## 2017-04-18 DIAGNOSIS — O26899 Other specified pregnancy related conditions, unspecified trimester: Secondary | ICD-10-CM

## 2017-04-18 HISTORY — DX: Hypothyroidism, unspecified: E03.9

## 2017-04-18 LAB — WET PREP, GENITAL
Sperm: NONE SEEN
TRICH WET PREP: NONE SEEN
YEAST WET PREP: NONE SEEN

## 2017-04-18 LAB — URINALYSIS, ROUTINE W REFLEX MICROSCOPIC
BILIRUBIN URINE: NEGATIVE
Glucose, UA: NEGATIVE mg/dL
Hgb urine dipstick: NEGATIVE
KETONES UR: NEGATIVE mg/dL
Nitrite: NEGATIVE
Protein, ur: NEGATIVE mg/dL
Specific Gravity, Urine: 1.023 (ref 1.005–1.030)
pH: 6 (ref 5.0–8.0)

## 2017-04-18 LAB — FETAL FIBRONECTIN: FETAL FIBRONECTIN: NEGATIVE

## 2017-04-18 MED ORDER — BETAMETHASONE SOD PHOS & ACET 6 (3-3) MG/ML IJ SUSP
12.0000 mg | INTRAMUSCULAR | Status: DC
  Administered 2017-04-18: 12 mg via INTRAMUSCULAR
  Filled 2017-04-18: qty 2

## 2017-04-18 NOTE — MAU Note (Signed)
Pt reports she has had a sharp pelvic pain that started yesterday that makes it difficult for her to walk. Good fetal movement felt . C/o feeling of wetness in her underwear for the past 2 days.

## 2017-04-18 NOTE — MAU Provider Note (Signed)
History     CSN: [id number]  Arrival date and time: 04/18/17 1736   First Provider Initiated Contact with Patient 04/18/17 1807      Chief Complaint  Patient presents with  . Pelvic Pain   26 year old G2P1001 at [redacted]w[redacted]d presenting for lower pelvic pain. She has had lower pelvic pain for several weeks now but yesterday the pain became acutely worse. She describes a sharp pain 10/10 that left her unable to walk that continues today. Pain is located in the middle of her lower abdomen. The pain is resolved by resting and aggravated by movement. No trauma. The pain is not at a regular interval. She was recently seen in MAU on 03/03/17 for preterm uterine contractions but is unsure if today's pain is similar to her pain at that visit. She continues to feel fetal movements. She thinks she has had leakage of fluid over the last two weeks but denies continuous fluid loss. She attributes this to her mucus plug leaking two weeks ago. No fever or chills. No vaginal bleeding. Patient also noting some shortness of breath over the last few months progressively worsening.   OB History    Gravida Para Term Preterm AB Living   2 1 1  0 0 1   SAB TAB Ectopic Multiple Live Births   0 0 0 0 1      Obstetric Comments   Delivery note attached to prenatal record indicates 2014 low transverse c/s       Past Medical History:  Diagnosis Date  . Chlamydia   . Depression    Previously treated with Risperdal  . Hypothyroidism     Past Surgical History:  Procedure Laterality Date  . CESAREAN SECTION N/A 01/15/2013   Procedure: CESAREAN SECTION;  Surgeon: Kathreen Cosier, MD;  Location: [facility];  Service: Obstetrics;  Laterality: N/A;  . NOSE SURGERY    . WISDOM TOOTH EXTRACTION      Family History  Problem Relation Age of Onset  . Arthritis Father   . Birth defects Sister        spina bifida  . Asthma Brother   . Diabetes Maternal Uncle   . Thyroid disease Mother   . Other Neg Hx   . Alcohol  abuse Neg Hx   . Hypertension Neg Hx   . Hyperlipidemia Neg Hx   . Heart disease Neg Hx   . Hearing loss Neg Hx   . Early death Neg Hx   . Drug abuse Neg Hx   . Miscarriages / Stillbirths Neg Hx   . Mental retardation Neg Hx   . Mental illness Neg Hx   . Learning disabilities Neg Hx   . Kidney disease Neg Hx   . Stroke Neg Hx   . Vision loss Neg Hx     Social History  Substance Use Topics  . Smoking status: Former Smoker    Packs/day: 0.25    Types: Cigarettes  . Smokeless tobacco: Never Used  . Alcohol use No     Comment: occasional    Allergies: No Known Allergies  Prescriptions Prior to Admission  Medication Sig Dispense Refill Last Dose  . levothyroxine (SYNTHROID, LEVOTHROID) 50 MCG tablet   0 04/18/2017 at Unknown time  . Prenatal Vit-Fe Fumarate-FA (PRENATAL VITAMIN PO) Take 1 tablet by mouth daily.    04/18/2017 at Unknown time  . metroNIDAZOLE (FLAGYL) 500 MG tablet Take 1 tablet (500 mg total) by mouth 2 (two) times daily. (Patient not  taking: Reported on 04/18/2017) 14 tablet 0 Completed Course at Unknown time    Review of Systems  Constitutional: Negative.   Respiratory: Positive for shortness of breath. Negative for cough, chest tightness and wheezing.   Cardiovascular: Negative.   Gastrointestinal: Positive for abdominal pain.  Genitourinary: Positive for pelvic pain. Negative for dysuria, flank pain, frequency, hematuria, urgency, vaginal bleeding, vaginal discharge and vaginal pain.  Musculoskeletal: Negative.   Neurological: Negative.   Psychiatric/Behavioral: Negative.    Physical Exam   Blood pressure 109/65, pulse 87, temperature 98.3 F (36.8 C), temperature source Oral, resp. rate 18, last menstrual period 08/24/2016.  Physical Exam  Nursing note and vitals reviewed. Constitutional: She is oriented to person, place, and time. She appears well-developed and well-nourished. No distress.  Cardiovascular: Normal rate, regular rhythm and normal heart  sounds.  Exam reveals no gallop and no friction rub.   No murmur heard. Respiratory: Effort normal and breath sounds normal. No respiratory distress. She has no wheezes. She has no rales. She exhibits no tenderness.  GI: Soft. She exhibits no distension and no mass. There is no tenderness. There is no rebound and no guarding.  Genitourinary: Uterus is not tender. Cervix exhibits no motion tenderness, no discharge and no friability. Right adnexum displays no tenderness. Left adnexum displays no tenderness. No tenderness or bleeding in the vagina. Vaginal discharge found.  Genitourinary Comments: Cervix 4/60/-2, vtx   Musculoskeletal: Normal range of motion.  Neurological: She is alert and oriented to person, place, and time.  Skin: Skin is warm. She is not diaphoretic.  Psychiatric: She has a normal mood and affect. Her behavior is normal. Judgment and thought content normal.   Fetal heart tracings Baseline 140, moderate variability, accels present, no decels. Uterine irritability but witnessed contraction while in room  Results for orders placed or performed during the hospital encounter of 04/18/17 (from the past 24 hour(s))  Urinalysis, Routine w reflex microscopic     Status: Abnormal   Collection Time: 04/18/17  5:45 PM  Result Value Ref Range   Color, Urine YELLOW YELLOW   APPearance HAZY (A) CLEAR   Specific Gravity, Urine 1.023 1.005 - 1.030   pH 6.0 5.0 - 8.0   Glucose, UA NEGATIVE NEGATIVE mg/dL   Hgb urine dipstick NEGATIVE NEGATIVE   Bilirubin Urine NEGATIVE NEGATIVE   Ketones, ur NEGATIVE NEGATIVE mg/dL   Protein, ur NEGATIVE NEGATIVE mg/dL   Nitrite NEGATIVE NEGATIVE   Leukocytes, UA MODERATE (A) NEGATIVE   RBC / HPF 0-5 0 - 5 RBC/hpf   WBC, UA 0-5 0 - 5 WBC/hpf   Bacteria, UA RARE (A) NONE SEEN   Squamous Epithelial / LPF 6-30 (A) NONE SEEN   Mucous PRESENT   Wet prep, genital     Status: Abnormal   Collection Time: 04/18/17  7:09 PM  Result Value Ref Range   Yeast  Wet Prep HPF POC NONE SEEN NONE SEEN   Trich, Wet Prep NONE SEEN NONE SEEN   Clue Cells Wet Prep HPF POC PRESENT (A) NONE SEEN   WBC, Wet Prep HPF POC FEW (A) NONE SEEN   Sperm NONE SEEN   Fetal fibronectin     Status: None   Collection Time: 04/18/17  7:09 PM  Result Value Ref Range   Fetal Fibronectin NEGATIVE NEGATIVE    MAU Course  Procedures Betamethasone  MDM -Concerns for preterm labor given patient's potential contraction pattern and cervical change from 1 cm 03/03/17 to 4 cm today  -Betamethosone  today, also received betamethasone on 03/03/17 -GC/Chlamydia, wet prep, FFN, urinalysis done today  2001: Report and transfer of care given to H.Mathews RobinsonsHogan, CNM Donette LarryBhambri, Melanie, CNM  04/18/2017 8:01 PM   2214: no cervical change, 4/60/-2. DW the patient at length comfort measures for pubic symphysis dysfunction. Will see if we can get an abdominal binder for the patient. If not will send home with RX. Note given to be off work next week until next appointment at [facility] since she is on her feet for 8-12 hours/day. We will see if rest will help the pain.   Assessment and Plan   1. 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 Pain of round ligament affecting pregnancy, antepartum   3. Threatened premature labor in third trimester   4. [redacted] weeks gestation of pregnancy    DC home Comfort measures reviewed  3rd Trimester precautions  PTL precautions  Fetal kick counts RX: patient given an abdominal binder here.  Return to MAU as needed FU with OB as planned  Follow-up Information    [facility], [facility].   Contact information: [address] [place] [id number] [phone]

## 2017-04-18 NOTE — Discharge Instructions (Signed)

## 2017-04-21 LAB — GC/CHLAMYDIA PROBE AMP (~~LOC~~) NOT AT ARMC
Chlamydia: NEGATIVE
NEISSERIA GONORRHEA: NEGATIVE

## 2017-04-29 ENCOUNTER — Inpatient Hospital Stay (HOSPITAL_COMMUNITY)
Admission: AD | Admit: 2017-04-29 | Discharge: 2017-04-29 | Disposition: A | Discharge status: Home / Self Care | Payer: Medicaid Other | Source: Ambulatory Visit | Attending: Family Medicine | Admitting: Family Medicine

## 2017-04-29 DIAGNOSIS — Z87891 Personal history of nicotine dependence: Secondary | ICD-10-CM | POA: Insufficient documentation

## 2017-04-29 DIAGNOSIS — O4703 False labor before 37 completed weeks of gestation, third trimester: Secondary | ICD-10-CM | POA: Insufficient documentation

## 2017-04-29 DIAGNOSIS — Z3A35 35 weeks gestation of pregnancy: Secondary | ICD-10-CM | POA: Insufficient documentation

## 2017-04-29 NOTE — MAU Provider Note (Signed)
History     CSN: [id number]  Arrival date and time: 04/29/17 [id number]   First Provider Initiated Contact with Patient 04/29/17 2037      Chief Complaint  Patient presents with  . Contractions   Erin Bell is a 26 y.o. G2P1001 at 7148w3d who presents today with contractions. She reports that she has had contractions off and on for "weeks" today they were stronger. She denies any VB of LOF. She reports normal fetal movement. At her last visit she was 4/60/-2. She has a routine OB appt on 05/01/17    Pelvic Pain  The patient's primary symptoms include pelvic pain. The patient's pertinent negatives include no vaginal discharge. This is a new problem. The current episode started today. The problem occurs intermittently. The problem has been unchanged. The pain is moderate. The problem affects both sides. She is pregnant. Pertinent negatives include no chills, fever, nausea or vomiting. The vaginal discharge was normal. There has been no bleeding. Nothing aggravates the symptoms. She has tried nothing for the symptoms.      Past Medical History:  Diagnosis Date  . Chlamydia   . Depression    Previously treated with Risperdal  . Hypothyroidism     Past Surgical History:  Procedure Laterality Date  . CESAREAN SECTION N/A 01/15/2013   Procedure: CESAREAN SECTION;  Surgeon: Kathreen CosierBernard A Marshall, MD;  Location: [facility];  Service: Obstetrics;  Laterality: N/A;  . NOSE SURGERY    . WISDOM TOOTH EXTRACTION      Family History  Problem Relation Age of Onset  . Arthritis Father   . Birth defects Sister        spina bifida  . Asthma Brother   . Diabetes Maternal Uncle   . Thyroid disease Mother   . Other Neg Hx   . Alcohol abuse Neg Hx   . Hypertension Neg Hx   . Hyperlipidemia Neg Hx   . Heart disease Neg Hx   . Hearing loss Neg Hx   . Early death Neg Hx   . Drug abuse Neg Hx   . Miscarriages / Stillbirths Neg Hx   . Mental retardation Neg Hx   . Mental illness Neg Hx   . Learning  disabilities Neg Hx   . Kidney disease Neg Hx   . Stroke Neg Hx   . Vision loss Neg Hx     Social History  Substance Use Topics  . Smoking status: Former Smoker    Packs/day: 0.25    Types: Cigarettes  . Smokeless tobacco: Never Used  . Alcohol use No     Comment: occasional    Allergies: No Known Allergies  Prescriptions Prior to Admission  Medication Sig Dispense Refill Last Dose  . levothyroxine (SYNTHROID, LEVOTHROID) 50 MCG tablet Take 50 mcg by mouth daily before breakfast.   0 04/29/2017 at Unknown time  . Prenatal Vit-Fe Fumarate-FA (PRENATAL VITAMIN PO) Take 1 tablet by mouth daily.    04/29/2017 at Unknown time  . metroNIDAZOLE (FLAGYL) 500 MG tablet Take 1 tablet (500 mg total) by mouth 2 (two) times daily. (Patient not taking: Reported on 04/18/2017) 14 tablet 0 Completed Course at Unknown time    Review of Systems  Constitutional: Negative for chills and fever.  Gastrointestinal: Negative for nausea and vomiting.  Genitourinary: Positive for pelvic pain. Negative for vaginal bleeding and vaginal discharge.   Physical Exam   Blood pressure 113/70, pulse 97, temperature 98.3 F (36.8 C), temperature source Oral, resp. rate  18, height 5\' 2"  (1.575 m), weight 168 lb (76.2 kg), last menstrual period 08/24/2016, SpO2 100 %.  Physical Exam  Nursing note and vitals reviewed. Constitutional: She is oriented to person, place, and time. She appears well-developed and well-nourished. No distress.  HENT:  Head: Normocephalic.  Cardiovascular: Normal rate.   Respiratory: Effort normal.  GI: Soft. There is no tenderness. There is no rebound.  Genitourinary:  Genitourinary Comments: 4/70/-2  Neurological: She is alert and oriented to person, place, and time.  Skin: Skin is warm and dry.  Psychiatric: She has a normal mood and affect.  FHT: 145, moderate with 15x15 accels, no decels Toco: no UCs   MAU Course  Procedures  MDM Offered patient to wait and recheck in one  hour or DC home at this time. Patient would like to be DC home at this time. Return precautions reviewed.   Assessment and Plan   1. False labor before 37 completed weeks of gestation during pregnancy in third trimester, antepartum   2. [redacted] weeks gestation of pregnancy    DC home Comfort measures reviewed  3rd Trimester precautions  PTL precautions  Fetal kick counts RX: none  Return to MAU as needed FU with OB as planned  Follow-up Information    [facility]   Specialty:  Obstetrics and Gynecology Contact information: [address] [place] [id number] [phone]          Thressa ShellerHeather Phinley Schall 04/29/2017, 8:38 PM

## 2017-04-29 NOTE — Discharge Instructions (Signed)

## 2017-05-17 ENCOUNTER — Inpatient Hospital Stay (HOSPITAL_COMMUNITY)
Admission: AD | Admit: 2017-05-17 | Discharge: 2017-05-18 | DRG: 775 | Disposition: A | Discharge status: Home / Self Care | Payer: Medicaid Other | Source: Ambulatory Visit | Attending: Obstetrics and Gynecology | Admitting: Obstetrics and Gynecology

## 2017-05-17 ENCOUNTER — Inpatient Hospital Stay (HOSPITAL_COMMUNITY): Payer: Medicaid Other | Admitting: Anesthesiology

## 2017-05-17 ENCOUNTER — Encounter (HOSPITAL_COMMUNITY): Payer: Self-pay

## 2017-05-17 DIAGNOSIS — Z3A38 38 weeks gestation of pregnancy: Secondary | ICD-10-CM

## 2017-05-17 DIAGNOSIS — Z3493 Encounter for supervision of normal pregnancy, unspecified, third trimester: Secondary | ICD-10-CM | POA: Diagnosis present

## 2017-05-17 DIAGNOSIS — Z87891 Personal history of nicotine dependence: Secondary | ICD-10-CM

## 2017-05-17 DIAGNOSIS — O99324 Drug use complicating childbirth: Secondary | ICD-10-CM | POA: Diagnosis present

## 2017-05-17 DIAGNOSIS — E038 Other specified hypothyroidism: Secondary | ICD-10-CM | POA: Diagnosis present

## 2017-05-17 DIAGNOSIS — F1290 Cannabis use, unspecified, uncomplicated: Secondary | ICD-10-CM | POA: Diagnosis present

## 2017-05-17 DIAGNOSIS — O34211 Maternal care for low transverse scar from previous cesarean delivery: Principal | ICD-10-CM | POA: Diagnosis present

## 2017-05-17 DIAGNOSIS — O99284 Endocrine, nutritional and metabolic diseases complicating childbirth: Secondary | ICD-10-CM | POA: Diagnosis present

## 2017-05-17 DIAGNOSIS — Z98891 History of uterine scar from previous surgery: Secondary | ICD-10-CM

## 2017-05-17 LAB — CBC
HCT: 37.7 % (ref 36.0–46.0)
HEMOGLOBIN: 12.6 g/dL (ref 12.0–15.0)
MCH: 28.2 pg (ref 26.0–34.0)
MCHC: 33.4 g/dL (ref 30.0–36.0)
MCV: 84.3 fL (ref 78.0–100.0)
PLATELETS: 264 10*3/uL (ref 150–400)
RBC: 4.47 MIL/uL (ref 3.87–5.11)
RDW: 16.4 % — AB (ref 11.5–15.5)
WBC: 12.8 10*3/uL — ABNORMAL HIGH (ref 4.0–10.5)

## 2017-05-17 LAB — ABO/RH: ABO/RH(D): B POS

## 2017-05-17 LAB — TYPE AND SCREEN
ABO/RH(D): B POS
Antibody Screen: NEGATIVE

## 2017-05-17 LAB — RPR: RPR: NONREACTIVE

## 2017-05-17 MED ORDER — SIMETHICONE 80 MG PO CHEW: 80.0000 mg | CHEWABLE_TABLET | ORAL | Status: DC | PRN

## 2017-05-17 MED ORDER — SOD CITRATE-CITRIC ACID 500-334 MG/5ML PO SOLN: 30.0000 mL | ORAL | Status: DC | PRN

## 2017-05-17 MED ORDER — LIDOCAINE HCL (PF) 1 % IJ SOLN
30.0000 mL | INTRAMUSCULAR | Status: DC | PRN
  Filled 2017-05-17: qty 30

## 2017-05-17 MED ORDER — OXYTOCIN 40 UNITS IN LACTATED RINGERS INFUSION - SIMPLE MED
2.5000 [IU]/h | INTRAVENOUS | Status: DC
  Administered 2017-05-17: 2.5 [IU]/h via INTRAVENOUS
  Filled 2017-05-17: qty 1000

## 2017-05-17 MED ORDER — ONDANSETRON HCL 4 MG/2ML IJ SOLN: 4.0000 mg | INTRAMUSCULAR | Status: DC | PRN

## 2017-05-17 MED ORDER — ACETAMINOPHEN 325 MG PO TABS
650.0000 mg | ORAL_TABLET | ORAL | Status: DC | PRN
  Administered 2017-05-17: 650 mg via ORAL
  Filled 2017-05-17: qty 2

## 2017-05-17 MED ORDER — FENTANYL 2.5 MCG/ML BUPIVACAINE 1/10 % EPIDURAL INFUSION (WH - ANES)
14.0000 mL/h | INTRAMUSCULAR | Status: DC | PRN
  Administered 2017-05-17: 14 mL/h via EPIDURAL
  Filled 2017-05-17: qty 100

## 2017-05-17 MED ORDER — ACETAMINOPHEN 325 MG PO TABS: 650.0000 mg | ORAL_TABLET | ORAL | Status: DC | PRN

## 2017-05-17 MED ORDER — FENTANYL 2.5 MCG/ML BUPIVACAINE 1/10 % EPIDURAL INFUSION (WH - ANES): 14.0000 mL/h | INTRAMUSCULAR | Status: DC | PRN

## 2017-05-17 MED ORDER — PRENATAL MULTIVITAMIN CH
1.0000 | ORAL_TABLET | Freq: Every day | ORAL | Status: DC
  Administered 2017-05-17 – 2017-05-18 (×2): 1 via ORAL
  Filled 2017-05-17 (×2): qty 1

## 2017-05-17 MED ORDER — COCONUT OIL OIL: 1.0000 "application " | TOPICAL_OIL | Status: DC | PRN

## 2017-05-17 MED ORDER — DIBUCAINE 1 % RE OINT: 1.0000 "application " | TOPICAL_OINTMENT | RECTAL | Status: DC | PRN

## 2017-05-17 MED ORDER — OXYCODONE-ACETAMINOPHEN 5-325 MG PO TABS: 2.0000 | ORAL_TABLET | ORAL | Status: DC | PRN

## 2017-05-17 MED ORDER — TETANUS-DIPHTH-ACELL PERTUSSIS 5-2.5-18.5 LF-MCG/0.5 IM SUSP: 0.5000 mL | Freq: Once | INTRAMUSCULAR | Status: DC

## 2017-05-17 MED ORDER — OXYCODONE-ACETAMINOPHEN 5-325 MG PO TABS: 1.0000 | ORAL_TABLET | ORAL | Status: DC | PRN

## 2017-05-17 MED ORDER — OXYCODONE-ACETAMINOPHEN 5-325 MG PO TABS
1.0000 | ORAL_TABLET | ORAL | Status: DC | PRN
  Administered 2017-05-17 – 2017-05-18 (×3): 1 via ORAL
  Filled 2017-05-17 (×3): qty 1

## 2017-05-17 MED ORDER — ZOLPIDEM TARTRATE 5 MG PO TABS: 5.0000 mg | ORAL_TABLET | Freq: Every evening | ORAL | Status: DC | PRN

## 2017-05-17 MED ORDER — EPHEDRINE 5 MG/ML INJ
10.0000 mg | INTRAVENOUS | Status: DC | PRN
Start: 2017-05-17 — End: 2017-05-17
  Filled 2017-05-17: qty 2

## 2017-05-17 MED ORDER — WITCH HAZEL-GLYCERIN EX PADS: 1.0000 "application " | MEDICATED_PAD | CUTANEOUS | Status: DC | PRN

## 2017-05-17 MED ORDER — PHENYLEPHRINE 40 MCG/ML (10ML) SYRINGE FOR IV PUSH (FOR BLOOD PRESSURE SUPPORT)
80.0000 ug | PREFILLED_SYRINGE | INTRAVENOUS | Status: DC | PRN
  Filled 2017-05-17: qty 10
  Filled 2017-05-17: qty 5

## 2017-05-17 MED ORDER — BENZOCAINE-MENTHOL 20-0.5 % EX AERO: 1.0000 "application " | INHALATION_SPRAY | CUTANEOUS | Status: DC | PRN

## 2017-05-17 MED ORDER — IBUPROFEN 600 MG PO TABS
600.0000 mg | ORAL_TABLET | Freq: Four times a day (QID) | ORAL | Status: DC
  Administered 2017-05-17 – 2017-05-18 (×5): 600 mg via ORAL
  Filled 2017-05-17 (×5): qty 1

## 2017-05-17 MED ORDER — ONDANSETRON HCL 4 MG PO TABS: 4.0000 mg | ORAL_TABLET | ORAL | Status: DC | PRN

## 2017-05-17 MED ORDER — DIPHENHYDRAMINE HCL 50 MG/ML IJ SOLN: 12.5000 mg | INTRAMUSCULAR | Status: DC | PRN

## 2017-05-17 MED ORDER — EPHEDRINE 5 MG/ML INJ
10.0000 mg | INTRAVENOUS | Status: DC | PRN
  Filled 2017-05-17: qty 2

## 2017-05-17 MED ORDER — FENTANYL CITRATE (PF) 100 MCG/2ML IJ SOLN: 50.0000 ug | INTRAMUSCULAR | Status: DC | PRN

## 2017-05-17 MED ORDER — LACTATED RINGERS IV SOLN
INTRAVENOUS | Status: DC
  Administered 2017-05-17: 03:00:00 via INTRAVENOUS

## 2017-05-17 MED ORDER — LACTATED RINGERS IV SOLN
500.0000 mL | Freq: Once | INTRAVENOUS | Status: AC
  Administered 2017-05-17: 500 mL via INTRAVENOUS

## 2017-05-17 MED ORDER — SENNOSIDES-DOCUSATE SODIUM 8.6-50 MG PO TABS
2.0000 | ORAL_TABLET | ORAL | Status: DC
  Administered 2017-05-17: 2 via ORAL
  Filled 2017-05-17: qty 2

## 2017-05-17 MED ORDER — FLEET ENEMA 7-19 GM/118ML RE ENEM: 1.0000 | ENEMA | RECTAL | Status: DC | PRN

## 2017-05-17 MED ORDER — ONDANSETRON HCL 4 MG/2ML IJ SOLN: 4.0000 mg | Freq: Four times a day (QID) | INTRAMUSCULAR | Status: DC | PRN

## 2017-05-17 MED ORDER — LEVOTHYROXINE SODIUM 50 MCG PO TABS
50.0000 ug | ORAL_TABLET | Freq: Every day | ORAL | Status: DC
  Administered 2017-05-17 – 2017-05-18 (×2): 50 ug via ORAL
  Filled 2017-05-17 (×3): qty 1

## 2017-05-17 MED ORDER — OXYTOCIN BOLUS FROM INFUSION
500.0000 mL | Freq: Once | INTRAVENOUS | Status: AC
Start: 2017-05-17 — End: 2017-05-17
  Administered 2017-05-17: 500 mL via INTRAVENOUS

## 2017-05-17 MED ORDER — LIDOCAINE HCL (PF) 1 % IJ SOLN
INTRAMUSCULAR | Status: DC | PRN
  Administered 2017-05-17 (×2): 4 mL

## 2017-05-17 MED ORDER — PHENYLEPHRINE 40 MCG/ML (10ML) SYRINGE FOR IV PUSH (FOR BLOOD PRESSURE SUPPORT)
80.0000 ug | PREFILLED_SYRINGE | INTRAVENOUS | Status: DC | PRN
  Filled 2017-05-17: qty 5

## 2017-05-17 MED ORDER — DIPHENHYDRAMINE HCL 25 MG PO CAPS: 25.0000 mg | ORAL_CAPSULE | Freq: Four times a day (QID) | ORAL | Status: DC | PRN

## 2017-05-17 MED ORDER — LACTATED RINGERS IV SOLN
500.0000 mL | INTRAVENOUS | Status: DC | PRN
  Administered 2017-05-17 (×2): 500 mL via INTRAVENOUS

## 2017-05-17 NOTE — Progress Notes (Signed)
Patient is a 26 y.o. now G2P1001 who admitted for SOL, now s/p NSVD at [redacted]w[redacted]d.  Delivery Note At 5:31 AM a viable female was delivered via VBAC, Spontaneous (Presentation: ROA).  APGAR: 8, 9; weight Pending skin to skin.   Placenta status: Intact.  Cord: intact, 3 vessel with the following complications: tight double nuchal cord, delivered through and successfully reduced prior to infant to mother's abdomen.  Anesthesia: Epidural Episiotomy: None Lacerations: None Suture Repair: none Est. Blood Loss (mL): 200  Mom to postpartum.  Baby to Couplet care / Skin to Skin.  Head delivered ROA. Tight double nuchal cord present, unable to immediately reduce, delivered through and successfully reduced prior to placing infant to mothers abdomen. Cord remained intact. Shoulder and body delivered in usual fashion. Cord clamped x 2 after 1-minute delay, and cut by husband. Cord blood drawn. Placenta delivered spontaneously with gentle cord traction and maternal pushing. Scant, friable trailing membrane noted. Fundus firm with massage and Pitocin. Labia and Perineum inspected and found to have no lacerations. Good hemostasis achieved.  Dannette Barbara, Medical Student 05/17/2017, 6:25 AM

## 2017-05-17 NOTE — Anesthesia Procedure Notes (Signed)
Epidural Patient location during procedure: OB  Staffing Anesthesiologist: Lexus Shampine Performed: anesthesiologist   Preanesthetic Checklist Completed: patient identified, pre-op evaluation, timeout performed, IV checked, risks and benefits discussed and monitors and equipment checked  Epidural Patient position: sitting Prep: site prepped and draped and DuraPrep Patient monitoring: heart rate Approach: midline Location: L3-L4 Injection technique: LOR air and LOR saline  Needle:  Needle type: Tuohy  Needle gauge: 17 G Needle length: 9 cm Needle insertion depth: 6 cm Catheter type: closed end flexible Catheter size: 19 Gauge Catheter at skin depth: 12 cm Test dose: negative  Assessment Sensory level: T8 Events: blood not aspirated, injection not painful, no injection resistance, negative IV test and no paresthesia  Additional Notes Reason for block:procedure for pain     

## 2017-05-17 NOTE — Progress Notes (Signed)
CSW received consult for hx of marijuana use.  Referral was screened out due to the following: ~MOB had no documented substance use after initial prenatal visit/+UPT. ~MOB had no positive drug screens after initial prenatal visit/+UPT. ~Baby's UDS is negative.  Please consult CSW if current concerns arise or by MOB's request.  CSW will monitor CDS results and make report to Child Protective Services if warranted.  Lova Urbieta, MSW, LCSW-A Clinical Social Worker  [facility]  Office: [phone]   

## 2017-05-17 NOTE — Progress Notes (Signed)
Patient ID: Erin Bell, female   DOB: [birth year], 26 y.o.   MRN: [id number]  Mostly comfortable w/ epidural; some rectal pressure VSS, afeb FHR 130s, +accels, occ variables Ctx q 3-4 mins, spont Cx C/C/vtx +1/+2  IUP@term  TOLAC End 1st stage  AROM for clear fluid Begin pushing if feeling pressure/urge Anticipate VBAC  Cam Hai 05/17/2017

## 2017-05-17 NOTE — Lactation Note (Signed)
This note was copied from a baby's chart. Lactation Consultation Note: Mother is an experienced breastfeeding mother. She breastfeed her second for 2 months. Mother reports that infant just finished breastfeeding. Mother reports that she see colostrum when she hand expresses. Advised mother to page for staff nurse to see feeding with next latch and listen for swallows. Mother thinks she hear swallowing. Mother request a hand pump. A harmony hand pump was given with instructions. Mother reports that she remembers how to use pump. Mother wants to post pump to see how much she can get. Reviewed supply and demand and reminded mother that milk doesn't come to volume until the 3 rd day. Mother encouraged to cue base feed and to feed at least 8-12 times in 24 hours. Mother was given [facility] brochure and informed of all available LC services.   Patient Name: Boy Elidia Rosen [id number]'F Date: 05/17/2017 Reason for consult: Initial assessment   Maternal Data Has patient been taught Hand Expression?: Yes Does the patient have breastfeeding experience prior to this delivery?: Yes  Feeding Feeding Type: Breast Fed Length of feed: 15 min  LATCH Score Latch: Repeated attempts needed to sustain latch, nipple held in mouth throughout feeding, stimulation needed to elicit sucking reflex.  Audible Swallowing: None  Type of Nipple: Everted at rest and after stimulation  Comfort (Breast/Nipple): Soft / non-tender  Hold (Positioning): Assistance needed to correctly position infant at breast and maintain latch.  LATCH Score: 6  Interventions Interventions: Breast feeding basics reviewed;Skin to skin;Hand express;Expressed milk;Hand pump  Lactation Tools Discussed/Used     Consult Status Consult Status: Follow-up Date: 05/17/17 Follow-up type: In-patient    Stevan Born [facility] 05/17/2017, 4:13 PM

## 2017-05-17 NOTE — Anesthesia Postprocedure Evaluation (Signed)
Anesthesia Post Note  Patient: Erin Bell  Procedure(s) Performed: * No procedures listed *     Patient location during evaluation: Mother Baby Anesthesia Type: Epidural Level of consciousness: awake Pain management: pain level controlled Vital Signs Assessment: post-procedure vital signs reviewed and stable Respiratory status: spontaneous breathing Cardiovascular status: stable Postop Assessment: no headache, no backache, epidural receding, patient able to bend at knees, no signs of nausea or vomiting and adequate PO intake Anesthetic complications: no    Last Vitals:  Vitals:   05/17/17 0815 05/17/17 1230  BP: 113/63 (!) 99/55  Pulse: 92 68  Resp: 18 18  Temp: 36.8 C 37 C  SpO2: 100% 99%    Last Pain:  Vitals:   05/17/17 1451  TempSrc:   PainSc: 5    Pain Goal:                 Erin Bell

## 2017-05-17 NOTE — H&P (Signed)
LABOR AND DELIVERY ADMISSION HISTORY AND PHYSICAL NOTE  Erin Bell is a 26 y.o. female G2P1001 with IUP at [redacted]w[redacted]d by 1st trimester [place] presenting for SOL.   Has been experiencing contractions steadily all day. She reports positive fetal movement. She denies leakage of fluid or vaginal bleeding. Pregnancy has been normal so far with exception of maternal hypothyroidism on synthroid. Hx of prior c-section for breech presentation, VBAC consent signed.  Prenatal History/Complications:  Past Medical History: Past Medical History:  Diagnosis Date  . Chlamydia   . Depression    Previously treated with Risperdal  . Hypothyroidism     Past Surgical History: Past Surgical History:  Procedure Laterality Date  . CESAREAN SECTION N/A 01/15/2013   Procedure: CESAREAN SECTION;  Surgeon: Kathreen Cosier, MD;  Location: [facility];  Service: Obstetrics;  Laterality: N/A;  . NOSE SURGERY    . WISDOM TOOTH EXTRACTION      Obstetrical History: OB History    Gravida Para Term Preterm AB Living   2 1 1  0 0 1   SAB TAB Ectopic Multiple Live Births   0 0 0 0 1      Obstetric Comments   Delivery note attached to prenatal record indicates 2014 low transverse c/s       Social History: Social History   Social History  . Marital status: Single    Spouse name: N/A  . Number of children: N/A  . Years of education: N/A   Social History Main Topics  . Smoking status: Former Smoker    Packs/day: 0.25    Types: Cigarettes  . Smokeless tobacco: Never Used  . Alcohol use No     Comment: occasional  . Drug use: No  . Sexual activity: Yes    Birth control/ protection: Other-see comments   Other Topics Concern  . None   Social History Narrative  . None    Family History: Family History  Problem Relation Age of Onset  . Arthritis Father   . Birth defects Sister        spina bifida  . Asthma Brother   . Diabetes Maternal Uncle   . Thyroid disease Mother   . Other Neg Hx   .  Alcohol abuse Neg Hx   . Hypertension Neg Hx   . Hyperlipidemia Neg Hx   . Heart disease Neg Hx   . Hearing loss Neg Hx   . Early death Neg Hx   . Drug abuse Neg Hx   . Miscarriages / Stillbirths Neg Hx   . Mental retardation Neg Hx   . Mental illness Neg Hx   . Learning disabilities Neg Hx   . Kidney disease Neg Hx   . Stroke Neg Hx   . Vision loss Neg Hx     Allergies: No Known Allergies  Prescriptions Prior to Admission  Medication Sig Dispense Refill Last Dose  . levothyroxine (SYNTHROID, LEVOTHROID) 50 MCG tablet Take 50 mcg by mouth daily before breakfast.   0 Past Month at Unknown time  . Prenatal Vit-Fe Fumarate-FA (PRENATAL VITAMIN PO) Take 1 tablet by mouth daily.    Past Month at Unknown time  . metroNIDAZOLE (FLAGYL) 500 MG tablet Take 1 tablet (500 mg total) by mouth 2 (two) times daily. (Patient not taking: Reported on 04/18/2017) 14 tablet 0 Completed Course at Unknown time     Review of Systems   All systems reviewed and negative except as stated in HPI  Blood pressure 112/65,  pulse 92, temperature 97.6 F (36.4 C), temperature source Oral, resp. rate 18, last menstrual period 08/24/2016, SpO2 100 %. General appearance: alert and cooperative Lungs: no respiratory distress Heart: regular rate Abdomen: gravid abdomen Extremities: No calf swelling or tenderness Presentation: cephalic by exam Fetal monitoring: baseline 145 Uterine activity: contractions q4 mins Dilation: 7 Effacement (%): 90 Station: -2 Exam by:: benji Forensic scientist  Prenatal labs: ABO, Rh:   B+ Antibody:   neg Rubella: immune RPR:   NR HBsAg:   neg HIV:   NR GBS:   neg Genetic screening:  MSAFP normal Anatomy US: normal  Prenatal Transfer Tool  Maternal Diabetes: No Genetic Screening: Normal Maternal Ultrasounds/Referrals: Normal Fetal Ultrasounds or other Referrals:  Referred to Materal Fetal Medicine  Maternal Substance Abuse:  Yes:  Type: Marijuana first trimester, subsequent  UDS negative x 2 Significant Maternal Medications:  Meds include: Syntroid Significant Maternal Lab Results: Lab values include: Group B Strep negative  No results found for this or any previous visit (from the past 24 hour(s)).  Patient Active Problem List   Diagnosis Date Noted  . Normal labor 05/17/2017  . Hereditary disease in family possibly affecting fetus, fetus 1   . Hyperthyroidism 03/18/2014    Assessment: Erin Bell is a 26 y.o. G2P1001 at [redacted]w[redacted]d here for SOL, VBAC consent signed- prior c-section in 2014 for breech delivery. PMH of depression, hypothyroidism on synthroid , and THC use early in pregnancy.  #Labor: SOL #Pain: epidural #FWB: Cat 1 tracing #ID:  GBS neg #MOF: breast #MOC: nexplanon #Circ:  yes  Tillman Sers, DO PGY-2 8/18/20182:48 AM  CNM attestation:  I have seen and examined this patient; I agree with above documentation in the resident's note.   Erin Bell is a 26 y.o. G2P1001 here for SOL, TOLAC (prior C/S for breech)  PE: BP 107/74   Pulse 76   Temp 97.6 F (36.4 C) (Oral)   Resp 18   Ht 5\' 2"  (1.575 m)   Wt 78.5 kg (173 lb)   LMP 08/24/2016   SpO2 97%   BMI 31.64 kg/m   Resp: normal effort, no distress Abd: gravid  ROS, labs, PMH reviewed  Plan: Admit to [facility] Expectant management Hopeful for VBAC  Cam Hai CNM 05/17/2017, 5:25 AM

## 2017-05-17 NOTE — Anesthesia Preprocedure Evaluation (Signed)
Anesthesia Evaluation  Patient identified by MRN, date of birth, ID band Patient awake    Reviewed: Allergy & Precautions, H&P , Patient's Chart, lab work & pertinent test results  Airway Mallampati: II  TM Distance: >3 FB Neck ROM: full    Dental no notable dental hx.    Pulmonary former smoker,    Pulmonary exam normal breath sounds clear to auscultation       Cardiovascular Exercise Tolerance: Good  Rhythm:regular Rate:Normal     Neuro/Psych    GI/Hepatic   Endo/Other  Hypothyroidism Hyperthyroidism   Renal/GU      Musculoskeletal   Abdominal   Peds  Hematology   Anesthesia Other Findings   Reproductive/Obstetrics                             Anesthesia Physical  Anesthesia Plan  ASA: II  Anesthesia Plan: Epidural   Post-op Pain Management:    Induction:   PONV Risk Score and Plan:   Airway Management Planned:   Additional Equipment:   Intra-op Plan:   Post-operative Plan:   Informed Consent: I have reviewed the patients History and Physical, chart, labs and discussed the procedure including the risks, benefits and alternatives for the proposed anesthesia with the patient or authorized representative who has indicated his/her understanding and acceptance.   Dental Advisory Given  Plan Discussed with: CRNA  Anesthesia Plan Comments: ( )        Anesthesia Quick Evaluation

## 2017-05-17 NOTE — Progress Notes (Signed)
CSW received consult for hx of Anxiety and Depression. CSW met with MOB to offer support and complete assessment.   Upon this writers arrival, MOB was warm and welcome. CSW explained role and reasoning for visit. MOB noted understanding and was willing to talk further. CSW inquired about depression hx. MOB was fourth coming and noted she has had a hx of depression for a few years. She noted she was previously on medication but has since stopped. MOB noted she does not feel she needs meds at this time. CSW thanked MOB for being open and honest and praised her for being self-aware and being able to cope without meds. MOB was thankful. CSW provided education regarding the baby blues period vs. perinatal mood disorders, discussed treatment and gave resources for mental health follow up if concerns arise. CSW recommends self-evaluation during the postpartum time period using the New Mom Checklist from Postpartum Progress and encouraged MOB to contact a medical professional if symptoms are noted at any time. CSW provided review of Sudden Infant Death Syndrome (SIDS) precautions.  CSW identifies no further need for intervention and no barriers to discharge at this time.  Erin Bell, MSW, LCSW-A Clinical Social Worker  [facility]  Office: [phone]  

## 2017-05-18 NOTE — Discharge Summary (Signed)
OB Discharge Summary     Patient Name: Erin Bell DOB: 1991-01-26 MRN: [id number]  Date of admission: 05/17/2017 Delivering MD: Dannette Barbara   Date of discharge: 05/18/2017  Admitting diagnosis: 38wks, contractions Intrauterine pregnancy: [redacted]w[redacted]d     Secondary diagnosis:  Active Problems:   Normal labor   History of cesarean delivery  Additional problems:Hypothyroidism     Discharge diagnosis: Term Pregnancy Delivered                                                                                                Post partum procedures:none  Augmentation: AROM  Complications: None  Hospital course:  Onset of Labor With Vaginal Delivery     26 y.o. yo [id number] at [redacted]w[redacted]d was admitted in Active Labor on 05/17/2017. Patient had an uncomplicated labor course as follows:  Membrane Rupture Time/Date: 5:11 AM ,05/17/2017   Intrapartum Procedures: Episiotomy: None [1]                                         Lacerations:  None [1]  Patient had a delivery of a Viable infant. 05/17/2017  Information for the patient's newborn:  Jesyca, Weisenburger [[id number]]  Delivery Method: VBAC, Spontaneous (Filed from Delivery Summary)    Pateint had an uncomplicated postpartum course.  She is ambulating, tolerating a regular diet, passing flatus, and urinating well. Patient is discharged home in stable condition on 05/18/17.   Physical exam  Vitals:   05/17/17 0700 05/17/17 0815 05/17/17 1230 05/17/17 1746  BP: 111/61 113/63 (!) 99/55 (!) 113/54  Pulse: 73 92 68 80  Resp: 18 18 18 18   Temp: 98.1 F (36.7 C) 98.2 F (36.8 C) 98.6 F (37 C) 98 F (36.7 C)  TempSrc: Oral Oral Oral Oral  SpO2: 100% 100% 99%   Weight:      Height:       General: alert, cooperative and no distress Lochia: appropriate Uterine Fundus: firm Incision: N/A DVT Evaluation: No evidence of DVT seen on physical exam. Labs: Lab Results  Component Value Date   WBC 12.8 (H) 05/17/2017   HGB 12.6 05/17/2017   HCT  37.7 05/17/2017   MCV 84.3 05/17/2017   PLT 264 05/17/2017   CMP Latest Ref Rng & Units 01/21/2010  Glucose 70 - 99 mg/dL -  BUN 6 - 23 mg/dL -  Creatinine 0.4 - 1.2 mg/dL -  Sodium 528 - 413 mEq/L -  Potassium 3.5 - 5.1 mEq/L -  Chloride 96 - 112 mEq/L -  Total Protein 6.0 - 8.3 g/dL 6.8  Total Bilirubin 0.3 - 1.2 mg/dL 0.3  Alkaline Phos 47 - 119 U/L 80  AST 0 - 37 U/L 16  ALT 0 - 35 U/L 17    Discharge instruction: per After Visit Summary and "Baby and Me Booklet".  After visit meds:  Allergies as of 05/18/2017   No Known Allergies     Medication List    STOP taking these medications   PRENATAL  VITAMIN PO     TAKE these medications   levothyroxine 50 MCG tablet Commonly known as:  SYNTHROID, LEVOTHROID Take 50 mcg by mouth daily before breakfast.       Diet: routine diet  Activity: Advance as tolerated. Pelvic rest for 6 weeks.   Outpatient follow up:6 weeks Follow up Appt:No future appointments. Follow up Visit:No Follow-up on file.  Postpartum contraception: Nexplanon  Newborn Data: Live born female  Birth Weight: 6 lb 1.7 oz (2770 g) APGAR: 8, 9  Baby Feeding: Breast Disposition:home with mother   05/18/2017 Marylene Land, CNM

## 2017-05-18 NOTE — Lactation Note (Signed)
This note was copied from a baby's chart. Lactation Consultation Note: Mother reports that breastfeed is going well. She reports that her nipples are slightly sore. Advised mother to continue to rotate positions frequently and bring infant close to breast. Mother to hand express colostrum and apply to nipples. No noted trama to nipples. Mother to ask nurse for coconut oil.  Mother to continue to cue base feed and feed at least 8-12 times . Mother is active with [facility] and she is aware of available LC services.  Patient Name: Erin Bell JSEGB'T Date: 05/18/2017 Reason for consult: Follow-up assessment   Maternal Data    Feeding Feeding Type: Breast Fed Length of feed: 30 min  LATCH Score                   Interventions    Lactation Tools Discussed/Used     Consult Status Consult Status: Complete    Michel Bickers 05/18/2017, 12:39 PM

## 2017-05-29 NOTE — Progress Notes (Signed)
Post discharge chart review completed.  

## 2018-01-17 IMAGING — US US OB COMP LESS 14 WK
1 series · 15 of 28 positions shown · non-contrast
Comparison: None.

CLINICAL DATA: Right lower quadrant abdominal and pelvic pain.

EXAM:
OBSTETRIC <14 WK US AND TRANSVAGINAL OB US
TECHNIQUE: Both transabdominal and transvaginal ultrasound examinations were
performed for complete evaluation of the gestation as well as the
maternal uterus, adnexal regions, and pelvic cul-de-sac.
Transvaginal technique was performed to assess early pregnancy.

[Series 1: us ob comp less 14 wk · 15 of 36 slices shown]
[im 1/36]
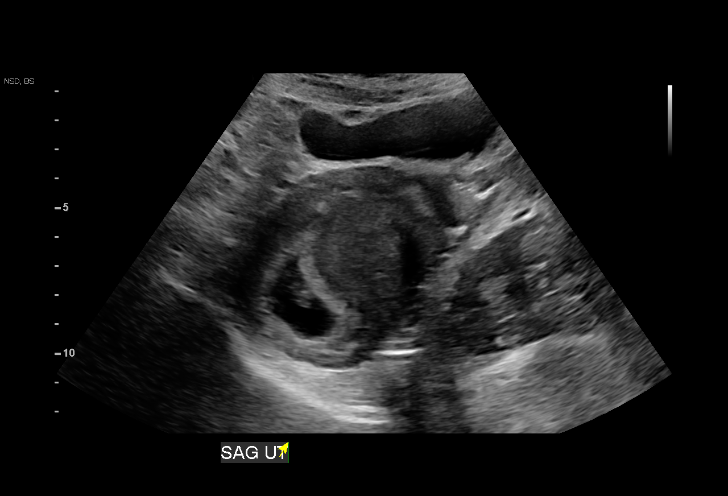
[im 3/36]
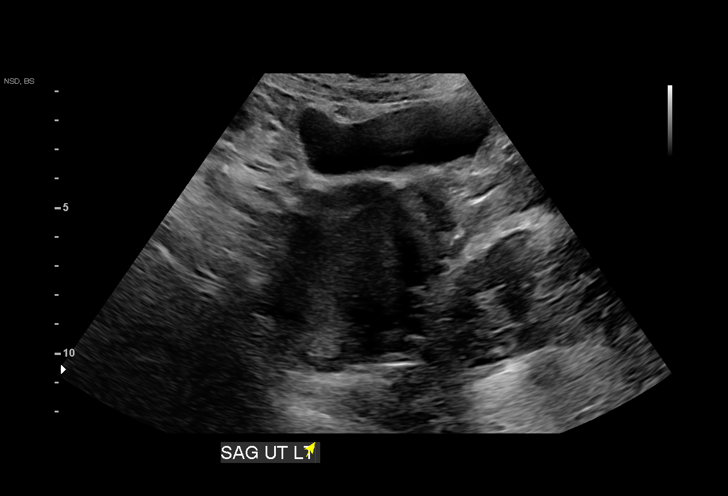
[im 6/36]
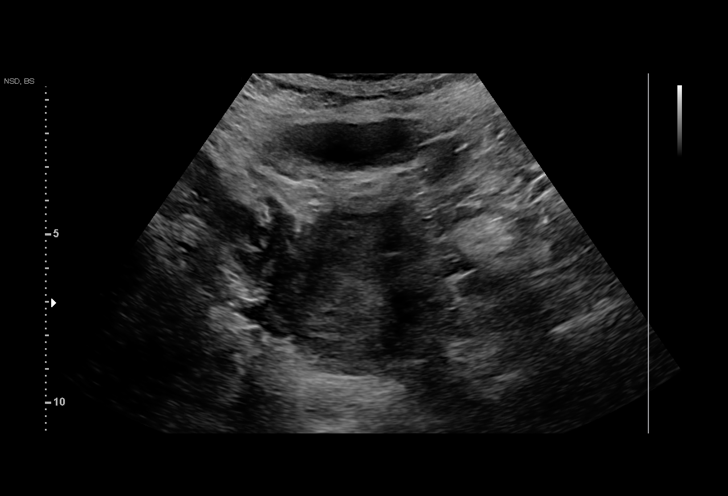
[im 8/36]
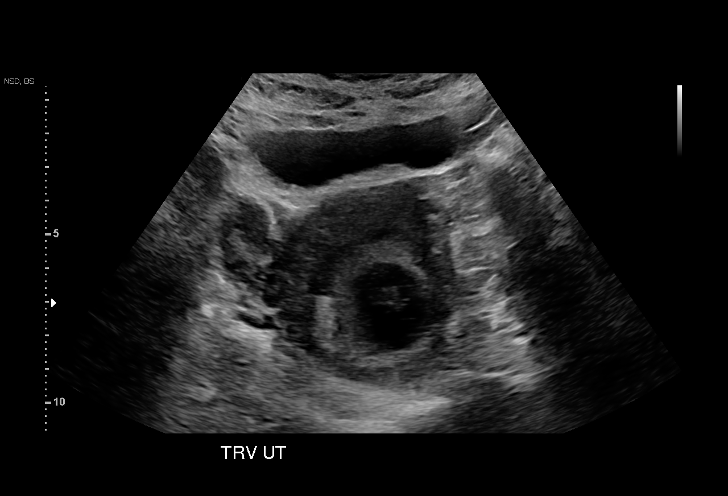
[im 11/36]
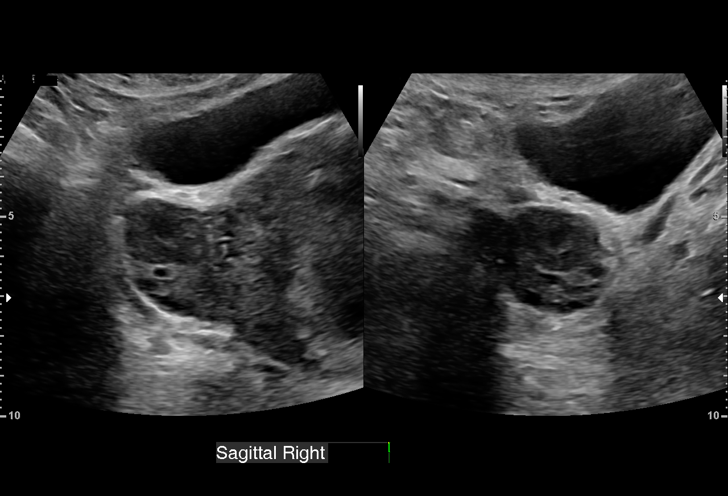
[im 13/36]
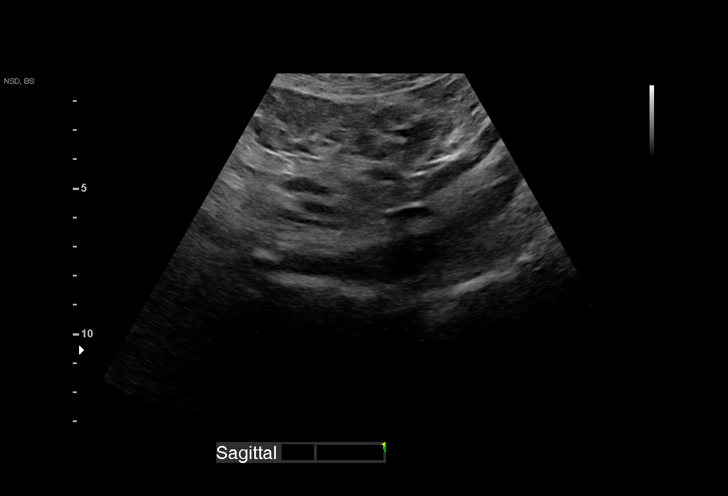
[im 16/36]
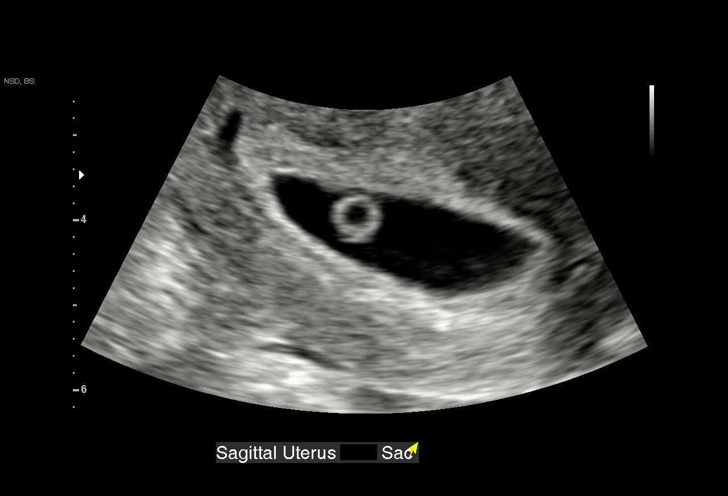
[im 19/36]
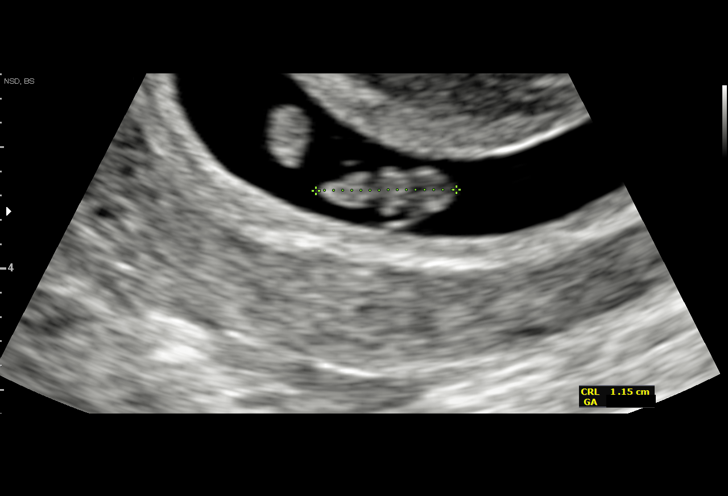
[im 20/36]
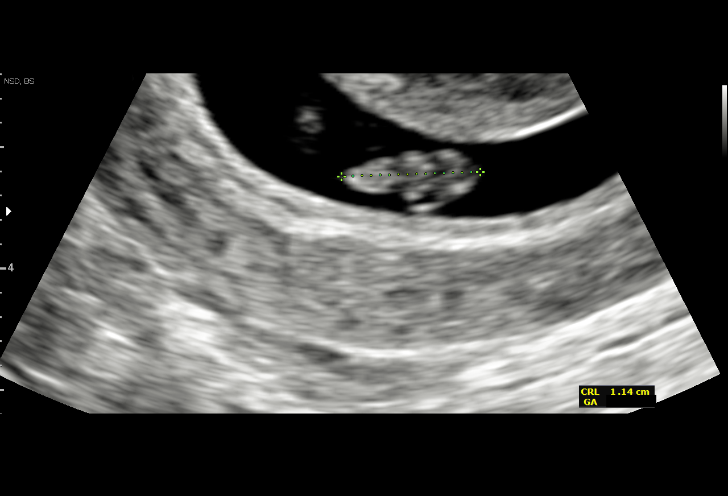
[im 23/36]
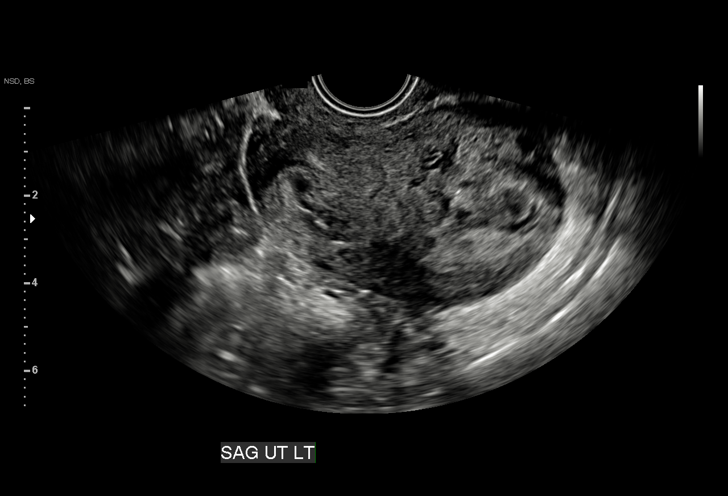
[im 25/36]
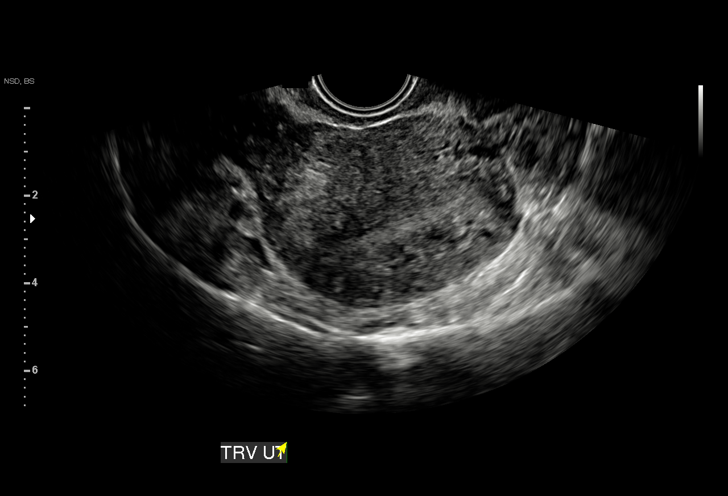
[im 28/36]
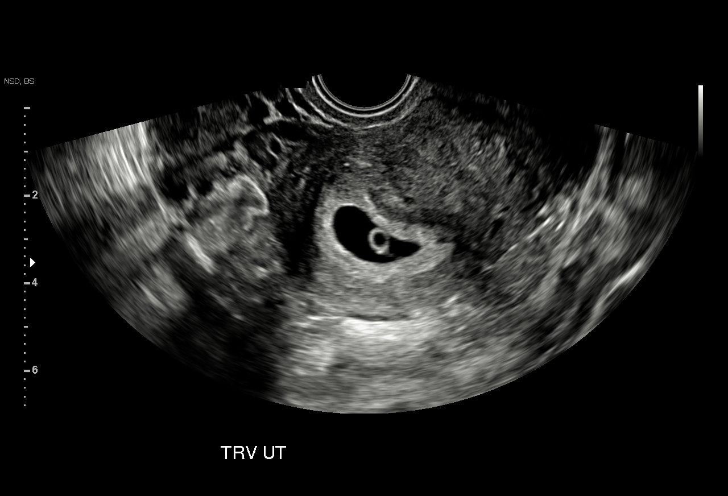
[im 30/36]
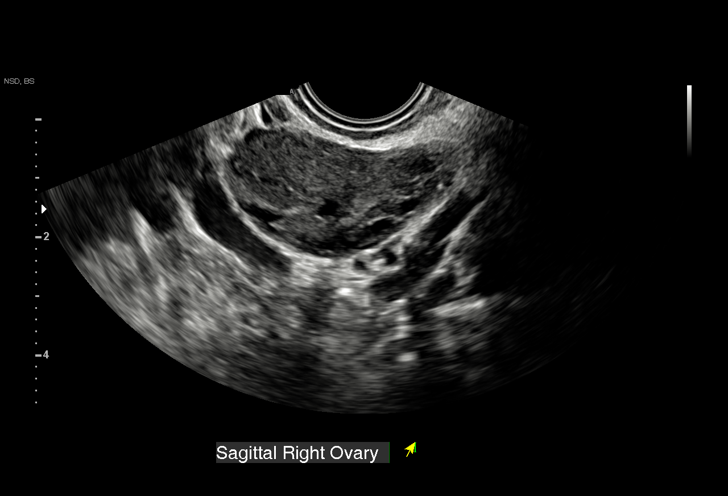
[im 33/36]
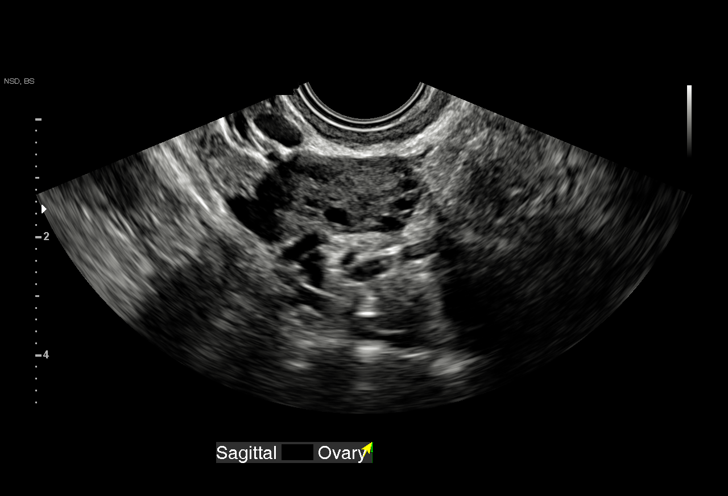
[im 36/36]
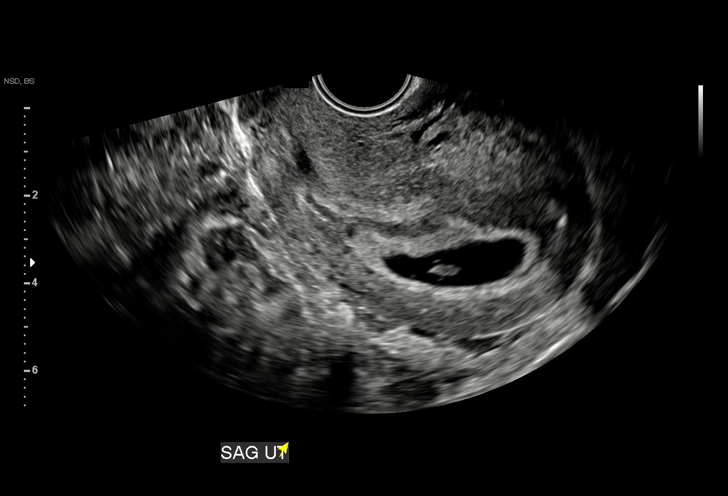

[15 of 28 positions shown; findings below may reference images not displayed]

FINDINGS: Intrauterine gestational sac: Single

Yolk sac:  Visualized.

Embryo:  Visualized.

Cardiac Activity: Visualized.

Heart Rate: 135  bpm

CRL:  12  mm   7 w   2 d                  US EDC: 05/26/2017

Subchorionic hemorrhage: Small subchorionic hemorrhage seen along
inferior aspect of gestational sac.

Maternal uterus/adnexae: Retroverted uterus. No fibroids identified.
Both ovaries are unremarkable in appearance. No mass or free fluid
identified.
IMPRESSION: Single living IUP measuring 7 weeks 2 days, with US EDC of
05/26/2017.

Small subchorionic hemorrhage noted.

## 2018-02-03 ENCOUNTER — Other Ambulatory Visit: Payer: Self-pay

## 2018-02-03 ENCOUNTER — Emergency Department (HOSPITAL_COMMUNITY)
Admission: EM | Admit: 2018-02-03 | Discharge: 2018-02-03 | Disposition: A | Discharge status: Home / Self Care | Payer: Medicaid Other | Attending: Emergency Medicine | Admitting: Emergency Medicine

## 2018-02-03 ENCOUNTER — Encounter (HOSPITAL_COMMUNITY): Payer: Self-pay

## 2018-02-03 DIAGNOSIS — E039 Hypothyroidism, unspecified: Secondary | ICD-10-CM | POA: Diagnosis not present

## 2018-02-03 DIAGNOSIS — H60501 Unspecified acute noninfective otitis externa, right ear: Secondary | ICD-10-CM | POA: Insufficient documentation

## 2018-02-03 DIAGNOSIS — H9201 Otalgia, right ear: Secondary | ICD-10-CM | POA: Diagnosis present

## 2018-02-03 MED ORDER — NEOMYCIN-POLYMYXIN-HC 3.5-10000-1 OT SUSP: 4.0000 [drp] | Freq: Three times a day (TID) | OTIC | 0 refills | Status: AC

## 2018-02-03 NOTE — ED Provider Notes (Signed)
[facility] EMERGENCY DEPARTMENT Provider Note   CSN: [id number] Arrival date & time: 02/03/18  1840     History   Chief Complaint Chief Complaint  Patient presents with  . Otalgia    HPI Erin Bell is a 27 y.o. female who presents to ED for evaluation of right ear pain, drainage, tenderness to palpation since yesterday.  She is been taking Tylenol with mild improvement in her symptoms.  States that she feels like her ear is "popping" when she coughs.  Reports some rhinorrhea that began today.  Denies any cough, fever, contacts with similar symptoms, or changes in hearing.  HPI  Past Medical History:  Diagnosis Date  . Chlamydia   . Depression    Previously treated with Risperdal  . Hypothyroidism     Patient Active Problem List   Diagnosis Date Noted  . Normal labor 05/17/2017  . History of cesarean delivery 05/17/2017  . Hereditary disease in family possibly affecting fetus, fetus 1   . Hypothyroid in pregnancy, antepartum, third trimester 03/18/2014    Past Surgical History:  Procedure Laterality Date  . CESAREAN SECTION N/A 01/15/2013   Procedure: CESAREAN SECTION;  Surgeon: Kathreen Cosier, MD;  Location: [facility];  Service: Obstetrics;  Laterality: N/A;  . NOSE SURGERY    . WISDOM TOOTH EXTRACTION       OB History    Gravida  2   Para  2   Term  2   Preterm  0   AB  0   Living  2     SAB  0   TAB  0   Ectopic  0   Multiple  0   Live Births  2        Obstetric Comments  Delivery note attached to prenatal record indicates 2014 low transverse c/s          Home Medications    Prior to Admission medications   Medication Sig Start Date End Date Taking? Authorizing Provider  levothyroxine (SYNTHROID, LEVOTHROID) 50 MCG tablet Take 50 mcg by mouth daily before breakfast.  12/23/16   [provider]  neomycin-polymyxin-hydrocortisone (CORTISPORIN) 3.5-10000-1 OTIC suspension Place 4 drops into the right ear 3  (three) times daily for 7 days. 02/03/18 02/10/18  Dietrich Pates, PA-C    Family History Family History  Problem Relation Age of Onset  . Arthritis Father   . Birth defects Sister        spina bifida  . Asthma Brother   . Diabetes Maternal Uncle   . Thyroid disease Mother   . Other Neg Hx   . Alcohol abuse Neg Hx   . Hypertension Neg Hx   . Hyperlipidemia Neg Hx   . Heart disease Neg Hx   . Hearing loss Neg Hx   . Early death Neg Hx   . Drug abuse Neg Hx   . Miscarriages / Stillbirths Neg Hx   . Mental retardation Neg Hx   . Mental illness Neg Hx   . Learning disabilities Neg Hx   . Kidney disease Neg Hx   . Stroke Neg Hx   . Vision loss Neg Hx     Social History Social History   Tobacco Use  . Smoking status: Former Smoker    Packs/day: 0.25    Types: Cigarettes  . Smokeless tobacco: Never Used  Substance Use Topics  . Alcohol use: No    Comment: occasional  . Drug use: No  Allergies   Patient has no known allergies.   Review of Systems Review of Systems  Constitutional: Negative for chills and fever.  HENT: Positive for ear discharge, ear pain and rhinorrhea. Negative for drooling and trouble swallowing.   Gastrointestinal: Negative for vomiting.     Physical Exam Updated Vital Signs BP 122/73 (BP Location: Right Arm)   Pulse 70   Temp 98.9 F (37.2 C) (Oral)   Resp 18   Ht  (1.575 m)   Wt 76.2 kg (168 lb)   LMP 12/09/2017   SpO2 100%   BMI 30.73 kg/m   Physical Exam  Constitutional: She appears well-developed and well-nourished. No distress.  HENT:  Head: Normocephalic and atraumatic.  Right Ear: There is tenderness. No mastoid tenderness. Tympanic membrane is scarred and erythematous. Tympanic membrane is not injected and not perforated. A middle ear effusion is present.  Left Ear: Tympanic membrane is erythematous.  Tenderness to palpation of auricle and tragus of the right ear.  No mastoid tenderness to palpation or erythema noted.   Eyes: Conjunctivae and EOM are normal. No scleral icterus.  Neck: Normal range of motion.  Pulmonary/Chest: Effort normal. No respiratory distress.  Neurological: She is alert.  Skin: No rash noted. She is not diaphoretic.  Psychiatric: She has a normal mood and affect.  Nursing note and vitals reviewed.    ED Treatments / Results  Labs (all labs ordered are listed, but only abnormal results are displayed) Labs Reviewed - No data to display  EKG None  Radiology No results found.  Procedures Procedures (including critical care time)  Medications Ordered in ED Medications - No data to display   Initial Impression / Assessment and Plan / ED Course  I have reviewed the triage vital signs and the nursing notes.  Pertinent labs & imaging results that were available during my care of the patient were reviewed by me and considered in my medical decision making (see chart for details).     Patient presents to ED for evaluation of right ear pain, discharge and tenderness to palpation since yesterday.  Physical exam there is evidence of acute otitis externa without TM perforation.  She is afebrile.  She is overall well-appearing.  Will treat with Cortisporin drops for 7 days.  Advised to return to ED for any severe worsening symptoms.  Portions of this note were generated with Scientist, clinical (histocompatibility and immunogenetics). Dictation errors may occur despite best attempts at proofreading.   Final Clinical Impressions(s) / ED Diagnoses   Final diagnoses:  Acute otitis externa of right ear, unspecified type    ED Discharge Orders        Ordered    neomycin-polymyxin-hydrocortisone (CORTISPORIN) 3.5-10000-1 OTIC suspension  3 times daily     02/03/18 2007       Dietrich Pates, Cordelia Poche 02/03/18 2009    Linwood Dibbles, MD 02/04/18 0005

## 2018-02-03 NOTE — ED Triage Notes (Signed)
Pt presents with 2 day h/o R ear pain, denies drainage, reports "it sounds like I'm under water" and reports every time she burps or cough, it pops.

## 2018-02-28 IMAGING — US US MFM FETAL NUCHAL TRANSLUCENCY
1 series · 14 of 28 positions shown · non-contrast
Comparison: none

[Series 1: us mfm fetal nuchal translucency · 65 acquisitions, 14 frames shown]
[im 3/65]
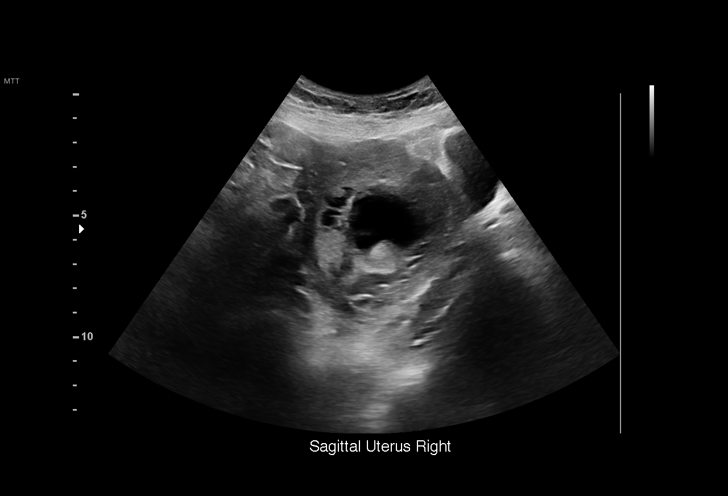
[im 8/65]
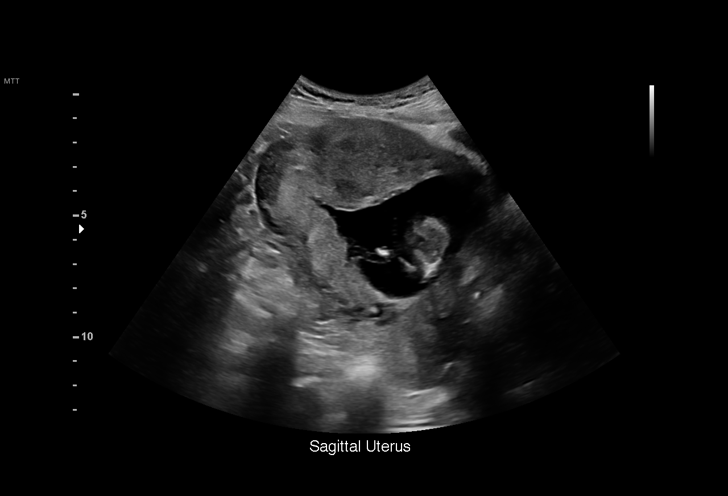
[im 12/65]
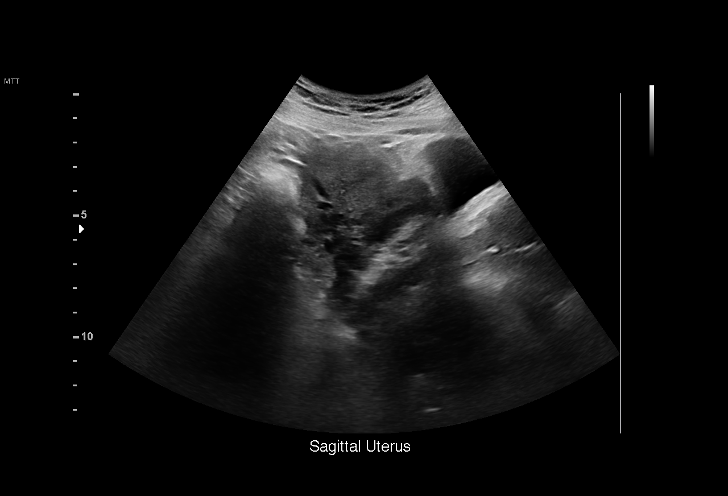
[im 17/65]
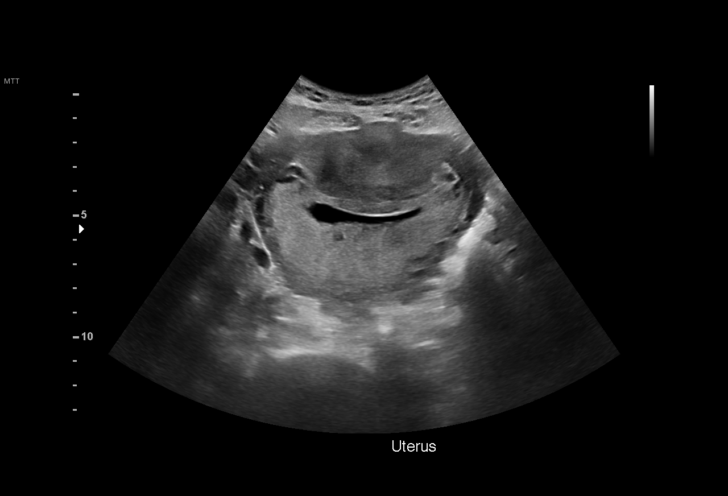
[im 22/65]
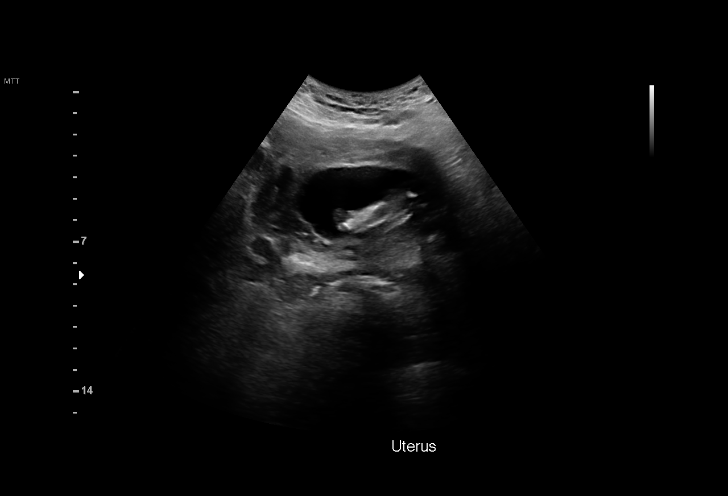
[im 27/65]
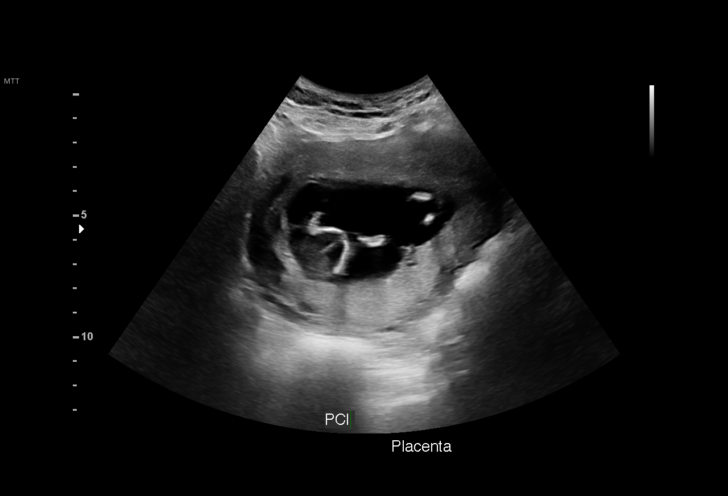
[im 31/65]
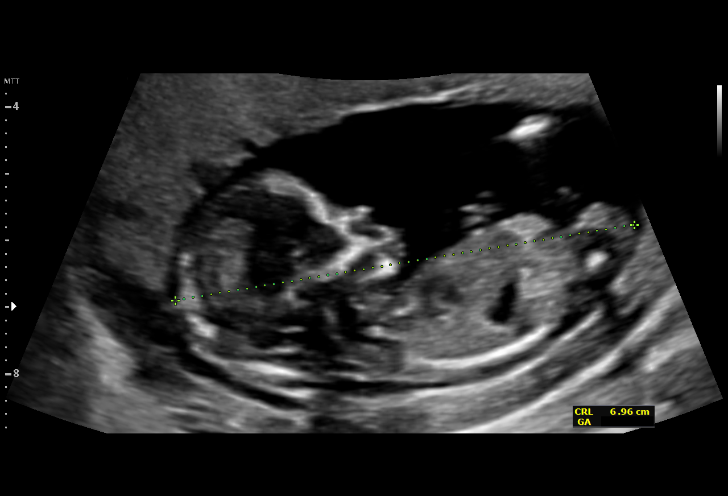
[im 36/65]
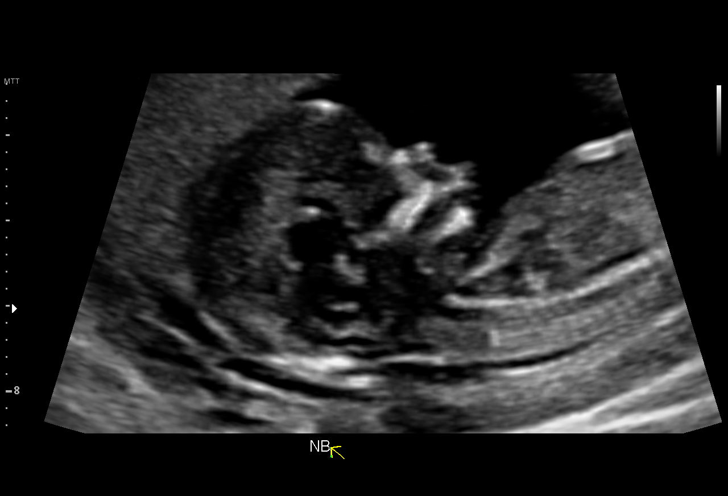
[im 41/65]
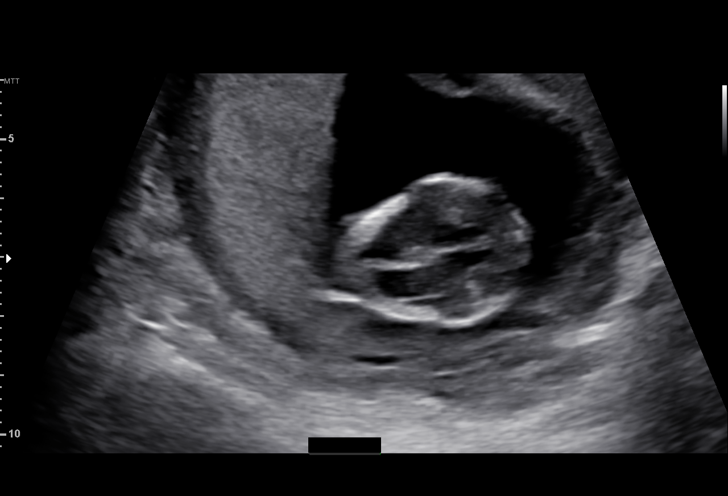
[im 46/65]
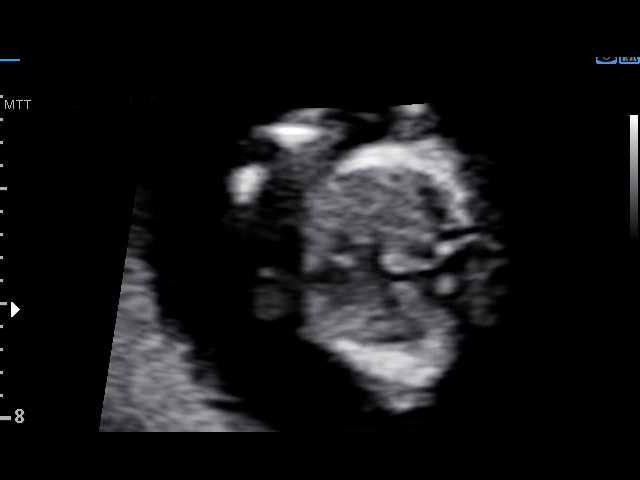
[im 50/65]
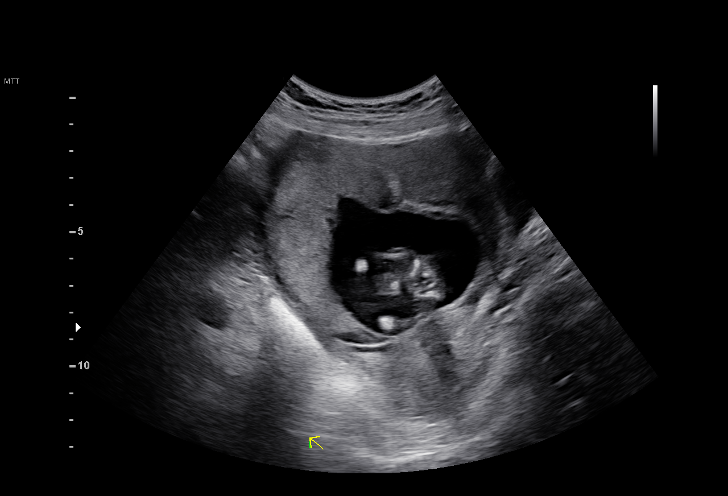
[im 55/65]
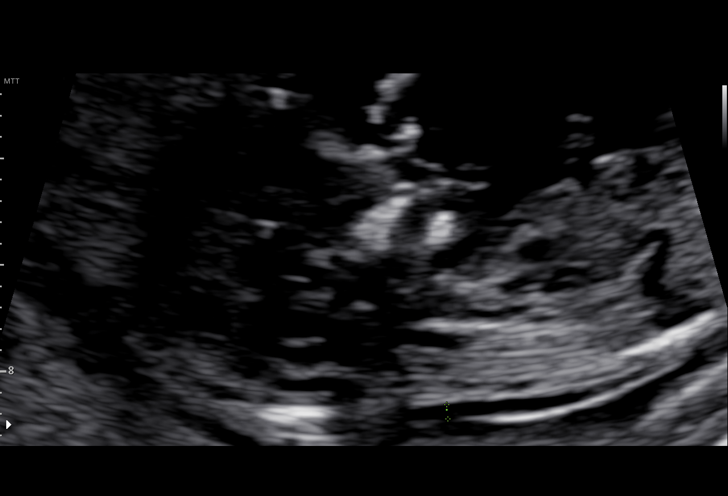
[im 60/65]
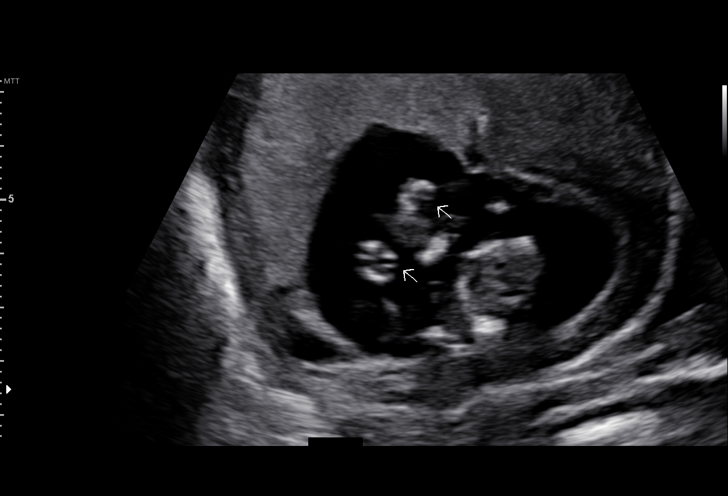
[im 65/65]
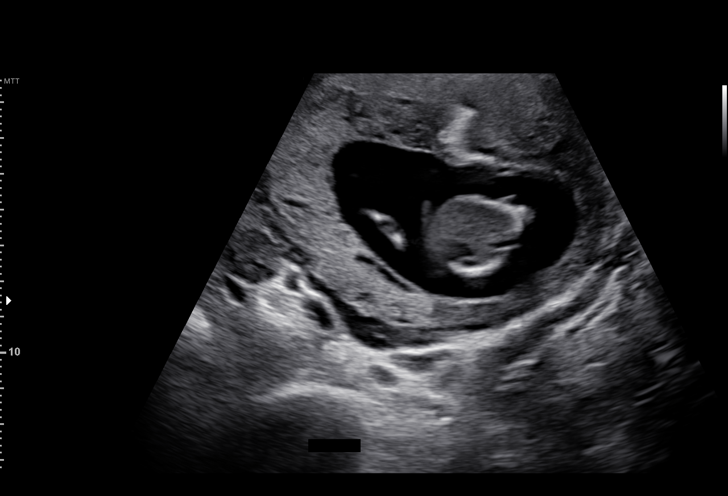

[14 of 28 positions shown; findings below may reference images not displayed]

[REDACTED]-
Faculty Physician
[facility]
Ref. Address:     [place]
[facility]

TRANSLUCENCY

1  KESIWAA                [id number]      [id number]     [id number]
[facility]
Indications

12 weeks gestation of pregnancy
Encounter for nuchal translucency
Thyroid disease in pregnancy (h/o              O99.280,
hyperthyroidism but now hypothyroid; on
Synthroid)
History of cesarean delivery, currently
pregnant
Family history of congenital anomaly (sister
with spina bifida)
OB History

Blood Type:            Height:  5'2"   Weight (lb):  144      BMI:
Gravidity:    2         Term:   1        Prem:   0        SAB:   0
TOP:          0       Ectopic:  0        Living: 1
Fetal Evaluation

Num Of Fetuses:     1
Fetal Heart         164
Rate(bpm):
Cardiac Activity:   Observed
Presentation:       Variable
Placenta:           Posterior
P. Cord Insertion:  Visualized, central
Amniotic Fluid
AFI FV:      Subjectively within normal limits
Gestational Age

LMP:           12w 4d       Date:   08/24/16                 EDD:   05/31/17
Best:          12w 4d    Det. By:   LMP  (08/24/16)          EDD:   05/31/17
1st Trimester Genetic Sonogram Screening

CRL:            69.9  mm    G. Age:   13w 0d                 EDD:   05/28/17
Nuc Trans:       1.3  mm
Nasal Bone:                 Present
Anatomy

Cranium:               Visualized             Bladder:                Visualized
Choroid Plexus:        Visualized             Upper Extremities:      Visualized
Heart:                 Visualized             Lower Extremities:      Visualized
Stomach:               Visualized

Other:  Technically difficult due to early gestational age.
Cervix Uterus Adnexa

Cervix
Normal appearance by transabdominal scan.

Uterus
No abnormality visualized.

Left Ovary
Size(cm)     2.46  x    2.28   x  1.84      Vol(ml):
Within normal limits. No adnexal mass visualized.

Right Ovary
Size(cm)     2.78  x    2.14   x  1.79      Vol(ml):
Within normal limits. No adnexal mass visualized.

Cul De Sac:   No free fluid seen.

Adnexa:       No abnormality visualized.
Impression

SIUP at 12+4 weeks
No gross abnormalities identified
NT measurement was within normal limits for this GA; NB
present
Normal amniotic fluid volume
Measurements consistent with LMP dating
Recommendations

Offer MSAFP in the second trimester for ONTD screening
Offer anatomy U/S by 18 weeks

## 2018-09-14 ENCOUNTER — Encounter: Payer: Self-pay | Admitting: Advanced Practice Midwife

## 2018-09-14 ENCOUNTER — Ambulatory Visit (INDEPENDENT_AMBULATORY_CARE_PROVIDER_SITE_OTHER): Payer: Self-pay | Admitting: Clinical

## 2018-09-14 ENCOUNTER — Other Ambulatory Visit (HOSPITAL_COMMUNITY)
Admission: RE | Admit: 2018-09-14 | Discharge: 2018-09-14 | Disposition: A | Discharge status: Home / Self Care | Payer: Medicaid Other | Source: Ambulatory Visit | Attending: Advanced Practice Midwife | Admitting: Advanced Practice Midwife

## 2018-09-14 ENCOUNTER — Ambulatory Visit (INDEPENDENT_AMBULATORY_CARE_PROVIDER_SITE_OTHER): Payer: Medicaid Other | Admitting: Advanced Practice Midwife

## 2018-09-14 VITALS — BP 125/84 | HR 80 | Wt 150.9 lb

## 2018-09-14 DIAGNOSIS — Z01419 Encounter for gynecological examination (general) (routine) without abnormal findings: Secondary | ICD-10-CM

## 2018-09-14 DIAGNOSIS — F39 Unspecified mood [affective] disorder: Secondary | ICD-10-CM

## 2018-09-14 DIAGNOSIS — Z0000 Encounter for general adult medical examination without abnormal findings: Secondary | ICD-10-CM | POA: Diagnosis not present

## 2018-09-14 DIAGNOSIS — E039 Hypothyroidism, unspecified: Secondary | ICD-10-CM

## 2018-09-14 DIAGNOSIS — N898 Other specified noninflammatory disorders of vagina: Secondary | ICD-10-CM

## 2018-09-14 NOTE — Progress Notes (Signed)
Pt's screening numbers are high. [facility]

## 2018-09-14 NOTE — Progress Notes (Signed)
GYNECOLOGY ANNUAL PREVENTATIVE CARE ENCOUNTER NOTE  Subjective:   Erin Bell is a 27 y.o. [phone] female here for a routine annual gynecologic exam.  Current complaints: increased vaginal discharge with concern from BV.  Reports having chronic issue with BV and usually receives treatment through the health department.    Denies abnormal vaginal bleeding, pelvic pain, problems with intercourse or other gynecologic concerns. Endorses irregular menses/spotting s/t Nexplanon.   Gynecologic History Patient's last menstrual period was 09/11/2018. Contraception: Nexplanon placed Aug. 2018 Last Pap: 06/24/2012. Results were: normal Last mammogram: N/A.   Obstetric History OB History  Gravida Para Term Preterm AB Living  2 2 2  0 0 2  SAB TAB Ectopic Multiple Live Births  0 0 0 0 2    # Outcome Date GA Lbr Len/2nd Weight Sex Delivery Anes PTL Lv  2 Term 05/17/17 [redacted]w[redacted]d 02:05 / 00:26 6 lb 1.7 oz (2.77 kg) M VBAC EPI  LIV     Birth Comments: WNL  1 Term 01/15/13 [redacted]w[redacted]d 22:30 / 00:51 6 lb 0.8 oz (2.745 kg) F CS-LVertical Spinal  LIV    Obstetric Comments  Delivery note attached to prenatal record indicates 2014 low transverse c/s     Past Medical History:  Diagnosis Date  . Chlamydia   . Depression    Previously treated with Risperdal  . Hypothyroidism     Past Surgical History:  Procedure Laterality Date  . CESAREAN SECTION N/A 01/15/2013   Procedure: CESAREAN SECTION;  Surgeon: Kathreen Cosier, MD;  Location: [facility];  Service: Obstetrics;  Laterality: N/A;  . NOSE SURGERY    . WISDOM TOOTH EXTRACTION      Current Outpatient Medications on File Prior to Visit  Medication Sig Dispense Refill  . levothyroxine (SYNTHROID, LEVOTHROID) 50 MCG tablet Take 50 mcg by mouth daily before breakfast.   0   No current facility-administered medications on file prior to visit.     No Known Allergies  Social History   Socioeconomic History  . Marital status: Single    Spouse name: Not  on file  . Number of children: Not on file  . Years of education: Not on file  . Highest education level: Not on file  Occupational History  . Not on file  Social Needs  . Financial resource strain: Not on file  . Food insecurity:    Worry: Never true    Inability: Never true  . Transportation needs:    Medical: No    Non-medical: No  Tobacco Use  . Smoking status: Former Smoker    Packs/day: 0.25    Types: Cigarettes  . Smokeless tobacco: Never Used  Substance and Sexual Activity  . Alcohol use: No    Comment: occasional  . Drug use: No  . Sexual activity: Yes    Birth control/protection: Other-see comments  Lifestyle  . Physical activity:    Days per week: 1 day    Minutes per session: 20 min  . Stress: Not on file  Relationships  . Social connections:    Talks on phone: Not on file    Gets together: Not on file    Attends religious service: Not on file    Active member of club or organization: Not on file    Attends meetings of clubs or organizations: Not on file    Relationship status: Not on file  . Intimate partner violence:    Fear of current or ex partner: No    Emotionally abused:  No    Physically abused: No    Forced sexual activity: No  Other Topics Concern  . Not on file  Social History Narrative  . Not on file    Family History  Problem Relation Age of Onset  . Arthritis Father   . Birth defects Sister        spina bifida  . Asthma Brother   . Diabetes Maternal Uncle   . Thyroid disease Mother   . Other Neg Hx   . Alcohol abuse Neg Hx   . Hypertension Neg Hx   . Hyperlipidemia Neg Hx   . Heart disease Neg Hx   . Hearing loss Neg Hx   . Early death Neg Hx   . Drug abuse Neg Hx   . Miscarriages / Stillbirths Neg Hx   . Mental retardation Neg Hx   . Mental illness Neg Hx   . Learning disabilities Neg Hx   . Kidney disease Neg Hx   . Stroke Neg Hx   . Vision loss Neg Hx     The following portions of the patient's history were reviewed  and updated as appropriate: allergies, current medications, past family history, past medical history, past social history, past surgical history and problem list.  Review of Systems Pertinent items noted in HPI and remainder of comprehensive ROS otherwise negative.   Objective:  BP 125/84   Pulse 80   Wt 150 lb 14.4 oz (68.4 kg)   LMP 09/11/2018   Breastfeeding No   BMI 27.60 kg/m  CONSTITUTIONAL: Well-developed, well-nourished female in no acute distress.  HENT:  Normocephalic, atraumatic, External right and left ear normal. Oropharynx is clear and moist EYES: Conjunctivae and EOM are normal. Pupils are equal, round, and reactive to light. No scleral icterus.  NECK: Normal range of motion, supple, no masses.  SKIN: Skin is warm and dry. No rash noted. Not diaphoretic. No erythema. No pallor. NEUROLOGIC: Alert and oriented to person, place, and time. Normal reflexes, muscle tone coordination. No cranial nerve deficit noted. PSYCHIATRIC: Normal mood and affect. Normal behavior. Normal judgment and thought content. CARDIOVASCULAR: Normal heart rate noted, regular rhythm RESPIRATORY: Clear to auscultation bilaterally. Effort and breath sounds normal, no problems with respiration noted. BREASTS: Symmetric in size. No masses, skin changes, nipple drainage, or lymphadenopathy. ABDOMEN: Soft, normal bowel sounds, no distention noted.  No tenderness, rebound or guarding.  PELVIC: Normal appearing external genitalia; Vaginal mucosa appears inflamed. Small amt bright red blood in vault c/w menses. Wet prep collected. Cervix: large, pink, no lesions, cysts, or polyps noted. Pap collected. Active bleeding from os. Normal uterine size, no other palpable masses, no uterine or adnexal tenderness. MUSCULOSKELETAL: Normal range of motion. No tenderness.  No cyanosis, clubbing, or edema.  2+ distal pulses.   Assessment and Plan:  1. Well woman exam -Discussed healthy lifestyle behaviors including daily  exercise, healthy diet, and smoking (marijuana) cessation.  -Encouraged initiation of monthly SBE. Educated on how to complete and when to report concerns.  -Exam findings discussed - Cytology - PAP( )  2. Hypothyroidism, unspecified type - Thyroid Panel With TSH -Discussed need for obtaining PCP for follow up and mgmt.  3. Vaginal Discharge -Wet prep pending.   Will follow up results and manage/refer accordingly.  Routine preventative health maintenance measures emphasized. Please refer to After Visit Summary for other counseling recommendations.   Cherre RobinsJessica L Chizuko Trine MSN, CNM 09/14/2018 4:47 PM

## 2018-09-14 NOTE — BH Specialist Note (Signed)
Integrated Behavioral Health Initial Visit  MRN: [id number] Name: Erin Bell  Number of Integrated Behavioral Health Clinician visits:: 1/6 Session Start time: 4:55  Session End time: 5:29 Total time: 30 minutes  Type of Service: Integrated Behavioral Health- Individual/Family Interpretor:No. Interpretor Name and Language: n/a   Warm Hand Off Completed.       SUBJECTIVE: Erin Bell is a 27 y.o. female accompanied by n/a Patient was referred by Thressa ShellerHeather Hogan, CNM for positive depression/anxiety/SI screen Patient reports the following symptoms/concerns: Pt denies SI, says she feels "overwhelmed sometimes", with a lot of anxiety, but does not have thoughts of harming herself. Pt says she was last treated for "ADHD and depression" as a child, was last treated over six years ago at the health department; pt's mother (on phone) says pt was on Lamictal and Risperdal in childhood only. Pt open to putting together a safety plan, and re-establishing care with psychiatry.  Duration of problem: Ongoing; Severity of problem: moderately severe  OBJECTIVE: Mood: Anxious and Depressed and Affect: Appropriate Risk of harm to self or others: No plan to harm self or others  LIFE CONTEXT: Family and Social: Pt lives with her boyfriend and two childen (1yo son; 5yo daughter) School/Work: Pt and boyfriend work Self-Care: Pt recognizing a greater need for self-care Life Changes: One year postpartum  GOALS ADDRESSED: Patient will: 1. Reduce symptoms of: anxiety, depression and stress 2. Increase knowledge and/or ability of: healthy habits and stress reduction  3. Demonstrate ability to: Increase healthy adjustment to current life circumstances and Increase motivation to adhere to plan of care  INTERVENTIONS: Interventions utilized: Motivational Interviewing and Psychoeducation and/or Health Education  Standardized Assessments completed: GAD-7 and PHQ 9  ASSESSMENT: Patient currently  experiencing Mood disorder, unspecified.   Patient may benefit from psychoeducation and brief therapeutic interventions regarding coping with symptoms of anxiety and depression, along with safety planning today. Marland Kitchen.  PLAN: 1. Follow up with behavioral health clinician on : As needed 2. Behavioral recommendations:  -Follow Safety Plan, if SI begins; share plan with mother, boyfriend, and best friend -Make a list of go-to songs, to listen to when feeling overwhelmed; consider using apps as additional coping strategy -[facility] within one week, to re-establish care for [facility] and ongoing therapy -Read educational materials regarding coping with symptoms of anxiety and depression.  3. Referral(s): [facility] (In Clinic) and [facility] ([facility]Outside [facility]) 4. "From scale of 1-10, how likely are you to follow plan?": -  Rae LipsJamie C Terreon Ekholm, LCSW  Depression screen [facility] 09/14/2018  Decreased Interest 2  Down, Depressed, Hopeless 3  PHQ - 2 Score 5  Altered sleeping 1  Tired, decreased energy 2  Change in appetite 0  Feeling bad or failure about yourself  3  Trouble concentrating 3  Moving slowly or fidgety/restless 0  Suicidal thoughts 2  PHQ-9 Score 16   GAD 7 : Generalized Anxiety Score 09/14/2018  Nervous, Anxious, on Edge 0  Control/stop worrying 3  Worry too much - different things 3  Trouble relaxing 2  Restless 0  Easily annoyed or irritable 3  Afraid - awful might happen 0  Total GAD 7 Score 11

## 2018-09-14 NOTE — Patient Instructions (Signed)
Primary care follow up  [facility] (will see you even if you do not have sickle cell): [phone] [facility]: [phone] [facility]: [phone] [facility] [phone]   

## 2018-09-15 LAB — CERVICOVAGINAL ANCILLARY ONLY
Bacterial vaginitis: POSITIVE — AB
Candida vaginitis: NEGATIVE
TRICH (WINDOWPATH): NEGATIVE

## 2018-09-15 LAB — THYROID PANEL WITH TSH
Free Thyroxine Index: 2 (ref 1.2–4.9)
T3 UPTAKE RATIO: 30 % (ref 24–39)
T4, Total: 6.7 ug/dL (ref 4.5–12.0)
TSH: 1.41 u[IU]/mL (ref 0.450–4.500)

## 2018-09-16 ENCOUNTER — Telehealth: Payer: Self-pay

## 2018-09-16 ENCOUNTER — Other Ambulatory Visit (HOSPITAL_COMMUNITY): Payer: Self-pay

## 2018-09-16 DIAGNOSIS — B9689 Other specified bacterial agents as the cause of diseases classified elsewhere: Secondary | ICD-10-CM

## 2018-09-16 DIAGNOSIS — N760 Acute vaginitis: Principal | ICD-10-CM

## 2018-09-16 LAB — CYTOLOGY - PAP
CHLAMYDIA, DNA PROBE: NEGATIVE
Diagnosis: NEGATIVE
Neisseria Gonorrhea: NEGATIVE

## 2018-09-16 MED ORDER — METRONIDAZOLE 500 MG PO TABS: 500.0000 mg | ORAL_TABLET | Freq: Two times a day (BID) | ORAL | 0 refills | Status: DC

## 2018-09-16 NOTE — Telephone Encounter (Signed)
Called pt to advise that she tested positive for BV & Rx  Flagyl was sent to her pharmacy. Pt verbalized understanding

## 2019-06-23 ENCOUNTER — Other Ambulatory Visit: Payer: Self-pay

## 2019-06-23 ENCOUNTER — Encounter (HOSPITAL_COMMUNITY): Payer: Self-pay

## 2019-06-23 ENCOUNTER — Other Ambulatory Visit: Payer: Self-pay | Admitting: Emergency Medicine

## 2019-06-23 ENCOUNTER — Emergency Department (HOSPITAL_COMMUNITY)
Admission: EM | Admit: 2019-06-23 | Discharge: 2019-06-23 | Disposition: A | Discharge status: Home / Self Care | Payer: Medicaid Other | Attending: Emergency Medicine | Admitting: Emergency Medicine

## 2019-06-23 DIAGNOSIS — E039 Hypothyroidism, unspecified: Secondary | ICD-10-CM | POA: Diagnosis not present

## 2019-06-23 DIAGNOSIS — Z79899 Other long term (current) drug therapy: Secondary | ICD-10-CM | POA: Insufficient documentation

## 2019-06-23 DIAGNOSIS — Z87891 Personal history of nicotine dependence: Secondary | ICD-10-CM | POA: Diagnosis not present

## 2019-06-23 DIAGNOSIS — N760 Acute vaginitis: Secondary | ICD-10-CM | POA: Diagnosis not present

## 2019-06-23 DIAGNOSIS — B9689 Other specified bacterial agents as the cause of diseases classified elsewhere: Secondary | ICD-10-CM

## 2019-06-23 DIAGNOSIS — Z202 Contact with and (suspected) exposure to infections with a predominantly sexual mode of transmission: Secondary | ICD-10-CM | POA: Diagnosis present

## 2019-06-23 LAB — WET PREP, GENITAL
Sperm: NONE SEEN
Trich, Wet Prep: NONE SEEN
Yeast Wet Prep HPF POC: NONE SEEN

## 2019-06-23 MED ORDER — METRONIDAZOLE 0.75 % VA GEL: 1.0000 | Freq: Every day | VAGINAL | 0 refills | Status: DC

## 2019-06-23 NOTE — ED Triage Notes (Signed)
Pt reports she has had multiple sex partners back to back. Pt reports someone put on social media she has a STD and she wants to be checked. Pt denies any abdominal pain. Pt reports Weichert discharge that sometimes itches. Pt denies fever, N/V/D.

## 2019-06-23 NOTE — Discharge Instructions (Addendum)
Please read attached information. If you experience any new or worsening signs or symptoms please return to the emergency room for evaluation. Please follow-up with your primary care provider or specialist as discussed. Please use medication prescribed only as directed and discontinue taking if you have any concerning signs or symptoms.   °

## 2019-06-23 NOTE — ED Provider Notes (Signed)
[facility] EMERGENCY DEPARTMENT Provider Note   CSN: [id number] Arrival date & time: 06/23/19  1204     History   Chief Complaint Chief Complaint  Patient presents with  . Exposure to STD    HPI Erin Bell is a 28 y.o. female.     HPI   28 year old female presents today for STD testing.  Patient notes she is sexually active and has had 62 female partners within the last month.  She was told on Facebook that she probably has herpes.  She denies any lesions, she notes the female partner she had intercourse with denies any lesions.  She notes minor [place] discharge from the vagina.  She denies any abdominal/pelvic pain or fever.  She has previously had bacterial vaginosis and uncertain if this feels similar.     Past Medical History:  Diagnosis Date  . Chlamydia   . Depression    Previously treated with Risperdal  . Hypothyroidism     Patient Active Problem List   Diagnosis Date Noted  . Normal labor 05/17/2017  . History of cesarean delivery 05/17/2017  . Hereditary disease in family possibly affecting fetus, fetus 62   . Hypothyroid in pregnancy, antepartum, third trimester 03/18/2014    Past Surgical History:  Procedure Laterality Date  . CESAREAN SECTION N/A 01/15/2013   Procedure: CESAREAN SECTION;  Surgeon: Frederico Hamman, MD;  Location: [facility];  Service: Obstetrics;  Laterality: N/A;  . NOSE SURGERY    . WISDOM TOOTH EXTRACTION       OB History    Gravida  2   Para  2   Term  2   Preterm  0   AB  0   Living  2     SAB  0   TAB  0   Ectopic  0   Multiple  0   Live Births  2        Obstetric Comments  Delivery note attached to prenatal record indicates 2014 low transverse c/s          Home Medications    Prior to Admission medications   Medication Sig Start Date End Date Taking? Authorizing Provider  levothyroxine (SYNTHROID, LEVOTHROID) 50 MCG tablet Take 50 mcg by mouth daily before breakfast.  12/23/16    [provider]  metroNIDAZOLE (FLAGYL) 500 MG tablet Take 1 tablet (500 mg total) by mouth 2 (two) times daily. 09/16/18   Gavin Pound, CNM  metroNIDAZOLE (METROGEL) 0.75 % vaginal gel Place 1 Applicatorful vaginally daily. Please insert 5 g gel into the vaginal vault 1 time daily at bedtime for 5 days 06/23/19   Okey Regal, PA-C    Family History Family History  Problem Relation Age of Onset  . Arthritis Father   . Birth defects Sister        spina bifida  . Asthma Brother   . Diabetes Maternal Uncle   . Thyroid disease Mother   . Other Neg Hx   . Alcohol abuse Neg Hx   . Hypertension Neg Hx   . Hyperlipidemia Neg Hx   . Heart disease Neg Hx   . Hearing loss Neg Hx   . Early death Neg Hx   . Drug abuse Neg Hx   . Miscarriages / Stillbirths Neg Hx   . Mental retardation Neg Hx   . Mental illness Neg Hx   . Learning disabilities Neg Hx   . Kidney disease Neg Hx   .  Stroke Neg Hx   . Vision loss Neg Hx     Social History Social History   Tobacco Use  . Smoking status: Former Smoker    Packs/day: 0.25    Types: Cigarettes  . Smokeless tobacco: Never Used  Substance Use Topics  . Alcohol use: No    Comment: occasional  . Drug use: No     Allergies   Patient has no known allergies.   Review of Systems Review of Systems  All other systems reviewed and are negative.    Physical Exam Updated Vital Signs BP 121/79   Pulse 68   Temp 98.4 F (36.9 C)   Resp 16   SpO2 98%   Physical Exam Vitals signs and nursing note reviewed.  Constitutional:      Appearance: She is well-developed.  HENT:     Head: Normocephalic and atraumatic.  Eyes:     General: No scleral icterus.       Right eye: No discharge.        Left eye: No discharge.     Conjunctiva/sclera: Conjunctivae normal.     Pupils: Pupils are equal, round, and reactive to light.  Neck:     Musculoskeletal: Normal range of motion.     Vascular: No JVD.     Trachea: No tracheal  deviation.  Pulmonary:     Effort: Pulmonary effort is normal.     Breath sounds: No stridor.  Abdominal:     General: There is no distension.     Palpations: Abdomen is soft.     Tenderness: There is no abdominal tenderness.  Genitourinary:    Comments: Thin [place] discharge in the vaginal vault, no cervical motion tenderness Neurological:     Mental Status: She is alert and oriented to person, place, and time.     Coordination: Coordination normal.  Psychiatric:        Behavior: Behavior normal.        Thought Content: Thought content normal.        Judgment: Judgment normal.      ED Treatments / Results  Labs (all labs ordered are listed, but only abnormal results are displayed) Labs Reviewed  WET PREP, GENITAL - Abnormal; Notable for the following components:      Result Value   Clue Cells Wet Prep HPF POC PRESENT (*)    WBC, Wet Prep HPF POC MANY (*)    All other components within normal limits  RPR  HIV ANTIBODY (ROUTINE TESTING W REFLEX)  GC/CHLAMYDIA PROBE AMP ([facility]) NOT AT [facility]    EKG None  Radiology No results found.  Procedures Procedures (including critical care time)  Medications Ordered in ED Medications - No data to display   Initial Impression / Assessment and Plan / ED Course  I have reviewed the triage vital signs and the nursing notes.  Pertinent labs & imaging results that were available during my care of the patient were reviewed by me and considered in my medical decision making (see chart for details).        28 year old female presents today for STD testing.  Patient has clue cells indicative of bacterial vaginosis, no purulence.  Discussed treatment options patient would like metronidazole gel, GC pending.  Discharged with return precautions.  Final Clinical Impressions(s) / ED Diagnoses   Final diagnoses:  Bacterial vaginosis    ED Discharge Orders         Ordered    metroNIDAZOLE (METROGEL) 0.75 % vaginal  gel  Daily      06/23/19 1507           Rosalio Loud 06/23/19 1509    Gerhard Munch, MD 06/24/19 [phone]

## 2019-06-24 LAB — RPR: RPR Ser Ql: NONREACTIVE

## 2019-06-24 LAB — HIV ANTIBODY (ROUTINE TESTING W REFLEX): HIV Screen 4th Generation wRfx: NONREACTIVE

## 2019-06-24 LAB — CERVICOVAGINAL ANCILLARY ONLY
Chlamydia: NEGATIVE
Neisseria Gonorrhea: NEGATIVE

## 2019-06-28 ENCOUNTER — Emergency Department (HOSPITAL_COMMUNITY): Payer: Medicaid Other

## 2019-06-28 ENCOUNTER — Other Ambulatory Visit: Payer: Self-pay

## 2019-06-28 ENCOUNTER — Emergency Department (HOSPITAL_COMMUNITY)
Admission: EM | Admit: 2019-06-28 | Discharge: 2019-06-28 | Disposition: A | Discharge status: Home / Self Care | Payer: Medicaid Other | Attending: Emergency Medicine | Admitting: Emergency Medicine

## 2019-06-28 DIAGNOSIS — E039 Hypothyroidism, unspecified: Secondary | ICD-10-CM | POA: Insufficient documentation

## 2019-06-28 DIAGNOSIS — S01511A Laceration without foreign body of lip, initial encounter: Secondary | ICD-10-CM | POA: Diagnosis not present

## 2019-06-28 DIAGNOSIS — W010XXA Fall on same level from slipping, tripping and stumbling without subsequent striking against object, initial encounter: Secondary | ICD-10-CM | POA: Insufficient documentation

## 2019-06-28 DIAGNOSIS — Y939 Activity, unspecified: Secondary | ICD-10-CM | POA: Insufficient documentation

## 2019-06-28 DIAGNOSIS — Z23 Encounter for immunization: Secondary | ICD-10-CM | POA: Insufficient documentation

## 2019-06-28 DIAGNOSIS — Z87891 Personal history of nicotine dependence: Secondary | ICD-10-CM | POA: Diagnosis not present

## 2019-06-28 DIAGNOSIS — Z79899 Other long term (current) drug therapy: Secondary | ICD-10-CM | POA: Diagnosis not present

## 2019-06-28 DIAGNOSIS — Y929 Unspecified place or not applicable: Secondary | ICD-10-CM | POA: Insufficient documentation

## 2019-06-28 DIAGNOSIS — Y999 Unspecified external cause status: Secondary | ICD-10-CM | POA: Insufficient documentation

## 2019-06-28 MED ORDER — LIDOCAINE-EPINEPHRINE (PF) 2 %-1:200000 IJ SOLN
20.0000 mL | Freq: Once | INTRAMUSCULAR | Status: AC
  Administered 2019-06-28: 20 mL
  Filled 2019-06-28: qty 20

## 2019-06-28 MED ORDER — TETANUS-DIPHTH-ACELL PERTUSSIS 5-2.5-18.5 LF-MCG/0.5 IM SUSP
0.5000 mL | Freq: Once | INTRAMUSCULAR | Status: AC
  Administered 2019-06-28: 0.5 mL via INTRAMUSCULAR
  Filled 2019-06-28: qty 0.5

## 2019-06-28 NOTE — Discharge Instructions (Addendum)
Please read attached information. If you experience any new or worsening signs or symptoms please return to the emergency room for evaluation. Please follow-up with your primary care provider or specialist as discussed.  °

## 2019-06-28 NOTE — ED Provider Notes (Signed)
[facility] Provider Note   CSN: [id number] Arrival date & time: 06/28/19  1400     History   Chief Complaint Chief Complaint  Patient presents with  . Lip Laceration    HPI Erin Bell is a 28 y.o. female.     HPI   28 year old female presents today with complaints of lip laceration.  She notes she tripped fell and hit her face on an unknown object.  She notes a small laceration to the left upper lip.  She denies any dental pain decreased range of motion the jaw or any pain to the remainder the face.  She is uncertain when her last tetanus was but believes it was when she was pregnant in 2018.  She denies any headache or neck pain no loss of consciousness no neurological deficits.  She also notes pain to her right middle finger flexion range of motion at all joints no overlying wounds no swelling or edema sensation intact  Past Medical History:  Diagnosis Date  . Chlamydia   . Depression    Previously treated with Risperdal  . Hypothyroidism     Patient Active Problem List   Diagnosis Date Noted  . Normal labor 05/17/2017  . History of cesarean delivery 05/17/2017  . Hereditary disease in family possibly affecting fetus, fetus 60   . Hypothyroid in pregnancy, antepartum, third trimester 03/18/2014    Past Surgical History:  Procedure Laterality Date  . CESAREAN SECTION N/A 01/15/2013   Procedure: CESAREAN SECTION;  Surgeon: Frederico Hamman, MD;  Location: [facility];  Service: Obstetrics;  Laterality: N/A;  . NOSE SURGERY    . WISDOM TOOTH EXTRACTION       OB History    Gravida  2   Para  2   Term  2   Preterm  0   AB  0   Living  2     SAB  0   TAB  0   Ectopic  0   Multiple  0   Live Births  2        Obstetric Comments  Delivery note attached to prenatal record indicates 2014 low transverse c/s          Home Medications    Prior to Admission medications   Medication Sig Start Date End Date  Taking? Authorizing Provider  levothyroxine (SYNTHROID, LEVOTHROID) 50 MCG tablet Take 50 mcg by mouth daily before breakfast.  12/23/16   [provider]  metroNIDAZOLE (FLAGYL) 500 MG tablet Take 1 tablet (500 mg total) by mouth 2 (two) times daily. 09/16/18   Gavin Pound, CNM  metroNIDAZOLE (METROGEL) 0.75 % vaginal gel Place 1 Applicatorful vaginally daily. Please insert 5 g gel into the vaginal vault 1 time daily at bedtime for 5 days 06/23/19   Okey Regal, PA-C    Family History Family History  Problem Relation Age of Onset  . Arthritis Father   . Birth defects Sister        spina bifida  . Asthma Brother   . Diabetes Maternal Uncle   . Thyroid disease Mother   . Other Neg Hx   . Alcohol abuse Neg Hx   . Hypertension Neg Hx   . Hyperlipidemia Neg Hx   . Heart disease Neg Hx   . Hearing loss Neg Hx   . Early death Neg Hx   . Drug abuse Neg Hx   . Miscarriages / Stillbirths Neg Hx   .  Mental retardation Neg Hx   . Mental illness Neg Hx   . Learning disabilities Neg Hx   . Kidney disease Neg Hx   . Stroke Neg Hx   . Vision loss Neg Hx     Social History Social History   Tobacco Use  . Smoking status: Former Smoker    Packs/day: 0.25    Types: Cigarettes  . Smokeless tobacco: Never Used  Substance Use Topics  . Alcohol use: No    Comment: occasional  . Drug use: No     Allergies   Patient has no known allergies.   Review of Systems Review of Systems  All other systems reviewed and are negative.    Physical Exam Updated Vital Signs BP 107/80 (BP Location: Right Arm)   Pulse (!) 111   Temp 97.8 F (36.6 C) (Oral)   Resp 14   SpO2 100%   Physical Exam Vitals signs and nursing note reviewed.  Constitutional:      Appearance: She is well-developed.  HENT:     Head: Normocephalic and atraumatic.     Comments: 1 cm lip laceration to left upper lateral lip at the vermilion border - no loose dentition - no bony tenderness Eyes:      General: No scleral icterus.       Right eye: No discharge.        Left eye: No discharge.     Conjunctiva/sclera: Conjunctivae normal.     Pupils: Pupils are equal, round, and reactive to light.  Neck:     Musculoskeletal: Normal range of motion.     Vascular: No JVD.     Trachea: No tracheal deviation.  Pulmonary:     Effort: Pulmonary effort is normal.     Breath sounds: No stridor.  Musculoskeletal:     Comments: No cervical spine tenderness palpation  Neurological:     Mental Status: She is alert and oriented to person, place, and time.     Coordination: Coordination normal.  Psychiatric:        Behavior: Behavior normal.        Thought Content: Thought content normal.        Judgment: Judgment normal.      ED Treatments / Results  Labs (all labs ordered are listed, but only abnormal results are displayed) Labs Reviewed - No data to display  EKG None  Radiology Dg Finger Middle Right  Result Date: 06/28/2019 CLINICAL DATA:  Middle finger pain. EXAM: RIGHT MIDDLE FINGER 2+V COMPARISON:  None. FINDINGS: There is no evidence of fracture or dislocation. There is no evidence of arthropathy or other focal bone abnormality. Soft tissues are unremarkable. IMPRESSION: No acute osseous injury of the right third digit. Electronically Signed   By: Elige Ko   On: 06/28/2019 15:00    Procedures .Marland KitchenLaceration Repair  Date/Time: 06/28/2019 5:07 PM Performed by: Eyvonne Mechanic, PA-C Authorized by: Eyvonne Mechanic, PA-C   Consent:    Consent obtained:  Verbal   Consent given by:  Patient   Risks discussed:  Infection, pain, need for additional repair, nerve damage, poor cosmetic result, poor wound healing, vascular damage and retained foreign body   Alternatives discussed:  No treatment, delayed treatment and observation Anesthesia (see MAR for exact dosages):    Anesthesia method:  Local infiltration   Local anesthetic:  Lidocaine 2% WITH epi Laceration details:     Location: upper left lip.   Length (cm):  1 Repair type:    Repair  type:  Simple Pre-procedure details:    Preparation:  Patient was prepped and draped in usual sterile fashion Exploration:    Hemostasis achieved with:  Direct pressure   Wound exploration: wound explored through full range of motion and entire depth of wound probed and visualized     Wound extent: no fascia violation noted, no foreign bodies/material noted, no muscle damage noted, no nerve damage noted and no vascular damage noted     Contaminated: no   Treatment:    Area cleansed with:  Saline   Irrigation solution:  Sterile saline Skin repair:    Repair method:  Sutures   Suture size:  5-0   Suture material:  Fast-absorbing gut   Suture technique:  Simple interrupted   Number of sutures:  4 Approximation:    Approximation:  Close Post-procedure details:    Dressing:  Open (no dressing)   Patient tolerance of procedure:  Tolerated well, no immediate complications   (including critical care time)  Medications Ordered in ED Medications  lidocaine-EPINEPHrine (XYLOCAINE W/EPI) 2 %-1:200000 (PF) injection 20 mL (20 mLs Infiltration Given 06/28/19 1558)  Tdap (BOOSTRIX) injection 0.5 mL (0.5 mLs Intramuscular Given 06/28/19 1515)     Initial Impression / Assessment and Plan / ED Course  I have reviewed the triage vital signs and the nursing notes.  Pertinent labs & imaging results that were available during my care of the patient were reviewed by me and considered in my medical decision making (see chart for details).        Patient with uncomplicated laceration repaired here.  Discharged with symptomatic care return precautions.  No indication for antibiotics at this time.  Tetanus updated.  Final Clinical Impressions(s) / ED Diagnoses   Final diagnoses:  Lip laceration, initial encounter    ED Discharge Orders    None       Rosalio LoudHedges, Areatha Kalata, PA-C 06/28/19 1709    Benjiman CorePickering, Nathan, MD 06/29/19  1040

## 2019-06-28 NOTE — ED Notes (Signed)
Patient verbalizes understanding of discharge instructions. Opportunity for questioning and answers were provided. Armband removed by staff, pt discharged from ED.  

## 2019-06-28 NOTE — ED Triage Notes (Signed)
Pt here for evaluation of lip lac and finger injury from fall earlier today. No LOC.

## 2019-09-21 ENCOUNTER — Ambulatory Visit: Payer: Medicaid Other | Attending: Internal Medicine

## 2019-09-21 DIAGNOSIS — Z20822 Contact with and (suspected) exposure to covid-19: Secondary | ICD-10-CM

## 2019-09-22 LAB — NOVEL CORONAVIRUS, NAA: SARS-CoV-2, NAA: NOT DETECTED

## 2020-04-04 ENCOUNTER — Ambulatory Visit (HOSPITAL_COMMUNITY)
Admission: EM | Admit: 2020-04-04 | Discharge: 2020-04-04 | Disposition: A | Discharge status: Home / Self Care | Payer: Medicaid Other | Attending: Family Medicine | Admitting: Family Medicine

## 2020-04-04 ENCOUNTER — Other Ambulatory Visit: Payer: Self-pay

## 2020-04-04 ENCOUNTER — Encounter (HOSPITAL_COMMUNITY): Payer: Self-pay | Admitting: Emergency Medicine

## 2020-04-04 DIAGNOSIS — Z202 Contact with and (suspected) exposure to infections with a predominantly sexual mode of transmission: Secondary | ICD-10-CM | POA: Diagnosis present

## 2020-04-04 LAB — HIV ANTIBODY (ROUTINE TESTING W REFLEX): HIV Screen 4th Generation wRfx: NONREACTIVE

## 2020-04-04 NOTE — Discharge Instructions (Addendum)
Check my Chart for test results You will be called if anything is positive

## 2020-04-04 NOTE — ED Triage Notes (Signed)
Vaginal discharge that started today.   Wants STD check.
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
## 2020-04-05 LAB — CERVICOVAGINAL ANCILLARY ONLY
Chlamydia: POSITIVE — AB
Comment: NEGATIVE
Comment: NEGATIVE
Comment: NORMAL
Neisseria Gonorrhea: NEGATIVE
Trichomonas: NEGATIVE

## 2020-04-05 LAB — RPR: RPR Ser Ql: NONREACTIVE

## 2020-04-06 ENCOUNTER — Telehealth (HOSPITAL_COMMUNITY): Payer: Self-pay

## 2020-04-06 DIAGNOSIS — A749 Chlamydial infection, unspecified: Secondary | ICD-10-CM

## 2020-04-06 MED ORDER — AZITHROMYCIN 250 MG PO TABS: ORAL_TABLET | ORAL | 0 refills | Status: DC

## 2020-10-06 ENCOUNTER — Other Ambulatory Visit: Payer: Self-pay

## 2020-10-06 ENCOUNTER — Encounter (HOSPITAL_COMMUNITY): Payer: Self-pay

## 2020-10-06 ENCOUNTER — Ambulatory Visit (HOSPITAL_COMMUNITY)
Admission: EM | Admit: 2020-10-06 | Discharge: 2020-10-06 | Disposition: A | Discharge status: Home / Self Care | Payer: Medicaid Other | Attending: Physician Assistant | Admitting: Physician Assistant

## 2020-10-06 DIAGNOSIS — N898 Other specified noninflammatory disorders of vagina: Secondary | ICD-10-CM | POA: Diagnosis present

## 2020-10-06 NOTE — ED Triage Notes (Addendum)
Pt in with c/o vaginal discharge that has been going on for 1 week now. Pt states that she has also been having pain above her incision site from when she had c- section. States it's painful if she pushes down on it or bumps it.  Requesting STD testing  Denies any urinary sxs

## 2020-10-06 NOTE — Discharge Instructions (Addendum)
Test is pending.   

## 2020-10-06 NOTE — ED Provider Notes (Signed)
[facility]    CSN: [id number] Arrival date & time: 10/06/20  1779      History   Chief Complaint Chief Complaint  Patient presents with  . std testing  . Vaginal Discharge    HPI Erin Bell is a 30 y.o. female.   The history is provided by the patient. No language interpreter was used.  Vaginal Discharge Quality:  [place] Severity:  Moderate Onset quality:  Gradual Timing:  Constant Progression:  Worsening Chronicity:  New Relieved by:  Nothing Ineffective treatments:  None tried Pt complains of a [place] vaginal discharge.  Pt concerned about BV or yeast and request test.   Past Medical History:  Diagnosis Date  . Chlamydia   . Depression    Previously treated with Risperdal  . Hypothyroidism     Patient Active Problem List   Diagnosis Date Noted  . Normal labor 05/17/2017  . History of cesarean delivery 05/17/2017  . Hereditary disease in family possibly affecting fetus, fetus 1   . Hypothyroid in pregnancy, antepartum, third trimester 03/18/2014    Past Surgical History:  Procedure Laterality Date  . CESAREAN SECTION N/A 01/15/2013   Procedure: CESAREAN SECTION;  Surgeon: Kathreen Cosier, MD;  Location: [facility];  Service: Obstetrics;  Laterality: N/A;  . NOSE SURGERY    . WISDOM TOOTH EXTRACTION      OB History    Gravida  2   Para  2   Term  2   Preterm  0   AB  0   Living  2     SAB  0   IAB  0   Ectopic  0   Multiple  0   Live Births  2        Obstetric Comments  Delivery note attached to prenatal record indicates 2014 low transverse c/s          Home Medications    Prior to Admission medications   Medication Sig Start Date End Date Taking? Authorizing Provider  azithromycin (ZITHROMAX) 250 MG tablet Take all 4 tablets together 04/06/20   Lamptey, Britta Mccreedy, MD  levothyroxine (SYNTHROID, LEVOTHROID) 50 MCG tablet Take 50 mcg by mouth daily before breakfast.  12/23/16   [provider]  metroNIDAZOLE  (FLAGYL) 500 MG tablet Take 1 tablet (500 mg total) by mouth 2 (two) times daily. 09/16/18   Gerrit Heck, CNM  metroNIDAZOLE (METROGEL) 0.75 % vaginal gel Place 1 Applicatorful vaginally daily. Please insert 5 g gel into the vaginal vault 1 time daily at bedtime for 5 days 06/23/19   Eyvonne Mechanic, PA-C    Family History Family History  Problem Relation Age of Onset  . Arthritis Father   . Birth defects Sister        spina bifida  . Asthma Brother   . Diabetes Maternal Uncle   . Thyroid disease Mother   . Other Neg Hx   . Alcohol abuse Neg Hx   . Hypertension Neg Hx   . Hyperlipidemia Neg Hx   . Heart disease Neg Hx   . Hearing loss Neg Hx   . Early death Neg Hx   . Drug abuse Neg Hx   . Miscarriages / Stillbirths Neg Hx   . Mental retardation Neg Hx   . Mental illness Neg Hx   . Learning disabilities Neg Hx   . Kidney disease Neg Hx   . Stroke Neg Hx   . Vision loss Neg Hx  Social History Social History   Tobacco Use  . Smoking status: Former Smoker    Packs/day: 0.25    Types: Cigarettes  . Smokeless tobacco: Never Used  Vaping Use  . Vaping Use: Never used  Substance Use Topics  . Alcohol use: No    Comment: occasional  . Drug use: No     Allergies   Patient has no known allergies.   Review of Systems Review of Systems  Genitourinary: Positive for vaginal discharge.  All other systems reviewed and are negative.    Physical Exam Triage Vital Signs ED Triage Vitals  Enc Vitals Group     BP 10/06/20 0952 130/85     Pulse Rate 10/06/20 0952 66     Resp 10/06/20 0952 19     Temp 10/06/20 0952 98.3 F (36.8 C)     Temp src --      SpO2 10/06/20 0952 98 %     Weight --      Height --      Head Circumference --      Peak Flow --      Pain Score 10/06/20 0949 0     Pain Loc --      Pain Edu? --      Excl. in [place]? --    No data found.  Updated Vital Signs BP 130/85   Pulse 66   Temp 98.3 F (36.8 C)   Resp 19   SpO2 98%   Visual  Acuity Right Eye Distance:   Left Eye Distance:   Bilateral Distance:    Right Eye Near:   Left Eye Near:    Bilateral Near:     Physical Exam Vitals and nursing note reviewed.  Constitutional:      Appearance: She is well-developed and well-nourished.  HENT:     Head: Normocephalic.  Eyes:     Extraocular Movements: EOM normal.  Pulmonary:     Effort: Pulmonary effort is normal.  Abdominal:     General: There is no distension.  Musculoskeletal:        General: Normal range of motion.     Cervical back: Normal range of motion.  Skin:    General: Skin is warm.  Neurological:     General: No focal deficit present.     Mental Status: She is alert and oriented to person, place, and time.  Psychiatric:        Mood and Affect: Mood and affect and mood normal.      UC Treatments / Results  Labs (all labs ordered are listed, but only abnormal results are displayed) Labs Reviewed  CERVICOVAGINAL ANCILLARY ONLY    EKG   Radiology No results found.  Procedures Procedures (including critical care time)  Medications Ordered in UC Medications - No data to display  Initial Impression / Assessment and Plan / UC Course  I have reviewed the triage vital signs and the nursing notes.  Pertinent labs & imaging results that were available during my care of the patient were reviewed by me and considered in my medical decision making (see chart for details).     MDM:  Swab for gc/ct/trich/bv and   Final Clinical Impressions(s) / UC Diagnoses   Final diagnoses:  Vaginal discharge   Discharge Instructions   None    ED Prescriptions    None     PDMP not reviewed this encounter.  An After Visit Summary was printed and given to the patient.  Elson Areas, [place] 10/06/20 1041

## 2020-10-09 ENCOUNTER — Other Ambulatory Visit: Payer: Self-pay

## 2020-10-09 DIAGNOSIS — B9689 Other specified bacterial agents as the cause of diseases classified elsewhere: Secondary | ICD-10-CM

## 2020-10-09 DIAGNOSIS — N760 Acute vaginitis: Secondary | ICD-10-CM

## 2020-10-09 LAB — CERVICOVAGINAL ANCILLARY ONLY
Bacterial Vaginitis (gardnerella): POSITIVE — AB
Candida Glabrata: NEGATIVE
Candida Vaginitis: NEGATIVE
Chlamydia: NEGATIVE
Comment: NEGATIVE
Comment: NEGATIVE
Comment: NEGATIVE
Comment: NEGATIVE
Comment: NEGATIVE
Comment: NORMAL
Neisseria Gonorrhea: NEGATIVE
Trichomonas: NEGATIVE

## 2020-10-10 ENCOUNTER — Telehealth (HOSPITAL_COMMUNITY): Payer: Self-pay

## 2020-10-10 MED ORDER — METRONIDAZOLE 500 MG PO TABS: 500.0000 mg | ORAL_TABLET | Freq: Two times a day (BID) | ORAL | 0 refills | Status: DC

## 2020-10-10 NOTE — Telephone Encounter (Signed)
Patient with recent BV diagnosis after ER visit and requests refill on Metronidazole.  Request fulfilled and script sent to pharmacy on file.   Cherre Robins MSN, CNM Advanced Practice Provider, [facility]

## 2020-10-21 ENCOUNTER — Encounter: Payer: Self-pay | Admitting: Physician Assistant

## 2020-10-21 ENCOUNTER — Telehealth: Payer: Medicaid Other | Admitting: Physician Assistant

## 2020-10-21 DIAGNOSIS — B3731 Acute candidiasis of vulva and vagina: Secondary | ICD-10-CM

## 2020-10-21 DIAGNOSIS — B373 Candidiasis of vulva and vagina: Secondary | ICD-10-CM | POA: Diagnosis not present

## 2020-10-21 MED ORDER — FLUCONAZOLE 150 MG PO TABS: 150.0000 mg | ORAL_TABLET | Freq: Once | ORAL | 0 refills | Status: DC

## 2020-10-21 NOTE — Progress Notes (Signed)
We are sorry that you are not feeling well. Here is how we plan to help! Based on what you shared with me it looks like you: May have a yeast vaginosis  Vaginosis is an inflammation of the vagina that can result in discharge, itching and pain. The cause is usually a change in the normal balance of vaginal bacteria or an infection. Vaginosis can also result from reduced estrogen levels after menopause.  The most common causes of vaginosis are:   Bacterial vaginosis which results from an overgrowth of one on several organisms that are normally present in your vagina.   Yeast infections which are caused by a naturally occurring fungus called candida.   Vaginal atrophy (atrophic vaginosis) which results from the thinning of the vagina from reduced estrogen levels after menopause.   Trichomoniasis which is caused by a parasite and is commonly transmitted by sexual intercourse.  Factors that increase your risk of developing vaginosis include: . Medications, such as antibiotics and steroids . Uncontrolled diabetes . Use of hygiene products such as bubble bath, vaginal spray or vaginal deodorant . Douching . Wearing damp or tight-fitting clothing . Using an intrauterine device (IUD) for birth control . Hormonal changes, such as those associated with pregnancy, birth control pills or menopause . Sexual activity . Having a sexually transmitted infection  Your treatment plan is A single Diflucan (fluconazole) 150mg tablet once.  I have electronically sent this prescription into the pharmacy that you have chosen.  Be sure to take all of the medication as directed. Stop taking any medication if you develop a rash, tongue swelling or shortness of breath. Mothers who are breast feeding should consider pumping and discarding their breast milk while on these antibiotics. However, there is no consensus that infant exposure at these doses would be harmful.  Remember that medication creams can weaken latex  condoms. .   HOME CARE:  Good hygiene may prevent some types of vaginosis from recurring and may relieve some symptoms:  . Avoid baths, hot tubs and whirlpool spas. Rinse soap from your outer genital area after a shower, and dry the area well to prevent irritation. Don't use scented or harsh soaps, such as those with deodorant or antibacterial action. . Avoid irritants. These include scented tampons and pads. . Wipe from front to back after using the toilet. Doing so avoids spreading fecal bacteria to your vagina.  Other things that may help prevent vaginosis include:  . Don't douche. Your vagina doesn't require cleansing other than normal bathing. Repetitive douching disrupts the normal organisms that reside in the vagina and can actually increase your risk of vaginal infection. Douching won't clear up a vaginal infection. . Use a latex condom. Both female and female latex condoms may help you avoid infections spread by sexual contact. . Wear cotton underwear. Also wear pantyhose with a cotton crotch. If you feel comfortable without it, skip wearing underwear to bed. Yeast thrives in moist environments Your symptoms should improve in the next day or two.  GET HELP RIGHT AWAY IF:  . You have pain in your lower abdomen ( pelvic area or over your ovaries) . You develop nausea or vomiting . You develop a fever . Your discharge changes or worsens . You have persistent pain with intercourse . You develop shortness of breath, a rapid pulse, or you faint.  These symptoms could be signs of problems or infections that need to be evaluated by a medical provider now.  MAKE SURE YOU      Understand these instructions.  Will watch your condition.  Will get help right away if you are not doing well or get worse.  Your e-visit answers were reviewed by a board certified advanced clinical practitioner to complete your personal care plan. Depending upon the condition, your plan could have included  both over the counter or prescription medications. Please review your pharmacy choice to make sure that you have choses a pharmacy that is open for you to pick up any needed prescription, Your safety is important to us. If you have drug allergies check your prescription carefully.   You can use MyChart to ask questions about today's visit, request a non-urgent call back, or ask for a work or school excuse for 24 hours related to this e-Visit. If it has been greater than 24 hours you will need to follow up with your provider, or enter a new e-Visit to address those concerns. You will get a MyChart message within the next two days asking about your experience. I hope that your e-visit has been valuable and will speed your recovery.  I spent 5-10 minutes on review and completion of this note- Tytus Strahle PAC  

## 2020-11-01 ENCOUNTER — Telehealth: Payer: Medicaid Other | Admitting: Nurse Practitioner

## 2020-11-01 DIAGNOSIS — N898 Other specified noninflammatory disorders of vagina: Secondary | ICD-10-CM

## 2020-11-01 NOTE — Progress Notes (Signed)
Based on what you shared with me it looks like you have recurrent  Vaginal discharge,that should be evaluated in a face to face office visit. According to your chart you were just treated for this 10 days ago. You will need a face to face visit for proper evaluation and treatment of discharge.    NOTE: If you entered your credit card information for this eVisit, you will not be charged. You may see a "hold" on your card for the $35 but that hold will drop off and you will not have a charge processed.  If you are having a true medical emergency please call 911.     For an urgent face to face visit, [place] has four urgent care centers for your convenience:   . [facility]    [phone]                  Get Driving Directions  [address] [place], [place] [address] . 10 am to 8 pm Monday-Friday . 12 pm to 8 pm Saturday-Sunday   . [facility] at [facility]  [phone]                  Get Driving Directions  [address] [place], [place] [address] . 8 am to 8 pm Monday-Friday . 9 am to 6 pm Saturday . 11 am to 6 pm Sunday   . [facility] at [facility]  [phone]                  Get Driving Directions   [address] [place], [place] [address] . 8 am to 8 pm Monday-Friday . 8 am to 4 pm Saturday-Sunday    . [facility] at [facility] Directions  [phone]  [address] [place], [place] [address]  . Monday-Friday, 12 PM to 6 PM    Your e-visit answers were reviewed by a board certified advanced clinical practitioner to complete your personal care plan.  Thank you for using e-Visits.

## 2020-11-24 ENCOUNTER — Telehealth: Payer: Medicaid Other | Admitting: Emergency Medicine

## 2020-11-24 DIAGNOSIS — N760 Acute vaginitis: Secondary | ICD-10-CM

## 2020-11-24 NOTE — Progress Notes (Signed)
Based on what you shared with me, it looks like you have a recurrent infection.  You will need to be seen in-person so that cultures/swabs can be obtained.  This will help [place] know how to best treat you.  I feel your condition warrants further evaluation and I recommend that you be seen for a face to face office visit.   NOTE: If you entered your credit card information for this eVisit, you will not be charged. You may see a "hold" on your card for the $35 but that hold will drop off and you will not have a charge processed.   If you are having a true medical emergency please call 911.      For an urgent face to face visit, [place] has five urgent care centers for your convenience:     [facility] at [facility] Directions [phone] [address] [place], [place] [address] . 10 am - 6pm Monday - Friday    [facility] Center [facility] Get Driving Directions [phone] [address] [place], [place] [address] . 10 am to 8 pm Monday-Friday . 12 pm to 8 pm [facility] at [facility] Get Driving Directions [phone] [address] [place], [place] [address] . 8 am to 8 pm Monday-Friday . 9 am to 6 pm Saturday . 11 am to 6 pm Sunday     [facility] Urgent Care at [facility] Get Driving Directions  [phone] [address] [place], [place] [address] . 8 am to 8 pm Monday-Friday . 8 am to 4 pm [facility] Urgent Care at [facility] Directions [phone] [address] [place], [place] [address] . 12 pm to 6 pm Monday-Friday      Your e-visit answers were reviewed by a board certified advanced clinical practitioner to complete your personal care plan.  Thank you for using e-Visits.    Approximately 5 minutes was used in reviewing the patient's chart, questionnaire, prescribing medications, and  documentation.

## 2021-01-10 ENCOUNTER — Ambulatory Visit (INDEPENDENT_AMBULATORY_CARE_PROVIDER_SITE_OTHER): Payer: Medicaid Other | Admitting: Obstetrics and Gynecology

## 2021-01-10 ENCOUNTER — Encounter: Payer: Self-pay | Admitting: Obstetrics and Gynecology

## 2021-01-10 VITALS — BP 113/77 | HR 73 | Wt 150.0 lb

## 2021-01-10 DIAGNOSIS — Z113 Encounter for screening for infections with a predominantly sexual mode of transmission: Secondary | ICD-10-CM

## 2021-01-10 DIAGNOSIS — Z202 Contact with and (suspected) exposure to infections with a predominantly sexual mode of transmission: Secondary | ICD-10-CM | POA: Diagnosis not present

## 2021-01-10 DIAGNOSIS — Z3046 Encounter for surveillance of implantable subdermal contraceptive: Secondary | ICD-10-CM | POA: Insufficient documentation

## 2021-01-10 DIAGNOSIS — Z3049 Encounter for surveillance of other contraceptives: Secondary | ICD-10-CM

## 2021-01-10 DIAGNOSIS — Z975 Presence of (intrauterine) contraceptive device: Secondary | ICD-10-CM | POA: Insufficient documentation

## 2021-01-10 NOTE — Progress Notes (Signed)
Complains of vaginal discharge that started last week, denies any odor. Believes she has BV or yeast and wants a swab

## 2021-01-10 NOTE — Progress Notes (Signed)
Patient ID: Erin Bell, female   DOB: 25-Sep-1991, 30 y.o.   MRN: [id number]     GYNECOLOGY CLINIC PROCEDURE NOTE  Erin Bell is a 30 y.o. [id number] here for Nexplanon removal  Last pap smear was on 08/2018 and was normal.  Desires STD testing    Nexplanon Removal Patient identified, informed consent performed, consent signed.   Appropriate time out taken. Nexplanon site identified.  Area prepped in usual sterile fashon. One ml of 1% lidocaine was used to anesthetize the area at the distal end of the implant. A small stab incision was made right beside the implant on the distal portion.  The Nexplanon rod was grasped using hemostats and removed without difficulty.  There was minimal blood loss. There were no complications.  3 ml of 1% lidocaine was injected around the incision for post-procedure analgesia.  Steri-strips were applied over the small incision.  A pressure bandage was applied to reduce any bruising.  The patient tolerated the procedure well and was given post procedure instructions.  Patient is planning to use condom for contraception/attempt conception. STD testing collected as per pt request  Nettie Elm, MD, FACOG Attending Obstetrician & Gynecologist Center for [facility], [facility]

## 2021-01-10 NOTE — Patient Instructions (Signed)
Health Maintenance, Female Adopting a healthy lifestyle and getting preventive care are important in promoting health and wellness. Ask your health care provider about:  The right schedule for you to have regular tests and exams.  Things you can do on your own to prevent diseases and keep yourself healthy. What should I know about diet, weight, and exercise? Eat a healthy diet  Eat a diet that includes plenty of vegetables, fruits, low-fat dairy products, and lean protein.  Do not eat a lot of foods that are high in solid fats, added sugars, or sodium.   Maintain a healthy weight Body mass index (BMI) is used to identify weight problems. It estimates body fat based on height and weight. Your health care provider can help determine your BMI and help you achieve or maintain a healthy weight. Get regular exercise Get regular exercise. This is one of the most important things you can do for your health. Most adults should:  Exercise for at least 150 minutes each week. The exercise should increase your heart rate and make you sweat (moderate-intensity exercise).  Do strengthening exercises at least twice a week. This is in addition to the moderate-intensity exercise.  Spend less time sitting. Even light physical activity can be beneficial. Watch cholesterol and blood lipids Have your blood tested for lipids and cholesterol at 28 years of age, then have this test every 5 years. Have your cholesterol levels checked more often if:  Your lipid or cholesterol levels are high.  You are older than 28 years of age.  You are at high risk for heart disease. What should I know about cancer screening? Depending on your health history and family history, you may need to have cancer screening at various ages. This may include screening for:  Breast cancer.  Cervical cancer.  Colorectal cancer.  Skin cancer.  Lung cancer. What should I know about heart disease, diabetes, and high blood  pressure? Blood pressure and heart disease  High blood pressure causes heart disease and increases the risk of stroke. This is more likely to develop in people who have high blood pressure readings, are of African descent, or are overweight.  Have your blood pressure checked: ? Every 3-5 years if you are 18-39 years of age. ? Every year if you are 40 years old or older. Diabetes Have regular diabetes screenings. This checks your fasting blood sugar level. Have the screening done:  Once every three years after age 40 if you are at a normal weight and have a low risk for diabetes.  More often and at a younger age if you are overweight or have a high risk for diabetes. What should I know about preventing infection? Hepatitis B If you have a higher risk for hepatitis B, you should be screened for this virus. Talk with your health care provider to find out if you are at risk for hepatitis B infection. Hepatitis C Testing is recommended for:  Everyone born from 1945 through 1965.  Anyone with known risk factors for hepatitis C. Sexually transmitted infections (STIs)  Get screened for STIs, including gonorrhea and chlamydia, if: ? You are sexually active and are younger than 28 years of age. ? You are older than 28 years of age and your health care provider tells you that you are at risk for this type of infection. ? Your sexual activity has changed since you were last screened, and you are at increased risk for chlamydia or gonorrhea. Ask your health care provider   if you are at risk.  Ask your health care provider about whether you are at high risk for HIV. Your health care provider may recommend a prescription medicine to help prevent HIV infection. If you choose to take medicine to prevent HIV, you should first get tested for HIV. You should then be tested every 3 months for as long as you are taking the medicine. Pregnancy  If you are about to stop having your period (premenopausal) and  you may become pregnant, seek counseling before you get pregnant.  Take 400 to 800 micrograms (mcg) of folic acid every day if you become pregnant.  Ask for birth control (contraception) if you want to prevent pregnancy. Osteoporosis and menopause Osteoporosis is a disease in which the bones lose minerals and strength with aging. This can result in bone fractures. If you are 65 years old or older, or if you are at risk for osteoporosis and fractures, ask your health care provider if you should:  Be screened for bone loss.  Take a calcium or vitamin D supplement to lower your risk of fractures.  Be given hormone replacement therapy (HRT) to treat symptoms of menopause. Follow these instructions at home: Lifestyle  Do not use any products that contain nicotine or tobacco, such as cigarettes, e-cigarettes, and chewing tobacco. If you need help quitting, ask your health care provider.  Do not use street drugs.  Do not share needles.  Ask your health care provider for help if you need support or information about quitting drugs. Alcohol use  Do not drink alcohol if: ? Your health care provider tells you not to drink. ? You are pregnant, may be pregnant, or are planning to become pregnant.  If you drink alcohol: ? Limit how much you use to 0-1 drink a day. ? Limit intake if you are breastfeeding.  Be aware of how much alcohol is in your drink. In the [place] one drink equals one 12 oz bottle of beer (355 mL), one 5 oz glass of wine (148 mL), or one 1 oz glass of hard liquor (44 mL). General instructions  Schedule regular health, dental, and eye exams.  Stay current with your vaccines.  Tell your health care provider if: ? You often feel depressed. ? You have ever been abused or do not feel safe at home. Summary  Adopting a healthy lifestyle and getting preventive care are important in promoting health and wellness.  Follow your health care provider's instructions about healthy  diet, exercising, and getting tested or screened for diseases.  Follow your health care provider's instructions on monitoring your cholesterol and blood pressure. This information is not intended to replace advice given to you by your health care provider. Make sure you discuss any questions you have with your health care provider. Document Revised: 09/09/2018 Document Reviewed: 09/09/2018 Elsevier Patient Education  2021 Elsevier Inc.  

## 2021-01-11 ENCOUNTER — Other Ambulatory Visit: Payer: Self-pay | Admitting: *Deleted

## 2021-01-11 LAB — CERVICOVAGINAL ANCILLARY ONLY
Bacterial Vaginitis (gardnerella): NEGATIVE
Candida Glabrata: NEGATIVE
Candida Vaginitis: POSITIVE — AB
Chlamydia: NEGATIVE
Comment: NEGATIVE
Comment: NEGATIVE
Comment: NEGATIVE
Comment: NEGATIVE
Comment: NEGATIVE
Comment: NORMAL
Neisseria Gonorrhea: NEGATIVE
Trichomonas: NEGATIVE

## 2021-01-11 LAB — HEPATITIS C ANTIBODY: Hep C Virus Ab: <0.1 s/co ratio (ref 0.0–0.9)

## 2021-01-11 LAB — HIV ANTIBODY (ROUTINE TESTING W REFLEX): HIV Screen 4th Generation wRfx: NONREACTIVE

## 2021-01-11 LAB — HEPATITIS B SURFACE ANTIGEN: Hepatitis B Surface Ag: NEGATIVE

## 2021-01-11 LAB — RPR: RPR Ser Ql: NONREACTIVE

## 2021-01-11 MED ORDER — FLUCONAZOLE 150 MG PO TABS: 150.0000 mg | ORAL_TABLET | Freq: Once | ORAL | 0 refills | Status: DC

## 2021-01-15 ENCOUNTER — Encounter: Payer: Self-pay | Admitting: Lactation Services

## 2021-01-15 ENCOUNTER — Other Ambulatory Visit: Payer: Self-pay | Admitting: Lactation Services

## 2021-01-15 MED ORDER — FLUCONAZOLE 150 MG PO TABS: 150.0000 mg | ORAL_TABLET | Freq: Once | ORAL | 0 refills | Status: DC

## 2021-02-11 ENCOUNTER — Telehealth: Payer: Medicaid Other | Admitting: Physician Assistant

## 2021-02-11 DIAGNOSIS — B373 Candidiasis of vulva and vagina: Secondary | ICD-10-CM | POA: Diagnosis not present

## 2021-02-11 DIAGNOSIS — B3731 Acute candidiasis of vulva and vagina: Secondary | ICD-10-CM

## 2021-02-11 MED ORDER — FLUCONAZOLE 150 MG PO TABS: 150.0000 mg | ORAL_TABLET | Freq: Once | ORAL | 0 refills | Status: DC

## 2021-02-11 NOTE — Progress Notes (Signed)
I have spent 5 minutes in review of e-visit questionnaire, review and updating patient chart, medical decision making and response to patient.   Zyasia Halbleib Cody Jermany Sundell, PA-C    

## 2021-02-11 NOTE — Progress Notes (Signed)

## 2021-02-28 ENCOUNTER — Other Ambulatory Visit (HOSPITAL_COMMUNITY)
Admission: RE | Admit: 2021-02-28 | Discharge: 2021-02-28 | Disposition: A | Discharge status: Home / Self Care | Payer: Medicaid Other | Source: Ambulatory Visit | Attending: Family Medicine | Admitting: Family Medicine

## 2021-02-28 ENCOUNTER — Ambulatory Visit (INDEPENDENT_AMBULATORY_CARE_PROVIDER_SITE_OTHER): Payer: Medicaid Other

## 2021-02-28 ENCOUNTER — Other Ambulatory Visit: Payer: Self-pay

## 2021-02-28 VITALS — BP 110/68 | HR 58 | Wt 152.2 lb

## 2021-02-28 DIAGNOSIS — N898 Other specified noninflammatory disorders of vagina: Secondary | ICD-10-CM | POA: Diagnosis not present

## 2021-02-28 NOTE — Progress Notes (Signed)
Here today with complaint of watery discharge with itching/irritation. Denies any foul odor. Reports itching after urination and feeling of pressure during urination that lasted for a few days. Reports taking otc Azo medication with relief. Denies any urinary symptoms today. Self-swab instructions given and specimen obtained. Explained to pt that we will contact her via MyChart or phone with abnormal results. Explained we will treat based on results of vaginal swab as her symptoms are not definitive.   Fleet Contras RN 02/28/21

## 2021-02-28 NOTE — Progress Notes (Signed)
Agree with A & P. 

## 2021-03-02 ENCOUNTER — Other Ambulatory Visit: Payer: Self-pay | Admitting: Obstetrics and Gynecology

## 2021-03-02 LAB — CERVICOVAGINAL ANCILLARY ONLY
Bacterial Vaginitis (gardnerella): POSITIVE — AB
Candida Glabrata: NEGATIVE
Candida Vaginitis: NEGATIVE
Chlamydia: NEGATIVE
Comment: NEGATIVE
Comment: NEGATIVE
Comment: NEGATIVE
Comment: NEGATIVE
Comment: NEGATIVE
Comment: NORMAL
Neisseria Gonorrhea: NEGATIVE
Trichomonas: POSITIVE — AB

## 2021-03-02 MED ORDER — METRONIDAZOLE 500 MG PO TABS: 500.0000 mg | ORAL_TABLET | Freq: Two times a day (BID) | ORAL | 0 refills | Status: DC

## 2021-03-02 NOTE — Progress Notes (Signed)
Rx for BV and Tric sent to pharmacy

## 2021-03-08 ENCOUNTER — Other Ambulatory Visit: Payer: Self-pay | Admitting: *Deleted

## 2021-03-08 ENCOUNTER — Encounter: Payer: Self-pay | Admitting: *Deleted

## 2021-03-08 DIAGNOSIS — B3731 Acute candidiasis of vulva and vagina: Secondary | ICD-10-CM

## 2021-03-08 MED ORDER — FLUCONAZOLE 150 MG PO TABS: 150.0000 mg | ORAL_TABLET | Freq: Once | ORAL | 0 refills | Status: DC

## 2021-03-15 ENCOUNTER — Other Ambulatory Visit: Payer: Self-pay

## 2021-03-15 ENCOUNTER — Other Ambulatory Visit (HOSPITAL_COMMUNITY)
Admission: RE | Admit: 2021-03-15 | Discharge: 2021-03-15 | Disposition: A | Discharge status: Home / Self Care | Payer: Medicaid Other | Source: Ambulatory Visit | Attending: Obstetrics and Gynecology | Admitting: Obstetrics and Gynecology

## 2021-03-15 ENCOUNTER — Ambulatory Visit (INDEPENDENT_AMBULATORY_CARE_PROVIDER_SITE_OTHER): Payer: Medicaid Other | Admitting: Obstetrics and Gynecology

## 2021-03-15 ENCOUNTER — Encounter: Payer: Self-pay | Admitting: Obstetrics and Gynecology

## 2021-03-15 ENCOUNTER — Telehealth: Payer: Medicaid Other | Admitting: Physician Assistant

## 2021-03-15 VITALS — BP 108/78 | HR 70 | Ht 62.0 in | Wt 152.4 lb

## 2021-03-15 DIAGNOSIS — Z124 Encounter for screening for malignant neoplasm of cervix: Secondary | ICD-10-CM | POA: Diagnosis not present

## 2021-03-15 DIAGNOSIS — H60391 Other infective otitis externa, right ear: Secondary | ICD-10-CM | POA: Diagnosis not present

## 2021-03-15 DIAGNOSIS — Z113 Encounter for screening for infections with a predominantly sexual mode of transmission: Secondary | ICD-10-CM

## 2021-03-15 DIAGNOSIS — Z01419 Encounter for gynecological examination (general) (routine) without abnormal findings: Secondary | ICD-10-CM | POA: Insufficient documentation

## 2021-03-15 DIAGNOSIS — Z202 Contact with and (suspected) exposure to infections with a predominantly sexual mode of transmission: Secondary | ICD-10-CM

## 2021-03-15 MED ORDER — CIPRO HC 0.2-1 % OT SUSP: 3.0000 [drp] | Freq: Two times a day (BID) | OTIC | 0 refills | Status: DC

## 2021-03-15 NOTE — Patient Instructions (Signed)
Health Maintenance, Female Adopting a healthy lifestyle and getting preventive care are important in promoting health and wellness. Ask your health care provider about: The right schedule for you to have regular tests and exams. Things you can do on your own to prevent diseases and keep yourself healthy. What should I know about diet, weight, and exercise? Eat a healthy diet  Eat a diet that includes plenty of vegetables, fruits, low-fat dairy products, and lean protein. Do not eat a lot of foods that are high in solid fats, added sugars, or sodium.  Maintain a healthy weight Body mass index (BMI) is used to identify weight problems. It estimates body fat based on height and weight. Your health care provider can help determineyour BMI and help you achieve or maintain a healthy weight. Get regular exercise Get regular exercise. This is one of the most important things you can do for your health. Most adults should: Exercise for at least 150 minutes each week. The exercise should increase your heart rate and make you sweat (moderate-intensity exercise). Do strengthening exercises at least twice a week. This is in addition to the moderate-intensity exercise. Spend less time sitting. Even light physical activity can be beneficial. Watch cholesterol and blood lipids Have your blood tested for lipids and cholesterol at 28 years of age, then havethis test every 5 years. Have your cholesterol levels checked more often if: Your lipid or cholesterol levels are high. You are older than 28 years of age. You are at high risk for heart disease. What should I know about cancer screening? Depending on your health history and family history, you may need to have cancer screening at various ages. This may include screening for: Breast cancer. Cervical cancer. Colorectal cancer. Skin cancer. Lung cancer. What should I know about heart disease, diabetes, and high blood pressure? Blood pressure and heart  disease High blood pressure causes heart disease and increases the risk of stroke. This is more likely to develop in people who have high blood pressure readings, are of African descent, or are overweight. Have your blood pressure checked: Every 3-5 years if you are 18-39 years of age. Every year if you are 40 years old or older. Diabetes Have regular diabetes screenings. This checks your fasting blood sugar level. Have the screening done: Once every three years after age 40 if you are at a normal weight and have a low risk for diabetes. More often and at a younger age if you are overweight or have a high risk for diabetes. What should I know about preventing infection? Hepatitis B If you have a higher risk for hepatitis B, you should be screened for this virus. Talk with your health care provider to find out if you are at risk forhepatitis B infection. Hepatitis C Testing is recommended for: Everyone born from 1945 through 1965. Anyone with known risk factors for hepatitis C. Sexually transmitted infections (STIs) Get screened for STIs, including gonorrhea and chlamydia, if: You are sexually active and are younger than 28 years of age. You are older than 28 years of age and your health care provider tells you that you are at risk for this type of infection. Your sexual activity has changed since you were last screened, and you are at increased risk for chlamydia or gonorrhea. Ask your health care provider if you are at risk. Ask your health care provider about whether you are at high risk for HIV. Your health care provider may recommend a prescription medicine to help   prevent HIV infection. If you choose to take medicine to prevent HIV, you should first get tested for HIV. You should then be tested every 3 months for as long as you are taking the medicine. Pregnancy If you are about to stop having your period (premenopausal) and you may become pregnant, seek counseling before you get  pregnant. Take 400 to 800 micrograms (mcg) of folic acid every day if you become pregnant. Ask for birth control (contraception) if you want to prevent pregnancy. Osteoporosis and menopause Osteoporosis is a disease in which the bones lose minerals and strength with aging. This can result in bone fractures. If you are 65 years old or older, or if you are at risk for osteoporosis and fractures, ask your health care provider if you should: Be screened for bone loss. Take a calcium or vitamin D supplement to lower your risk of fractures. Be given hormone replacement therapy (HRT) to treat symptoms of menopause. Follow these instructions at home: Lifestyle Do not use any products that contain nicotine or tobacco, such as cigarettes, e-cigarettes, and chewing tobacco. If you need help quitting, ask your health care provider. Do not use street drugs. Do not share needles. Ask your health care provider for help if you need support or information about quitting drugs. Alcohol use Do not drink alcohol if: Your health care provider tells you not to drink. You are pregnant, may be pregnant, or are planning to become pregnant. If you drink alcohol: Limit how much you use to 0-1 drink a day. Limit intake if you are breastfeeding. Be aware of how much alcohol is in your drink. In the [place] one drink equals one 12 oz bottle of beer (355 mL), one 5 oz glass of wine (148 mL), or one 1 oz glass of hard liquor (44 mL). General instructions Schedule regular health, dental, and eye exams. Stay current with your vaccines. Tell your health care provider if: You often feel depressed. You have ever been abused or do not feel safe at home. Summary Adopting a healthy lifestyle and getting preventive care are important in promoting health and wellness. Follow your health care provider's instructions about healthy diet, exercising, and getting tested or screened for diseases. Follow your health care provider's  instructions on monitoring your cholesterol and blood pressure. This information is not intended to replace advice given to you by your health care provider. Make sure you discuss any questions you have with your healthcare provider. Document Revised: 09/09/2018 Document Reviewed: 09/09/2018 Elsevier Patient Education  2022 Elsevier Inc.  

## 2021-03-15 NOTE — Progress Notes (Signed)
Erin Bell is a 30 y.o. [phone] female here for a routine annual gynecologic exam.  Current complaints: Desires STD testing.   Denies abnormal vaginal bleeding, discharge, pelvic pain, problems with intercourse or other gynecologic concerns.    Gynecologic History Patient's last menstrual period was 02/28/2021 (exact date). Contraception: none Last Pap: 06/2018. Results were: normal Last mammogram: NA.   Obstetric History OB History  Gravida Para Term Preterm AB Living  2 2 2  0 0 2  SAB IAB Ectopic Multiple Live Births  0 0 0 0 2    # Outcome Date GA Lbr Len/2nd Weight Sex Delivery Anes PTL Lv  2 Term 05/17/17 [redacted]w[redacted]d 02:05 / 00:26 6 lb 1.7 oz (2.77 kg) M VBAC EPI  LIV     Birth Comments: WNL  1 Term 01/15/13 [redacted]w[redacted]d 22:30 / 00:51 6 lb 0.8 oz (2.745 kg) F CS-LVertical Spinal  LIV    Obstetric Comments  Delivery note attached to prenatal record indicates 2014 low transverse c/s     Past Medical History:  Diagnosis Date   Chlamydia    Depression    Previously treated with Risperdal   Hypothyroidism     Past Surgical History:  Procedure Laterality Date   CESAREAN SECTION N/A 01/15/2013   Procedure: CESAREAN SECTION;  Surgeon: 01/17/2013, MD;  Location: [facility];  Service: Obstetrics;  Laterality: N/A;   NOSE SURGERY     WISDOM TOOTH EXTRACTION      No current outpatient medications on file prior to visit.   No current facility-administered medications on file prior to visit.    No Known Allergies  Social History   Socioeconomic History   Marital status: Single    Spouse name: Not on file   Number of children: Not on file   Years of education: Not on file   Highest education level: Not on file  Occupational History   Not on file  Tobacco Use   Smoking status: Former    Packs/day: 0.25    Pack years: 0.00    Types: Cigarettes   Smokeless tobacco: Never  Vaping Use   Vaping Use: Never used  Substance and Sexual Activity   Alcohol use: No    Comment:  occasional   Drug use: No   Sexual activity: Yes    Birth control/protection: Other-see comments  Other Topics Concern   Not on file  Social History Narrative   Not on file   Social Determinants of Health   Financial Resource Strain: Not on file  Food Insecurity: No Food Insecurity   Worried About Running Out of Food in the Last Year: Never true   Ran Out of Food in the Last Year: Never true  Transportation Needs: No Transportation Needs   Lack of Transportation (Medical): No   Lack of Transportation (Non-Medical): No  Physical Activity: Not on file  Stress: Not on file  Social Connections: Not on file  Intimate Partner Violence: Not on file    Family History  Problem Relation Age of Onset   Arthritis Father    Birth defects Sister        spina bifida   Asthma Brother    Diabetes Maternal Uncle    Thyroid disease Mother    Other Neg Hx    Alcohol abuse Neg Hx    Hypertension Neg Hx    Hyperlipidemia Neg Hx    Heart disease Neg Hx    Hearing loss Neg Hx    Early death  Neg Hx    Drug abuse Neg Hx    Miscarriages / Stillbirths Neg Hx    Mental retardation Neg Hx    Mental illness Neg Hx    Learning disabilities Neg Hx    Kidney disease Neg Hx    Stroke Neg Hx    Vision loss Neg Hx     The following portions of the patient's history were reviewed and updated as appropriate: allergies, current medications, past family history, past medical history, past social history, past surgical history and problem list.  Review of Systems Pertinent items noted in HPI and remainder of comprehensive ROS otherwise negative.   Objective:  BP 108/78   Pulse 70   Ht 5\' 2"  (1.575 m)   Wt 152 lb 6.4 oz (69.1 kg)   LMP 02/28/2021 (Exact Date)   BMI 27.87 kg/m  CONSTITUTIONAL: Well-developed, well-nourished female in no acute distress.  HENT:  Normocephalic, atraumatic, External right and left ear normal. Oropharynx is clear and moist EYES: Conjunctivae and EOM are normal. Pupils  are equal, round, and reactive to light. No scleral icterus.  NECK: Normal range of motion, supple, no masses.  Normal thyroid.  SKIN: Skin is warm and dry. No rash noted. Not diaphoretic. No erythema. No pallor. NEUROLGIC: Alert and oriented to person, place, and time. Normal reflexes, muscle tone coordination. No cranial nerve deficit noted. PSYCHIATRIC: Normal mood and affect. Normal behavior. Normal judgment and thought content. CARDIOVASCULAR: Normal heart rate noted, regular rhythm RESPIRATORY: Clear to auscultation bilaterally. Effort and breath sounds normal, no problems with respiration noted. BREASTS: Symmetric in size. No masses, skin changes, nipple drainage, or lymphadenopathy. ABDOMEN: Soft, normal bowel sounds, no distention noted.  No tenderness, rebound or guarding.  PELVIC: Normal appearing external genitalia; normal appearing vaginal mucosa and cervix.  No abnormal discharge noted.  Pap smear obtained.  Normal uterine size, no other palpable masses, no uterine or adnexal tenderness. MUSCULOSKELETAL: Normal range of motion. No tenderness.  No cyanosis, clubbing, or edema.  2+ distal pulses.   Assessment:  Annual gynecologic examination with pap smear STD testing  Plan:  Will follow up results of pap smear and manage accordingly. Routine preventative health maintenance measures emphasized. Please refer to After Visit Summary for other counseling recommendations.    04/30/2021, MD, FACOG Attending Obstetrician & Gynecologist Center for [facility], [facility]

## 2021-03-15 NOTE — Progress Notes (Signed)
E Visit for Swimmer's Ear  We are sorry that you are not feeling well. Here is how we plan to help!  I have prescribed: Ciprofloxin 0.2% and hydrocortisone 1% otic suspension 3 drops in affected ears twice daily for 7 days   In certain cases swimmer's ear may progress to a more serious bacterial infection of the middle or inner ear.  If you have a fever 102 and up and significantly worsening symptoms, this could indicate a more serious infection moving to the middle/inner and needs face to face evaluation in an office by a provider.  Your symptoms should improve over the next 3 days and should resolve in about 7 days.  HOME CARE:  Wash your hands frequently. Do not place the tip of the bottle on your ear or touch it with your fingers. You can take Acetominophen 650 mg every 4-6 hours as needed for pain.  If pain is severe or moderate, you can apply a heating pad (set on low) or hot water bottle (wrapped in a towel) to outer ear for 20 minutes.  This will also increase drainage. Avoid ear plugs Do not use Q-tips After showers, help the water run out by tilting your head to one side.  GET HELP RIGHT AWAY IF:  Fever is over 102.2 degrees. You develop progressive ear pain or hearing loss. Ear symptoms persist longer than 3 days after treatment.  MAKE SURE YOU:  Understand these instructions. Will watch your condition. Will get help right away if you are not doing well or get worse.  TO PREVENT SWIMMER'S EAR: Use a bathing cap or custom fitted swim molds to keep your ears dry. Towel off after swimming to dry your ears. Tilt your head or pull your earlobes to allow the water to escape your ear canal. If there is still water in your ears, consider using a hairdryer on the lowest setting.  Thank you for choosing an e-visit. Your e-visit answers were reviewed by a board certified advanced clinical practitioner to complete your personal care plan. Depending upon the condition, your plan  could have included both over the counter or prescription medications. Please review your pharmacy choice. Be sure that the pharmacy you have chosen is open so that you can pick up your prescription now.  If there is a problem you may message your provider in MyChart to have the prescription routed to another pharmacy. Your safety is important to [place]. If you have drug allergies check your prescription carefully.  For the next 24 hours, you can use MyChart to ask questions about today's visit, request a non-urgent call back, or ask for a work or school excuse from your e-visit provider. You will get an email in the next two days asking about your experience. I hope that your e-visit has been valuable and will speed your recovery.  I provided 5 minutes of non face-to-face time during this encounter for chart review and documentation.

## 2021-03-16 LAB — CYTOLOGY - PAP
Adequacy: ABSENT
Chlamydia: NEGATIVE
Comment: NEGATIVE
Comment: NEGATIVE
Comment: NORMAL
Diagnosis: NEGATIVE
Neisseria Gonorrhea: NEGATIVE
Trichomonas: NEGATIVE

## 2021-03-16 LAB — RPR: RPR Ser Ql: NONREACTIVE

## 2021-03-16 LAB — HEPATITIS B SURFACE ANTIGEN: Hepatitis B Surface Ag: NEGATIVE

## 2021-03-16 LAB — HEPATITIS C ANTIBODY: Hep C Virus Ab: 0.2 s/co ratio (ref 0.0–0.9)

## 2021-03-16 LAB — HIV ANTIBODY (ROUTINE TESTING W REFLEX): HIV Screen 4th Generation wRfx: NONREACTIVE

## 2021-03-16 MED ORDER — CIPROFLOXACIN-DEXAMETHASONE 0.3-0.1 % OT SUSP: OTIC | 0 refills | Status: DC

## 2021-03-16 NOTE — Progress Notes (Signed)
Received notification that patient insurance did not cover original otic drops sent in. Will cover Ciprodex. Rx updated. Patient notified via MyChart.

## 2021-03-16 NOTE — Addendum Note (Signed)
Addended by: Waldon Merl on: 03/16/2021 03:27 PM   Modules accepted: Orders

## 2021-03-26 ENCOUNTER — Other Ambulatory Visit: Payer: Self-pay | Admitting: Lactation Services

## 2021-03-26 MED ORDER — FLUCONAZOLE 150 MG PO TABS: 150.0000 mg | ORAL_TABLET | Freq: Once | ORAL | 0 refills | Status: DC

## 2021-03-26 NOTE — Progress Notes (Signed)
Diflucan sent to pharmacy for [place] discharge and vaginal itching post Flagyl treatment.

## 2021-04-17 ENCOUNTER — Other Ambulatory Visit: Payer: Self-pay

## 2021-04-17 DIAGNOSIS — B379 Candidiasis, unspecified: Secondary | ICD-10-CM

## 2021-04-17 MED ORDER — FLUCONAZOLE 150 MG PO TABS: 150.0000 mg | ORAL_TABLET | Freq: Once | ORAL | 0 refills | Status: DC

## 2021-04-19 ENCOUNTER — Telehealth: Payer: Medicaid Other | Admitting: Orthopedic Surgery

## 2021-04-19 DIAGNOSIS — N760 Acute vaginitis: Secondary | ICD-10-CM | POA: Diagnosis not present

## 2021-04-19 MED ORDER — CLINDAMYCIN PHOSPHATE 2 % VA CREA: 1.0000 | TOPICAL_CREAM | Freq: Every day | VAGINAL | 0 refills | Status: DC

## 2021-04-19 MED ORDER — METRONIDAZOLE 500 MG PO TABS: 500.0000 mg | ORAL_TABLET | Freq: Three times a day (TID) | ORAL | 0 refills | Status: AC

## 2021-04-19 NOTE — Addendum Note (Signed)
Addended by: Charma Igo on: 04/19/2021 05:03 PM   Modules accepted: Orders

## 2021-04-19 NOTE — Progress Notes (Signed)
E-Visit for Vaginal Symptoms  We are sorry that you are not feeling well. Here is how we plan to help! Based on what you shared with me it looks like you: May have a vaginosis due to bacteria  Vaginosis is an inflammation of the vagina that can result in discharge, itching and pain. The cause is usually a change in the normal balance of vaginal bacteria or an infection. Vaginosis can also result from reduced estrogen levels after menopause.  The most common causes of vaginosis are:   Bacterial vaginosis which results from an overgrowth of one on several organisms that are normally present in your vagina.   Yeast infections which are caused by a naturally occurring fungus called candida.   Vaginal atrophy (atrophic vaginosis) which results from the thinning of the vagina from reduced estrogen levels after menopause.   Trichomoniasis which is caused by a parasite and is commonly transmitted by sexual intercourse.  Factors that increase your risk of developing vaginosis include: Medications, such as antibiotics and steroids Uncontrolled diabetes Use of hygiene products such as bubble bath, vaginal spray or vaginal deodorant Douching Wearing damp or tight-fitting clothing Using an intrauterine device (IUD) for birth control Hormonal changes, such as those associated with pregnancy, birth control pills or menopause Sexual activity Having a sexually transmitted infection  Your treatment plan is Metronidazole or Flagyl 500mg twice a day for 7 days.  I have electronically sent this prescription into the pharmacy that you have chosen.  Be sure to take all of the medication as directed. Stop taking any medication if you develop a rash, tongue swelling or shortness of breath. Mothers who are breast feeding should consider pumping and discarding their breast milk while on these antibiotics. However, there is no consensus that infant exposure at these doses would be harmful.  Remember that  medication creams can weaken latex condoms. .   HOME CARE:  Good hygiene may prevent some types of vaginosis from recurring and may relieve some symptoms:  Avoid baths, hot tubs and whirlpool spas. Rinse soap from your outer genital area after a shower, and dry the area well to prevent irritation. Don't use scented or harsh soaps, such as those with deodorant or antibacterial action. Avoid irritants. These include scented tampons and pads. Wipe from front to back after using the toilet. Doing so avoids spreading fecal bacteria to your vagina.  Other things that may help prevent vaginosis include:  Don't douche. Your vagina doesn't require cleansing other than normal bathing. Repetitive douching disrupts the normal organisms that reside in the vagina and can actually increase your risk of vaginal infection. Douching won't clear up a vaginal infection. Use a latex condom. Both female and female latex condoms may help you avoid infections spread by sexual contact. Wear cotton underwear. Also wear pantyhose with a cotton crotch. If you feel comfortable without it, skip wearing underwear to bed. Yeast thrives in moist environments Your symptoms should improve in the next day or two.  GET HELP RIGHT AWAY IF:  You have pain in your lower abdomen ( pelvic area or over your ovaries) You develop nausea or vomiting You develop a fever Your discharge changes or worsens You have persistent pain with intercourse You develop shortness of breath, a rapid pulse, or you faint.  These symptoms could be signs of problems or infections that need to be evaluated by a medical provider now.  MAKE SURE YOU   Understand these instructions. Will watch your condition. Will get help right   away if you are not doing well or get worse.  Thank you for choosing an e-visit.  Your e-visit answers were reviewed by a board certified advanced clinical practitioner to complete your personal care plan. Depending upon the  condition, your plan could have included both over the counter or prescription medications.  Please review your pharmacy choice. Make sure the pharmacy is open so you can pick up prescription now. If there is a problem, you may contact your provider through [employer] and have the prescription routed to another pharmacy.  Your safety is important to [place]. If you have drug allergies check your prescription carefully.   For the next 24 hours you can use MyChart to ask questions about today's visit, request a non-urgent call back, or ask for a work or school excuse. You will get an email in the next two days asking about your experience. I hope that your e-visit has been valuable and will speed your recovery.  Greater than 5 minutes, yet less than 10 minutes of time have been spent researching, coordinating and implementing care for this patient today.

## 2021-06-05 ENCOUNTER — Telehealth: Payer: Medicaid Other | Admitting: Physician Assistant

## 2021-06-05 DIAGNOSIS — B373 Candidiasis of vulva and vagina: Secondary | ICD-10-CM | POA: Diagnosis not present

## 2021-06-05 DIAGNOSIS — B3731 Acute candidiasis of vulva and vagina: Secondary | ICD-10-CM

## 2021-06-05 MED ORDER — FLUCONAZOLE 150 MG PO TABS: 150.0000 mg | ORAL_TABLET | Freq: Once | ORAL | 0 refills | Status: DC

## 2021-06-05 NOTE — Progress Notes (Signed)
I have spent 5 minutes in review of e-visit questionnaire, review and updating patient chart, medical decision making and response to patient.   Meshach Perry Cody Keylen Uzelac, PA-C    

## 2021-06-05 NOTE — Progress Notes (Signed)

## 2021-08-13 ENCOUNTER — Other Ambulatory Visit: Payer: Self-pay

## 2021-08-13 DIAGNOSIS — Z792 Long term (current) use of antibiotics: Secondary | ICD-10-CM

## 2021-08-13 DIAGNOSIS — B379 Candidiasis, unspecified: Secondary | ICD-10-CM

## 2021-08-13 MED ORDER — FLUCONAZOLE 150 MG PO TABS: 150.0000 mg | ORAL_TABLET | Freq: Once | ORAL | 0 refills | Status: DC

## 2021-09-24 ENCOUNTER — Encounter: Payer: Self-pay | Admitting: Obstetrics and Gynecology

## 2022-03-27 ENCOUNTER — Other Ambulatory Visit (HOSPITAL_COMMUNITY)
Admission: RE | Admit: 2022-03-27 | Discharge: 2022-03-27 | Disposition: A | Discharge status: Home / Self Care | Payer: Medicaid Other | Source: Ambulatory Visit | Attending: Family Medicine | Admitting: Family Medicine

## 2022-03-27 ENCOUNTER — Ambulatory Visit (INDEPENDENT_AMBULATORY_CARE_PROVIDER_SITE_OTHER): Payer: Medicaid Other | Admitting: Family Medicine

## 2022-03-27 ENCOUNTER — Other Ambulatory Visit: Payer: Self-pay

## 2022-03-27 ENCOUNTER — Encounter: Payer: Self-pay | Admitting: Family Medicine

## 2022-03-27 VITALS — BP 121/91 | HR 87 | Wt 137.3 lb

## 2022-03-27 DIAGNOSIS — N898 Other specified noninflammatory disorders of vagina: Secondary | ICD-10-CM

## 2022-03-27 NOTE — Progress Notes (Signed)
   Subjective:    Patient ID: Erin Bell, female    DOB: 02-21-1991, 31 y.o.   MRN: [id number]  HPI  Patient seen for recurrent vaginal infections. Has infections every month or two. Calls in for prescription. Not currently sexually active with a partner - he passed away 2 weeks ago.  Review of Systems     Objective:   Physical Exam Vitals reviewed.  Constitutional:      Appearance: Normal appearance.  Cardiovascular:     Rate and Rhythm: Normal rate and regular rhythm.     Pulses: Normal pulses.     Heart sounds: Normal heart sounds.  Pulmonary:     Effort: Pulmonary effort is normal.     Breath sounds: Normal breath sounds.  Abdominal:     General: Abdomen is flat.     Palpations: Abdomen is soft.  Skin:    Capillary Refill: Capillary refill takes less than 2 seconds.  Neurological:     General: No focal deficit present.     Mental Status: She is alert.  Psychiatric:        Mood and Affect: Mood normal.        Behavior: Behavior normal.        Thought Content: Thought content normal.        Assessment & Plan:  1. Vaginal discharge Reviewed cultures from 2022 - has had STIs twice, along with BV and yeast. Self swab today. Recommended boric acid Condoms for every sexual encounter.

## 2022-03-28 LAB — CERVICOVAGINAL ANCILLARY ONLY
Bacterial Vaginitis (gardnerella): POSITIVE — AB
Candida Glabrata: NEGATIVE
Candida Vaginitis: NEGATIVE
Chlamydia: NEGATIVE
Comment: NEGATIVE
Comment: NEGATIVE
Comment: NEGATIVE
Comment: NEGATIVE
Comment: NEGATIVE
Comment: NORMAL
Neisseria Gonorrhea: NEGATIVE
Trichomonas: NEGATIVE

## 2022-03-28 MED ORDER — METRONIDAZOLE 500 MG PO TABS: 500.0000 mg | ORAL_TABLET | Freq: Two times a day (BID) | ORAL | 0 refills | Status: DC

## 2022-03-28 NOTE — Addendum Note (Signed)
Addended by: Levie Heritage on: 03/28/2022 05:24 PM   Modules accepted: Orders

## 2022-03-29 ENCOUNTER — Other Ambulatory Visit: Payer: Self-pay | Admitting: *Deleted

## 2022-03-29 ENCOUNTER — Encounter: Payer: Self-pay | Admitting: Family Medicine

## 2022-03-29 MED ORDER — FLUCONAZOLE 150 MG PO TABS: 150.0000 mg | ORAL_TABLET | Freq: Once | ORAL | 0 refills | Status: DC

## 2022-05-08 ENCOUNTER — Telehealth: Payer: Medicaid Other | Admitting: Physician Assistant

## 2022-05-08 DIAGNOSIS — B9689 Other specified bacterial agents as the cause of diseases classified elsewhere: Secondary | ICD-10-CM

## 2022-05-08 DIAGNOSIS — N760 Acute vaginitis: Secondary | ICD-10-CM

## 2022-05-08 MED ORDER — METRONIDAZOLE 500 MG PO TABS: 500.0000 mg | ORAL_TABLET | Freq: Two times a day (BID) | ORAL | 0 refills | Status: DC

## 2022-05-08 NOTE — Progress Notes (Signed)

## 2022-07-14 ENCOUNTER — Other Ambulatory Visit (INDEPENDENT_AMBULATORY_CARE_PROVIDER_SITE_OTHER): Payer: Self-pay | Admitting: Physician Assistant

## 2022-07-14 DIAGNOSIS — B9689 Other specified bacterial agents as the cause of diseases classified elsewhere: Secondary | ICD-10-CM

## 2022-07-18 ENCOUNTER — Other Ambulatory Visit (INDEPENDENT_AMBULATORY_CARE_PROVIDER_SITE_OTHER): Payer: Self-pay | Admitting: Physician Assistant

## 2022-07-18 DIAGNOSIS — B9689 Other specified bacterial agents as the cause of diseases classified elsewhere: Secondary | ICD-10-CM

## 2022-07-19 ENCOUNTER — Telehealth: Payer: Medicaid Other | Admitting: Emergency Medicine

## 2022-07-19 DIAGNOSIS — N760 Acute vaginitis: Secondary | ICD-10-CM | POA: Diagnosis not present

## 2022-07-19 DIAGNOSIS — B9689 Other specified bacterial agents as the cause of diseases classified elsewhere: Secondary | ICD-10-CM | POA: Diagnosis not present

## 2022-07-19 MED ORDER — METRONIDAZOLE 500 MG PO TABS: 500.0000 mg | ORAL_TABLET | Freq: Two times a day (BID) | ORAL | 0 refills | Status: AC

## 2022-07-19 NOTE — Progress Notes (Addendum)
E-Visit for Vaginal Symptoms  We are sorry that you are not feeling well. Here is how we plan to help! Based on what you shared with me it looks like you: May have a vaginosis due to bacteria  Vaginosis is an inflammation of the vagina that can result in discharge, itching and pain. The cause is usually a change in the normal balance of vaginal bacteria or an infection. Vaginosis can also result from reduced estrogen levels after menopause.  The most common causes of vaginosis are:   Bacterial vaginosis which results from an overgrowth of one on several organisms that are normally present in your vagina.   Yeast infections which are caused by a naturally occurring fungus called candida.   Vaginal atrophy (atrophic vaginosis) which results from the thinning of the vagina from reduced estrogen levels after menopause.   Trichomoniasis which is caused by a parasite and is commonly transmitted by sexual intercourse.  Factors that increase your risk of developing vaginosis include: Medications, such as antibiotics and steroids Uncontrolled diabetes Use of hygiene products such as bubble bath, vaginal spray or vaginal deodorant Douching Wearing damp or tight-fitting clothing Using an intrauterine device (IUD) for birth control Hormonal changes, such as those associated with pregnancy, birth control pills or menopause Sexual activity Having a sexually transmitted infection  Your treatment plan is Metronidazole or Flagyl 500mg twice a day for 7 days.  I have electronically sent this prescription into the pharmacy that you have chosen.  Be sure to take all of the medication as directed. Stop taking any medication if you develop a rash, tongue swelling or shortness of breath. Mothers who are breast feeding should consider pumping and discarding their breast milk while on these antibiotics. However, there is no consensus that infant exposure at these doses would be harmful.  Remember that  medication creams can weaken latex condoms. .   HOME CARE:  Good hygiene may prevent some types of vaginosis from recurring and may relieve some symptoms:  Avoid baths, hot tubs and whirlpool spas. Rinse soap from your outer genital area after a shower, and dry the area well to prevent irritation. Don't use scented or harsh soaps, such as those with deodorant or antibacterial action. Avoid irritants. These include scented tampons and pads. Wipe from front to back after using the toilet. Doing so avoids spreading fecal bacteria to your vagina.  Other things that may help prevent vaginosis include:  Don't douche. Your vagina doesn't require cleansing other than normal bathing. Repetitive douching disrupts the normal organisms that reside in the vagina and can actually increase your risk of vaginal infection. Douching won't clear up a vaginal infection. Use a latex condom. Both female and female latex condoms may help you avoid infections spread by sexual contact. Wear cotton underwear. Also wear pantyhose with a cotton crotch. If you feel comfortable without it, skip wearing underwear to bed. Yeast thrives in moist environments Your symptoms should improve in the next day or two.  GET HELP RIGHT AWAY IF:  You have pain in your lower abdomen ( pelvic area or over your ovaries) You develop nausea or vomiting You develop a fever Your discharge changes or worsens You have persistent pain with intercourse You develop shortness of breath, a rapid pulse, or you faint.  These symptoms could be signs of problems or infections that need to be evaluated by a medical provider now.  MAKE SURE YOU   Understand these instructions. Will watch your condition. Will get help right   away if you are not doing well or get worse.  Thank you for choosing an e-visit.  Your e-visit answers were reviewed by a board certified advanced clinical practitioner to complete your personal care plan. Depending upon the  condition, your plan could have included both over the counter or prescription medications.  Please review your pharmacy choice. Make sure the pharmacy is open so you can pick up prescription now. If there is a problem, you may contact your provider through MyChart messaging and have the prescription routed to another pharmacy.  Your safety is important to us. If you have drug allergies check your prescription carefully.   For the next 24 hours you can use MyChart to ask questions about today's visit, request a non-urgent call back, or ask for a work or school excuse. You will get an email in the next two days asking about your experience. I hope that your e-visit has been valuable and will speed your recovery.  I have spent 5 minutes in review of e-visit questionnaire, review and updating patient chart, medical decision making and response to patient.   Erin Ivanov, PhD, FNP-BC   

## 2022-07-24 ENCOUNTER — Telehealth: Payer: Medicaid Other | Admitting: Nurse Practitioner

## 2022-07-24 DIAGNOSIS — B3731 Acute candidiasis of vulva and vagina: Secondary | ICD-10-CM

## 2022-07-24 MED ORDER — FLUCONAZOLE 150 MG PO TABS: 150.0000 mg | ORAL_TABLET | Freq: Once | ORAL | 0 refills | Status: DC

## 2022-07-24 NOTE — Progress Notes (Signed)

## 2022-07-25 NOTE — Progress Notes (Signed)
Erroneous note

## 2022-07-26 ENCOUNTER — Ambulatory Visit
Admission: EM | Admit: 2022-07-26 | Discharge: 2022-07-26 | Disposition: A | Discharge status: Home / Self Care | Payer: Medicaid Other | Attending: Physician Assistant | Admitting: Physician Assistant

## 2022-07-26 DIAGNOSIS — Z113 Encounter for screening for infections with a predominantly sexual mode of transmission: Secondary | ICD-10-CM

## 2022-07-26 DIAGNOSIS — L309 Dermatitis, unspecified: Secondary | ICD-10-CM | POA: Diagnosis present

## 2022-07-26 DIAGNOSIS — N898 Other specified noninflammatory disorders of vagina: Secondary | ICD-10-CM | POA: Diagnosis present

## 2022-07-26 MED ORDER — FLUCONAZOLE 150 MG PO TABS: ORAL_TABLET | ORAL | 0 refills | Status: DC

## 2022-07-26 MED ORDER — TRIAMCINOLONE ACETONIDE 0.1 % EX CREA: 1.0000 | TOPICAL_CREAM | Freq: Two times a day (BID) | CUTANEOUS | 0 refills | Status: DC

## 2022-07-26 NOTE — ED Triage Notes (Signed)
Pt presents with exposure to possible unknown STD; pt states she was just recently treated for BV and yeast infection.  Pt states she has a rash on her vagina that she wants evaluated.

## 2022-07-26 NOTE — ED Provider Notes (Signed)
[facility]    CSN: [id number] Arrival date & time: 07/26/22  0807      History   Chief Complaint No chief complaint on file.   HPI Erin Bell is a 31 y.o. female.   Patient here today for evaluation of possible STD exposure.  She reports that she was recently treated for BV and yeast however feels as if yeast infection may be returning due to a thick [place] discharge.  She is concerned she may have a rash at her vagina but is unsure if she actually has any lesions.  She is most concerned for herpes.  She has not had outbreak in the past.  She also reports rash to her face that is been ongoing for the last 5 years.  She notes that rash is intermittent and is itchy and dry.  She has been using child's eczema cream with minimal relief.  The history is provided by the patient.    Past Medical History:  Diagnosis Date   Chlamydia    Depression    Previously treated with Risperdal   Hypothyroidism     Patient Active Problem List   Diagnosis Date Noted   Visit for routine gyn exam 03/15/2021   Possible exposure to STD 01/10/2021   History of cesarean delivery 05/17/2017    Past Surgical History:  Procedure Laterality Date   CESAREAN SECTION N/A 01/15/2013   Procedure: CESAREAN SECTION;  Surgeon: Frederico Hamman, MD;  Location: [facility];  Service: Obstetrics;  Laterality: N/A;   NOSE SURGERY     WISDOM TOOTH EXTRACTION      OB History     Gravida  2   Para  2   Term  2   Preterm  0   AB  0   Living  2      SAB  0   IAB  0   Ectopic  0   Multiple  0   Live Births  2        Obstetric Comments  Delivery note attached to prenatal record indicates 2014 low transverse c/s           Home Medications    Prior to Admission medications   Medication Sig Start Date End Date Taking? Authorizing Provider  fluconazole (DIFLUCAN) 150 MG tablet Take one tab PO today and repeat dose in 3 days if symptoms persist 07/26/22  Yes Francene Finders, PA-C  triamcinolone cream (KENALOG) 0.1 % Apply 1 Application topically 2 (two) times daily. 07/26/22  Yes Francene Finders, PA-C  metroNIDAZOLE (FLAGYL) 500 MG tablet Take 1 tablet (500 mg total) by mouth 2 (two) times daily for 7 days. 07/19/22 07/26/22  Carvel Getting, NP    Family History Family History  Problem Relation Age of Onset   Arthritis Father    Birth defects Sister        spina bifida   Asthma Brother    Diabetes Maternal Uncle    Thyroid disease Mother    Other Neg Hx    Alcohol abuse Neg Hx    Hypertension Neg Hx    Hyperlipidemia Neg Hx    Heart disease Neg Hx    Hearing loss Neg Hx    Early death Neg Hx    Drug abuse Neg Hx    Miscarriages / Stillbirths Neg Hx    Mental retardation Neg Hx    Mental illness Neg Hx    Learning disabilities Neg Hx  Kidney disease Neg Hx    Stroke Neg Hx    Vision loss Neg Hx     Social History Social History   Tobacco Use   Smoking status: Former    Packs/day: 0.25    Types: Cigarettes   Smokeless tobacco: Never  Vaping Use   Vaping Use: Never used  Substance Use Topics   Alcohol use: No    Comment: occasional   Drug use: No     Allergies   Patient has no known allergies.   Review of Systems Review of Systems  Constitutional:  Negative for chills and fever.  Eyes:  Negative for discharge and redness.  Gastrointestinal:  Negative for abdominal pain, nausea and vomiting.  Genitourinary:  Positive for vaginal discharge. Negative for genital sores.  Skin:  Positive for rash.     Physical Exam Triage Vital Signs ED Triage Vitals  Enc Vitals Group     BP 07/26/22 0834 131/76     Pulse Rate 07/26/22 0834 66     Resp 07/26/22 0834 18     Temp 07/26/22 0834 98.1 F (36.7 C)     Temp Source 07/26/22 0834 Oral     SpO2 07/26/22 0834 98 %     Weight --      Height --      Head Circumference --      Peak Flow --      Pain Score 07/26/22 0918 0     Pain Loc --      Pain Edu? --      Excl.  in GC? --    No data found.  Updated Vital Signs BP 131/76 (BP Location: Left Arm)   Pulse 66   Temp 98.1 F (36.7 C) (Oral)   Resp 18   SpO2 98%   Physical Exam Vitals and nursing note reviewed.  Constitutional:      General: She is not in acute distress.    Appearance: Normal appearance. She is not ill-appearing.  HENT:     Head: Normocephalic and atraumatic.  Eyes:     Conjunctiva/sclera: Conjunctivae normal.  Cardiovascular:     Rate and Rhythm: Normal rate.  Pulmonary:     Effort: Pulmonary effort is normal.  Genitourinary:    General: Normal vulva.     Vagina: Vaginal discharge ([place] thick discharge noted) present.  Skin:    Comments: Mildly erythematous, dry flaking rash in malar distribution and to central forehead  Neurological:     Mental Status: She is alert.  Psychiatric:        Mood and Affect: Mood normal.        Behavior: Behavior normal.        Thought Content: Thought content normal.      UC Treatments / Results  Labs (all labs ordered are listed, but only abnormal results are displayed) Labs Reviewed  HIV ANTIBODY (ROUTINE TESTING W REFLEX)  HEPATITIS PANEL, ACUTE  HSV 2 ANTIBODY, IGG  RPR  ANTINUCLEAR ANTIBODIES, IFA  CERVICOVAGINAL ANCILLARY ONLY    EKG   Radiology No results found.  Procedures Procedures (including critical care time)  Medications Ordered in UC Medications - No data to display  Initial Impression / Assessment and Plan / UC Course  I have reviewed the triage vital signs and the nursing notes.  Pertinent labs & imaging results that were available during my care of the patient were reviewed by me and considered in my medical decision making (see chart for details).  STD screening ordered.  No lesions noted on exam for viral swab for HSV however will order blood screening for antibodies given concerns for exposure.  Will await results for further recommendation but did prescribe Diflucan to cover yeast given  appearance of discharge history of same.  Discussed rash on face could be related to eczema however also discussed alternative diagnosis of possible lupus given distribution of rash.  Will order ANA screening and steroid prescription prescribed.  Recommended she combine steroid prescription with unscented emollient such as Eucerin or CeraVe and use no longer than 2 weeks.  Patient did set up appointment with primary care to establish care while in office early next week.  Final Clinical Impressions(s) / UC Diagnoses   Final diagnoses:  Vaginal discharge  Screening for STD (sexually transmitted disease)  Dermatitis of face   Discharge Instructions   None    ED Prescriptions     Medication Sig Dispense Auth. Provider   fluconazole (DIFLUCAN) 150 MG tablet Take one tab PO today and repeat dose in 3 days if symptoms persist 2 tablet Erma Pinto F, PA-C   triamcinolone cream (KENALOG) 0.1 % Apply 1 Application topically 2 (two) times daily. 30 g Tomi Bamberger, PA-C      PDMP not reviewed this encounter.   Tomi Bamberger, PA-C 07/26/22 1120

## 2022-07-29 LAB — CERVICOVAGINAL ANCILLARY ONLY
Bacterial Vaginitis (gardnerella): NEGATIVE
Candida Glabrata: NEGATIVE
Candida Vaginitis: NEGATIVE
Chlamydia: NEGATIVE
Comment: NEGATIVE
Comment: NEGATIVE
Comment: NEGATIVE
Comment: NEGATIVE
Comment: NEGATIVE
Comment: NORMAL
Neisseria Gonorrhea: NEGATIVE
Trichomonas: NEGATIVE

## 2022-07-30 LAB — HSV-2 AB, IGG: HSV 2 IgG, Type Spec: 16.4 index — ABNORMAL HIGH (ref 0.00–0.90)

## 2022-07-30 LAB — RPR, QUANT+TP ABS (REFLEX)
Rapid Plasma Reagin, Quant: 1:64 {titer} — ABNORMAL HIGH
T Pallidum Abs: REACTIVE — AB

## 2022-07-30 LAB — RPR: RPR Ser Ql: REACTIVE — AB

## 2022-07-30 LAB — ANTINUCLEAR ANTIBODIES, IFA: ANA Titer 1: NEGATIVE

## 2022-07-30 LAB — HIV ANTIBODY (ROUTINE TESTING W REFLEX): HIV Screen 4th Generation wRfx: NONREACTIVE

## 2022-07-31 ENCOUNTER — Ambulatory Visit
Admission: EM | Admit: 2022-07-31 | Discharge: 2022-07-31 | Disposition: A | Discharge status: Home / Self Care | Payer: Medicaid Other | Attending: Internal Medicine | Admitting: Internal Medicine

## 2022-07-31 ENCOUNTER — Ambulatory Visit: Payer: Medicaid Other | Admitting: Family Medicine

## 2022-07-31 ENCOUNTER — Telehealth: Payer: Self-pay | Admitting: Physician Assistant

## 2022-07-31 DIAGNOSIS — A539 Syphilis, unspecified: Secondary | ICD-10-CM | POA: Diagnosis not present

## 2022-07-31 MED ORDER — VALACYCLOVIR HCL 1 G PO TABS: 1000.0000 mg | ORAL_TABLET | Freq: Three times a day (TID) | ORAL | 0 refills | Status: DC

## 2022-07-31 MED ORDER — PENICILLIN G BENZATHINE 1200000 UNIT/2ML IM SUSY
2.4000 10*6.[IU] | PREFILLED_SYRINGE | Freq: Once | INTRAMUSCULAR | Status: AC
  Administered 2022-07-31: 2.4 10*6.[IU] via INTRAMUSCULAR

## 2022-07-31 NOTE — ED Triage Notes (Signed)
Pt presents for follow up visit for treatment for positive RPR.

## 2022-07-31 NOTE — Telephone Encounter (Signed)
Patient called office after reviewing results online- requested call back.  She had questions regarding treatment.  Notified patient she would need treatment for syphilis and advised her to either return to urgent care as walk-in or make appointment for injection.  Patient also positive for HSV-2 antibodies, discussed same with patient at time of appointment and will send in treatment of Valtrex.  Encouraged further evaluation with any further concerns or questions.

## 2022-08-06 ENCOUNTER — Telehealth: Payer: Medicaid Other

## 2022-08-10 ENCOUNTER — Telehealth: Payer: Medicaid Other | Admitting: Nurse Practitioner

## 2022-08-10 DIAGNOSIS — B3731 Acute candidiasis of vulva and vagina: Secondary | ICD-10-CM | POA: Diagnosis not present

## 2022-08-10 MED ORDER — FLUCONAZOLE 150 MG PO TABS: ORAL_TABLET | ORAL | 0 refills | Status: DC

## 2022-08-10 NOTE — Progress Notes (Signed)
I have spent 5 minutes in review of e-visit questionnaire, review and updating patient chart, medical decision making and response to patient.  ° °Erin Ricardo W Leliana Kontz, NP ° °  °

## 2022-08-10 NOTE — Progress Notes (Signed)

## 2022-09-02 ENCOUNTER — Other Ambulatory Visit: Payer: Self-pay | Admitting: Nurse Practitioner

## 2022-09-02 DIAGNOSIS — B3731 Acute candidiasis of vulva and vagina: Secondary | ICD-10-CM

## 2022-09-03 ENCOUNTER — Other Ambulatory Visit: Payer: Self-pay | Admitting: Nurse Practitioner

## 2022-09-03 DIAGNOSIS — B3731 Acute candidiasis of vulva and vagina: Secondary | ICD-10-CM

## 2022-09-30 NOTE — L&D Delivery Note (Signed)
Delivery Note Pt delivered en route to hospital in ambulance. Upon arrival to MAU infant was crying, vigorous. Placenta was not yet delivered. Bleeding appeared appropriate. Difficult to quantify due to being mixed with amniotic fluid.   According to EMS, at 11:57 AM a viable and healthy female was delivered via VBAC, Spontaneous (Presentation:   Occiput Anterior).  APGAR: Not calculated; weight 6 lb 0.7 oz (2740 g).   Placenta status: Spontaneous, Intact.  Cord: 3 vessels with the following complications: None.  Cord pH: NA  Anesthesia: None Episiotomy: None Lacerations: None Suture Repair:  NA Est. Blood Loss (mL): WNL. 50 ml additional bleeding with delivery of placenta.   Mom to postpartum.  Baby to Couplet care / Skin to Skin. Placenta to: LD Feeding: Breast and bottle  Circ: IP Contraception: PP Nexplanon   [place] 06/12/2023, 4:21 PM

## 2022-11-15 ENCOUNTER — Encounter: Payer: Self-pay | Admitting: Student

## 2022-11-15 ENCOUNTER — Inpatient Hospital Stay (HOSPITAL_COMMUNITY)
Admission: AD | Admit: 2022-11-15 | Discharge: 2022-11-15 | Disposition: A | Discharge status: Home / Self Care | Payer: Medicaid Other | Attending: Obstetrics and Gynecology | Admitting: Obstetrics and Gynecology

## 2022-11-15 ENCOUNTER — Inpatient Hospital Stay (HOSPITAL_COMMUNITY): Payer: Medicaid Other

## 2022-11-15 DIAGNOSIS — J101 Influenza due to other identified influenza virus with other respiratory manifestations: Secondary | ICD-10-CM | POA: Diagnosis not present

## 2022-11-15 DIAGNOSIS — O26891 Other specified pregnancy related conditions, first trimester: Secondary | ICD-10-CM | POA: Diagnosis not present

## 2022-11-15 DIAGNOSIS — O99511 Diseases of the respiratory system complicating pregnancy, first trimester: Secondary | ICD-10-CM | POA: Diagnosis not present

## 2022-11-15 DIAGNOSIS — O219 Vomiting of pregnancy, unspecified: Secondary | ICD-10-CM

## 2022-11-15 DIAGNOSIS — Z3A01 Less than 8 weeks gestation of pregnancy: Secondary | ICD-10-CM

## 2022-11-15 DIAGNOSIS — O208 Other hemorrhage in early pregnancy: Secondary | ICD-10-CM | POA: Diagnosis not present

## 2022-11-15 DIAGNOSIS — Z3A08 8 weeks gestation of pregnancy: Secondary | ICD-10-CM | POA: Insufficient documentation

## 2022-11-15 DIAGNOSIS — Z1152 Encounter for screening for COVID-19: Secondary | ICD-10-CM | POA: Insufficient documentation

## 2022-11-15 DIAGNOSIS — R109 Unspecified abdominal pain: Secondary | ICD-10-CM | POA: Diagnosis present

## 2022-11-15 LAB — RESP PANEL BY RT-PCR (RSV, FLU A&B, COVID)  RVPGX2
Influenza A by PCR: NEGATIVE
Influenza B by PCR: POSITIVE — AB
Resp Syncytial Virus by PCR: NEGATIVE
SARS Coronavirus 2 by RT PCR: NEGATIVE

## 2022-11-15 LAB — CBC
HCT: 41.6 % (ref 36.0–46.0)
Hemoglobin: 14.4 g/dL (ref 12.0–15.0)
MCH: 30.8 pg (ref 26.0–34.0)
MCHC: 34.6 g/dL (ref 30.0–36.0)
MCV: 88.9 fL (ref 80.0–100.0)
Platelets: 245 10*3/uL (ref 150–400)
RBC: 4.68 MIL/uL (ref 3.87–5.11)
RDW: 13.5 % (ref 11.5–15.5)
WBC: 10.1 10*3/uL (ref 4.0–10.5)
nRBC: 0 % (ref 0.0–0.2)

## 2022-11-15 LAB — URINALYSIS, ROUTINE W REFLEX MICROSCOPIC
Bilirubin Urine: NEGATIVE
Glucose, UA: NEGATIVE mg/dL
Hgb urine dipstick: NEGATIVE
Ketones, ur: 80 mg/dL — AB
Nitrite: NEGATIVE
Protein, ur: 30 mg/dL — AB
Specific Gravity, Urine: 1.021 (ref 1.005–1.030)
pH: 6 (ref 5.0–8.0)

## 2022-11-15 LAB — WET PREP, GENITAL
Clue Cells Wet Prep HPF POC: NONE SEEN
Sperm: NONE SEEN
Trich, Wet Prep: NONE SEEN
WBC, Wet Prep HPF POC: <10 (ref <10)
Yeast Wet Prep HPF POC: NONE SEEN

## 2022-11-15 LAB — HCG, QUANTITATIVE, PREGNANCY: hCG, Beta Chain, Quant, S: 228112 m[IU]/mL — ABNORMAL HIGH (ref <5)

## 2022-11-15 MED ORDER — ACETAMINOPHEN-CAFFEINE 500-65 MG PO TABS
2.0000 | ORAL_TABLET | Freq: Once | ORAL | Status: AC
  Administered 2022-11-15: 2 via ORAL
  Filled 2022-11-15: qty 2

## 2022-11-15 MED ORDER — BENZONATATE 100 MG PO CAPS
200.0000 mg | ORAL_CAPSULE | Freq: Once | ORAL | Status: AC
  Administered 2022-11-15: 200 mg via ORAL
  Filled 2022-11-15: qty 2

## 2022-11-15 MED ORDER — METOCLOPRAMIDE HCL 5 MG/ML IJ SOLN
10.0000 mg | Freq: Once | INTRAMUSCULAR | Status: AC
  Administered 2022-11-15: 10 mg via INTRAVENOUS
  Filled 2022-11-15: qty 2

## 2022-11-15 MED ORDER — LACTATED RINGERS IV BOLUS
1000.0000 mL | Freq: Once | INTRAVENOUS | Status: AC
  Administered 2022-11-15: 1000 mL via INTRAVENOUS

## 2022-11-15 MED ORDER — METOCLOPRAMIDE HCL 10 MG PO TABS: 10.0000 mg | ORAL_TABLET | Freq: Three times a day (TID) | ORAL | 2 refills | Status: DC | PRN

## 2022-11-15 MED ORDER — FAMOTIDINE IN NACL 20-0.9 MG/50ML-% IV SOLN
20.0000 mg | Freq: Once | INTRAVENOUS | Status: AC
  Administered 2022-11-15: 20 mg via INTRAVENOUS
  Filled 2022-11-15: qty 50

## 2022-11-15 MED ORDER — OSELTAMIVIR PHOSPHATE 75 MG PO CAPS: 75.0000 mg | ORAL_CAPSULE | Freq: Two times a day (BID) | ORAL | 0 refills | Status: AC

## 2022-11-15 NOTE — MAU Provider Note (Signed)
History     [id number]  Arrival date and time: 11/15/22 1412    Chief Complaint  Patient presents with   Abdominal Pain   Headache   Generalized Body Aches   Cough     HPI SERA TERRANOVA is a 32 y.o. at 73w5dwho presents for n/v, headache, body aches, & abdominal pain. Symptoms started last night. Hasn't taken anything for her symptoms. No sick contacts. Hasn't checked temp at home.  Reports cramping throughout lower abdomen, worse on right side. Has vomited several times today & reports that she can't keep anything down. Has had body aches & chills. Non productive cough. Headache worse with light.  Denies SOB, CP, dysuria, vaginal bleeding, vaginal discharge, dysuria, or diarrhea.   OB History     Gravida  3   Para  2   Term  2   Preterm  0   AB  0   Living  2      SAB  0   IAB  0   Ectopic  0   Multiple  0   Live Births  2        Obstetric Comments  Delivery note attached to prenatal record indicates 2014 low transverse c/s          Past Medical History:  Diagnosis Date   Chlamydia    Depression    Previously treated with Risperdal   Hypothyroidism     Past Surgical History:  Procedure Laterality Date   CESAREAN SECTION N/A 01/15/2013   Procedure: CESAREAN SECTION;  Surgeon: BFrederico Hamman MD;  [facility];  Service: Obstetrics;  Laterality: N/A;   NOSE SURGERY     WISDOM TOOTH EXTRACTION      Family History  Problem Relation Age of Onset   Arthritis Father    Birth defects Sister        spina bifida   Asthma Brother    Diabetes Maternal Uncle    Thyroid disease Mother    Other Neg Hx    Alcohol abuse Neg Hx    Hypertension Neg Hx    Hyperlipidemia Neg Hx    Heart disease Neg Hx    Hearing loss Neg Hx    Early death Neg Hx    Drug abuse Neg Hx    Miscarriages / Stillbirths Neg Hx    Mental retardation Neg Hx    Mental illness Neg Hx    Learning disabilities Neg Hx    Kidney disease Neg Hx    Stroke Neg Hx     Vision loss Neg Hx     No Known Allergies  No current facility-administered medications on file prior to encounter.   Current Outpatient Medications on File Prior to Encounter  Medication Sig Dispense Refill   valACYclovir (VALTREX) 1000 MG tablet Take 1 tablet (1,000 mg total) by mouth 3 (three) times daily. 21 tablet 0     ROS Pertinent positives and negative per HPI, all others reviewed and negative  Physical Exam   BP (!) 111/52   Pulse 65   Temp 98.5 F (36.9 C) (Oral)   Resp 17   Ht 5' 2"$  (1.575 m)   Wt 59.3 kg   LMP 09/22/2022   SpO2 100%   BMI 23.91 kg/m   Patient Vitals for the past 24 hrs:  BP Temp Temp src Pulse Resp SpO2 Height Weight  11/15/22 1806 (!) 111/52 -- -- 65 -- -- -- --  11/15/22 1453 (!) 116/55 -- -- -- -- -- -- --  11/15/22 1437 (!) 108/54 98.5 F (36.9 C) Oral 60 17 100 % 5' 2"$  (1.575 m) 59.3 kg  11/15/22 1435 -- -- -- -- -- 100 % -- --    Physical Exam Vitals and nursing note reviewed.  Constitutional:      Appearance: She is well-developed. She is ill-appearing. She is not toxic-appearing.  HENT:     Head: Normocephalic and atraumatic.  Cardiovascular:     Rate and Rhythm: Normal rate and regular rhythm.  Pulmonary:     Effort: Pulmonary effort is normal. No respiratory distress.  Abdominal:     General: Abdomen is flat.     Palpations: Abdomen is soft.     Tenderness: There is no abdominal tenderness. There is no guarding or rebound.  Skin:    General: Skin is warm and dry.  Neurological:     Mental Status: She is alert.       Labs Results for orders placed or performed during the hospital encounter of 11/15/22 (from the past 24 hour(s))  Urinalysis, Routine w reflex microscopic -Urine, Clean Catch     Status: Abnormal   Collection Time: 11/15/22  3:05 PM  Result Value Ref Range   Color, Urine YELLOW YELLOW   APPearance HAZY (A) CLEAR   Specific Gravity, Urine 1.021 1.005 - 1.030   pH 6.0 5.0 - 8.0   Glucose, UA  NEGATIVE NEGATIVE mg/dL   Hgb urine dipstick NEGATIVE NEGATIVE   Bilirubin Urine NEGATIVE NEGATIVE   Ketones, ur 80 (A) NEGATIVE mg/dL   Protein, ur 30 (A) NEGATIVE mg/dL   Nitrite NEGATIVE NEGATIVE   Leukocytes,Ua TRACE (A) NEGATIVE   RBC / HPF 0-5 0 - 5 RBC/hpf   WBC, UA 0-5 0 - 5 WBC/hpf   Bacteria, UA FEW (A) NONE SEEN   Squamous Epithelial / HPF 0-5 0 - 5 /HPF   Mucus PRESENT   Resp panel by RT-PCR (RSV, Flu A&B, Covid) Anterior Nasal Swab     Status: Abnormal   Collection Time: 11/15/22  3:23 PM   Specimen: Anterior Nasal Swab  Result Value Ref Range   SARS Coronavirus 2 by RT PCR NEGATIVE NEGATIVE   Influenza A by PCR NEGATIVE NEGATIVE   Influenza B by PCR POSITIVE (A) NEGATIVE   Resp Syncytial Virus by PCR NEGATIVE NEGATIVE  CBC     Status: None   Collection Time: 11/15/22  3:56 PM  Result Value Ref Range   WBC 10.1 4.0 - 10.5 K/uL   RBC 4.68 3.87 - 5.11 MIL/uL   Hemoglobin 14.4 12.0 - 15.0 g/dL   HCT 41.6 36.0 - 46.0 %   MCV 88.9 80.0 - 100.0 fL   MCH 30.8 26.0 - 34.0 pg   MCHC 34.6 30.0 - 36.0 g/dL   RDW 13.5 11.5 - 15.5 %   Platelets 245 150 - 400 K/uL   nRBC 0.0 0.0 - 0.2 %  hCG, quantitative, pregnancy     Status: Abnormal   Collection Time: 11/15/22  3:56 PM  Result Value Ref Range   hCG, Beta Chain, Quant, S 228,112 (H) <5 mIU/mL  Wet prep, genital     Status: None   Collection Time: 11/15/22  4:24 PM   Specimen: PATH Cytology Cervicovaginal Ancillary Only  Result Value Ref Range   Yeast Wet Prep HPF POC NONE SEEN NONE SEEN   Trich, Wet Prep NONE SEEN NONE SEEN   Clue Cells Wet Prep HPF POC NONE SEEN NONE SEEN   WBC, Wet Prep  HPF POC <10 <10   Sperm NONE SEEN     Imaging US OB Comp Less 14 Wks  Result Date: 11/15/2022 CLINICAL DATA:  Pregnant, abdominal pain EXAM: OBSTETRIC <14 WK ULTRASOUND TECHNIQUE: Transabdominal ultrasound was performed for evaluation of the gestation as well as the maternal uterus and adnexal regions. COMPARISON:  None  Available. FINDINGS: Intrauterine gestational sac: Single Yolk sac:  Visualized. Embryo:  Visualized. Cardiac Activity: Visualized. Heart Rate: 165 bpm CRL:   16.1 mm   8 w 0 d                  [place] EDC: 06/27/2023 Subchorionic hemorrhage: Small subchorionic hemorrhage along the posterior aspect of the gestational sac measuring 0.9 x 0.4 x 0.4 cm. Maternal uterus/adnexae: There are no adnexal masses. No free fluid. IMPRESSION: 1. Single live intrauterine pregnancy as above, estimated age 78 weeks and 0 days. 2. Small subchorionic hemorrhage. Electronically Signed   By: Randa Ngo M.D.   On: 11/15/2022 17:35    MAU Course  Procedures Lab Orders         Resp panel by RT-PCR (RSV, Flu A&B, Covid) Anterior Nasal Swab         Wet prep, genital         Urinalysis, Routine w reflex microscopic -Urine, Clean Catch         CBC         hCG, quantitative, pregnancy    Meds ordered this encounter  Medications   lactated ringers bolus 1,000 mL   metoCLOPramide (REGLAN) injection 10 mg   famotidine (PEPCID) IVPB 20 mg premix   benzonatate (TESSALON) capsule 200 mg   acetaminophen-caffeine (EXCEDRIN TENSION HEADACHE) 500-65 MG per tablet 2 tablet   oseltamivir (TAMIFLU) 75 MG capsule    Sig: Take 1 capsule (75 mg total) by mouth 2 (two) times daily for 5 days.    Dispense:  10 capsule    Refill:  0    Order Specific Question:   Supervising Provider    Answer:   Inez Catalina [id number]   metoCLOPramide (REGLAN) 10 MG tablet    Sig: Take 1 tablet (10 mg total) by mouth every 8 (eight) hours as needed for nausea or vomiting.    Dispense:  30 tablet    Refill:  2    Order Specific Question:   Supervising Provider    Answer:   Inez Catalina [id number]   Imaging Orders         US OB Comp Less 73 Wks      MDM Ultrasound shows live IUP & small subchorionic hemorrhage.   Resp panel positive for flu B. Will tx with tamiflu & write out of work until next week  Treatment in MAU includes IV  fluids, reglan, tessalon, pepcid, & excedrin tension. Patient reports marked improvement in symptoms & ready for discharge home.  Assessment and Plan   1. Influenza B  -Rx tamiflu -Discussed symptomatic treatment -Out of work until Tuesday if symptoms improved  2. Nausea and vomiting during pregnancy prior to [redacted] weeks gestation  -Rx reglan  3. [redacted] weeks gestation of pregnancy  -Keep scheduled OB appointments -Reviewed reasons to return to [place], NP 11/15/22 6:24 PM

## 2022-11-15 NOTE — MAU Note (Signed)
Erin Bell is a 32 y.o. at Unknown here in MAU reporting: woke up with this cough, her head hurts, her body hurts, her stomach hurts.  Can't keep nothing down.  Doesn't have an appetite anyway.  At time feels like she is  burning up, then freezing, did not check temp. Is pregnant.  No bleeding LMP: 12/24 Onset of complaint: this morning Pain score: generalized 6, head 8,stomach 8 Vitals:   11/15/22 1437  BP: (!) 108/54  Pulse: 100  Resp: 17  Temp: 98.5 F (36.9 C)  SpO2: 100%      Lab orders placed from triage:  UA

## 2022-11-18 LAB — GC/CHLAMYDIA PROBE AMP (~~LOC~~) NOT AT ARMC
Chlamydia: NEGATIVE
Comment: NEGATIVE
Comment: NORMAL
Neisseria Gonorrhea: NEGATIVE

## 2022-11-28 ENCOUNTER — Encounter: Payer: Self-pay | Admitting: Medical

## 2022-11-28 ENCOUNTER — Telehealth (INDEPENDENT_AMBULATORY_CARE_PROVIDER_SITE_OTHER): Payer: Medicaid Other

## 2022-11-28 DIAGNOSIS — Z3689 Encounter for other specified antenatal screening: Secondary | ICD-10-CM

## 2022-11-28 DIAGNOSIS — Z3480 Encounter for supervision of other normal pregnancy, unspecified trimester: Secondary | ICD-10-CM | POA: Insufficient documentation

## 2022-11-28 MED ORDER — PRENATAL PLUS 27-1 MG PO TABS: 1.0000 | ORAL_TABLET | Freq: Every day | ORAL | 11 refills | Status: DC

## 2022-11-28 MED ORDER — BLOOD PRESSURE MONITORING DEVI: 1.0000 | 0 refills | Status: DC

## 2022-11-28 NOTE — Progress Notes (Signed)
New OB Intake  I connected with Erin Bell  on [id number] at  3:15 PM EST by MyChart Video Visit and verified that I am speaking with the correct person using two identifiers. Nurse is located at [facility] and pt is located at home.  I discussed the limitations, risks, security and privacy concerns of performing an evaluation and management service by telephone and the availability of in person appointments. I also discussed with the patient that there may be a patient responsible charge related to this service. The patient expressed understanding and agreed to proceed.  I explained I am completing New OB Intake today. We discussed EDD of 06/29/23 that is based on LMP of 09/23/23. Pt is G3/P2. I reviewed her allergies, medications, Medical/Surgical/OB history, and appropriate screenings. I informed her of [facility] services. [facility] information placed in AVS. Based on history, this is a low risk pregnancy.  Patient Active Problem List   Diagnosis Date Noted   History of cesarean delivery 05/17/2017    Concerns addressed today  Delivery Plans Plans to deliver at [facility]. Patient given information for [facility] Healthy Baby website for more information about Women's and [place]. Patient is not interested in water birth. Offered upcoming OB visit with CNM to discuss further.  MyChart/Babyscripts MyChart access verified. I explained pt will have some visits in office and some virtually. Babyscripts instructions given and order placed. Patient verifies receipt of registration text/e-mail. Account successfully created and app downloaded.  Blood Pressure Cuff/Weight Scale Blood pressure cuff ordered for patient to pick-up from [employer]. Explained after first prenatal appt pt will check weekly and document in 54. Patient does have weight scale.  Anatomy US Explained first scheduled [place] will be around 19 weeks. Anatomy US scheduled for 02/03/23 at 0145p. Pt notified to arrive at  0130p.  Labs Discussed [name] genetic screening with patient. Would like both Panorama and Horizon drawn at new OB visit. Routine prenatal labs needed.  COVID Vaccine Patient has not had COVID vaccine.   Is patient a CenteringPregnancy candidate?  Accepted   Is patient a Mom+Baby Combined Care candidate?  Not a candidate    Social Determinants of Health Food Insecurity: Patient denies food insecurity. WIC Referral: Patient is interested in referral to [facility].  Transportation: Patient denies transportation needs. Childcare: Discussed no children allowed at ultrasound appointments. Offered childcare services; patient declines childcare services at this time.  Interested in [place]? If yes, send referral.   First visit review I reviewed new OB appt with patient. Explained pt will be seen by Hamilton Capri at first visit; encounter routed to appropriate provider. Explained that patient will be seen by pregnancy navigator following visit with provider.   [name], [place] 11/28/2022  3:14 PM

## 2022-12-16 ENCOUNTER — Other Ambulatory Visit (HOSPITAL_COMMUNITY)
Admission: RE | Admit: 2022-12-16 | Discharge: 2022-12-16 | Disposition: A | Discharge status: Home / Self Care | Payer: Medicaid Other | Source: Ambulatory Visit | Attending: Medical | Admitting: Medical

## 2022-12-16 ENCOUNTER — Ambulatory Visit (INDEPENDENT_AMBULATORY_CARE_PROVIDER_SITE_OTHER): Payer: Medicaid Other | Admitting: Obstetrics & Gynecology

## 2022-12-16 ENCOUNTER — Encounter: Payer: Self-pay | Admitting: Obstetrics & Gynecology

## 2022-12-16 ENCOUNTER — Other Ambulatory Visit: Payer: Self-pay

## 2022-12-16 VITALS — BP 121/70 | HR 72 | Wt 136.6 lb

## 2022-12-16 DIAGNOSIS — E039 Hypothyroidism, unspecified: Secondary | ICD-10-CM

## 2022-12-16 DIAGNOSIS — Z3A12 12 weeks gestation of pregnancy: Secondary | ICD-10-CM

## 2022-12-16 DIAGNOSIS — Z3490 Encounter for supervision of normal pregnancy, unspecified, unspecified trimester: Secondary | ICD-10-CM | POA: Diagnosis present

## 2022-12-16 DIAGNOSIS — O98111 Syphilis complicating pregnancy, first trimester: Secondary | ICD-10-CM

## 2022-12-16 DIAGNOSIS — O99281 Endocrine, nutritional and metabolic diseases complicating pregnancy, first trimester: Secondary | ICD-10-CM

## 2022-12-16 DIAGNOSIS — Z98891 History of uterine scar from previous surgery: Secondary | ICD-10-CM

## 2022-12-16 DIAGNOSIS — Z3480 Encounter for supervision of other normal pregnancy, unspecified trimester: Secondary | ICD-10-CM

## 2022-12-16 DIAGNOSIS — O98119 Syphilis complicating pregnancy, unspecified trimester: Secondary | ICD-10-CM

## 2022-12-16 NOTE — Progress Notes (Signed)
Subjective:    Erin Bell is a [id number] [redacted]w[redacted]d being seen today for her first obstetrical visit.  Her obstetrical history is significant for  previous c-section . Patient  does  intend to breast feed and bottle feed. Pregnancy history fully reviewed.  Patient reports no complaints.  Vitals:   12/16/22 1547  BP: 121/70  Pulse: 72  Weight: 136 lb 9.6 oz (62 kg)    HISTORY: OB History  Gravida Para Term Preterm AB Living  3 2 2  0 0 2  SAB IAB Ectopic Multiple Live Births  0 0 0 0 2    # Outcome Date GA Lbr Len/2nd Weight Sex Delivery Anes PTL Lv  3 Current           2 Term 05/17/17 [redacted]w[redacted]d 02:05 / 00:26 6 lb 1.7 oz (2.77 kg) M VBAC EPI  LIV     Birth Comments: WNL  1 Term 01/15/13 [redacted]w[redacted]d 22:30 / 00:51 6 lb 0.8 oz (2.745 kg) F CS-LVertical Spinal  LIV    Obstetric Comments  Delivery note attached to prenatal record indicates 2014 low transverse c/s    Past Medical History:  Diagnosis Date   Chlamydia    Depression    Previously treated with Risperdal   Hypothyroidism    Past Surgical History:  Procedure Laterality Date   CESAREAN SECTION N/A 01/15/2013   Procedure: CESAREAN SECTION;  Surgeon: Frederico Hamman, MD;  Location: [facility];  Service: Obstetrics;  Laterality: N/A;   NOSE SURGERY     WISDOM TOOTH EXTRACTION     Family History  Problem Relation Age of Onset   Arthritis Father    Birth defects Sister        spina bifida   Asthma Brother    Diabetes Maternal Uncle    Thyroid disease Mother    Other Neg Hx    Alcohol abuse Neg Hx    Hypertension Neg Hx    Hyperlipidemia Neg Hx    Heart disease Neg Hx    Hearing loss Neg Hx    Early death Neg Hx    Drug abuse Neg Hx    Miscarriages / Stillbirths Neg Hx    Mental retardation Neg Hx    Mental illness Neg Hx    Learning disabilities Neg Hx    Kidney disease Neg Hx    Stroke Neg Hx    Vision loss Neg Hx      Exam    Uterus:     Pelvic Exam:    Perineum: No Hemorrhoids   Vulva: normal   Vagina:   normal mucosa   pH:    Cervix: no lesions   Adnexa:    Bony Pelvis:   System: Breast:     Skin: normal coloration and turgor, no rashes    Neurologic: oriented, normal mood   Extremities: normal strength, tone, and muscle mass   HEENT PERRLA and neck supple with midline trachea   Mouth/Teeth dental hygiene good   Neck supple   Cardiovascular: regular rate and rhythm, no murmurs or gallops   Respiratory:  appears well, vitals normal, no respiratory distress, acyanotic, normal RR, chest clear, no wheezing   Abdomen: Soft, nontender   Urinary: urethral meatus normal      Assessment:    Pregnancy: [id number] Patient Active Problem List   Diagnosis Date Noted   Supervision of other normal pregnancy, antepartum 11/28/2022   History of cesarean delivery 05/17/2017   Hypothyroid in pregnancy, antepartum, third trimester  03/18/2014        Plan:     Initial labs drawn. Prenatal vitamins. Problem list reviewed and updated. Genetic Screening discussed : ordered.  Ultrasound discussed; fetal survey: ordered.  Follow up in 4 weeks. 50% of 30 min visit spent on counseling and coordination of care.     Cato Mulligan 12/16/2022 Attestation of Attending Supervision of PA Student: Evaluation and management procedures were performed by the PA student under my supervision and collaboration.  I have reviewed the student's note and chart, and I agree with the management and plan.  Emeterio Reeve, MD, [place] Attending [place], [facility]

## 2022-12-17 LAB — CBC/D/PLT+RPR+RH+ABO+RUBIGG...
Antibody Screen: NEGATIVE
Basophils Absolute: 0 10*3/uL (ref 0.0–0.2)
Basos: 0 %
EOS (ABSOLUTE): 0.1 10*3/uL (ref 0.0–0.4)
Eos: 1 %
HCV Ab: NONREACTIVE
HIV Screen 4th Generation wRfx: NONREACTIVE
Hematocrit: 41.2 % (ref 34.0–46.6)
Hemoglobin: 14.3 g/dL (ref 11.1–15.9)
Hepatitis B Surface Ag: NEGATIVE
Immature Grans (Abs): 0.1 10*3/uL (ref 0.0–0.1)
Immature Granulocytes: 1 %
Lymphocytes Absolute: 2.1 10*3/uL (ref 0.7–3.1)
Lymphs: 18 %
MCH: 30.8 pg (ref 26.6–33.0)
MCHC: 34.7 g/dL (ref 31.5–35.7)
MCV: 89 fL (ref 79–97)
Monocytes Absolute: 0.7 10*3/uL (ref 0.1–0.9)
Monocytes: 6 %
Neutrophils Absolute: 8.7 10*3/uL — ABNORMAL HIGH (ref 1.4–7.0)
Neutrophils: 74 %
Platelets: 316 10*3/uL (ref 150–450)
RBC: 4.65 x10E6/uL (ref 3.77–5.28)
RDW: 13.4 % (ref 11.7–15.4)
RPR Ser Ql: REACTIVE — AB
Rh Factor: POSITIVE
Rubella Antibodies, IGG: 3 index (ref >0.99)
WBC: 11.8 10*3/uL — ABNORMAL HIGH (ref 3.4–10.8)

## 2022-12-17 LAB — PROTEIN / CREATININE RATIO, URINE
Creatinine, Urine: 195.4 mg/dL
Protein, Ur: 9.1 mg/dL
Protein/Creat Ratio: 47 mg/g creat (ref 0–200)

## 2022-12-17 LAB — RPR, QUANT+TP ABS (REFLEX)
Rapid Plasma Reagin, Quant: 1:4 {titer} — ABNORMAL HIGH
T Pallidum Abs: REACTIVE — AB

## 2022-12-17 LAB — HEMOGLOBIN A1C
Est. average glucose Bld gHb Est-mCnc: 105 mg/dL
Hgb A1c MFr Bld: 5.3 % (ref 4.8–5.6)

## 2022-12-17 LAB — HCV INTERPRETATION

## 2022-12-17 LAB — TSH

## 2022-12-17 LAB — TSH RFX ON ABNORMAL TO FREE T4: TSH: 0.529 u[IU]/mL (ref 0.450–4.500)

## 2022-12-18 LAB — CULTURE, OB URINE

## 2022-12-18 LAB — URINE CULTURE, OB REFLEX: Organism ID, Bacteria: NO GROWTH

## 2022-12-19 LAB — CYTOLOGY - PAP
Chlamydia: NEGATIVE
Comment: NEGATIVE
Comment: NORMAL
Diagnosis: NEGATIVE
Neisseria Gonorrhea: NEGATIVE

## 2022-12-23 DIAGNOSIS — O98119 Syphilis complicating pregnancy, unspecified trimester: Secondary | ICD-10-CM | POA: Insufficient documentation

## 2022-12-23 DIAGNOSIS — A539 Syphilis, unspecified: Secondary | ICD-10-CM | POA: Insufficient documentation

## 2022-12-23 LAB — PANORAMA PRENATAL TEST FULL PANEL:PANORAMA TEST PLUS 5 ADDITIONAL MICRODELETIONS: FETAL FRACTION: 9

## 2022-12-23 LAB — HORIZON CUSTOM: REPORT SUMMARY: NEGATIVE

## 2022-12-23 NOTE — Progress Notes (Signed)
May need treatment for syphilis, her history is unclear, previous positive titer

## 2023-01-06 ENCOUNTER — Ambulatory Visit (INDEPENDENT_AMBULATORY_CARE_PROVIDER_SITE_OTHER): Payer: Medicaid Other | Admitting: Advanced Practice Midwife

## 2023-01-06 VITALS — BP 122/83 | HR 76 | Wt 144.2 lb

## 2023-01-06 DIAGNOSIS — O98112 Syphilis complicating pregnancy, second trimester: Secondary | ICD-10-CM | POA: Diagnosis not present

## 2023-01-06 DIAGNOSIS — Z3480 Encounter for supervision of other normal pregnancy, unspecified trimester: Secondary | ICD-10-CM

## 2023-01-06 DIAGNOSIS — Z98891 History of uterine scar from previous surgery: Secondary | ICD-10-CM

## 2023-01-06 DIAGNOSIS — O99282 Endocrine, nutritional and metabolic diseases complicating pregnancy, second trimester: Secondary | ICD-10-CM

## 2023-01-06 DIAGNOSIS — E039 Hypothyroidism, unspecified: Secondary | ICD-10-CM

## 2023-01-06 DIAGNOSIS — Z3A15 15 weeks gestation of pregnancy: Secondary | ICD-10-CM

## 2023-01-06 DIAGNOSIS — O98119 Syphilis complicating pregnancy, unspecified trimester: Secondary | ICD-10-CM

## 2023-01-06 MED ORDER — PENICILLIN G BENZATHINE 1200000 UNIT/2ML IM SUSY
1.2000 10*6.[IU] | PREFILLED_SYRINGE | Freq: Once | INTRAMUSCULAR | Status: AC
  Administered 2023-01-06: 1.2 10*6.[IU] via INTRAMUSCULAR

## 2023-01-06 NOTE — Progress Notes (Unsigned)
       PRENATAL VISIT NOTE- Centering Pregnancy Cycle {NUMBERS:20191}, Session # {NUMBERS:20191}  Subjective:  Erin Bell is a 32 y.o. G3P2002 at [redacted]w[redacted]d being seen today for ongoing prenatal care through Centering Pregnancy.  She is currently monitored for the following issues for this {Blank single:19197::"high-risk","low-risk"} pregnancy and has Hypothyroid in pregnancy, antepartum, third trimester; History of cesarean delivery; Supervision of other normal pregnancy, antepartum; and Syphilis affecting pregnancy on their problem list.  Patient reports {sx:14538}.   .  .   . ***Denies leaking of fluid/ROM.   The following portions of the patient's history were reviewed and updated as appropriate: allergies, current medications, past family history, past medical history, past social history, past surgical history and problem list. Problem list updated.  Objective:   Vitals:   01/06/23 1203  BP: 122/83  Pulse: 76  Weight: 144 lb 3.2 oz (65.4 kg)    Fetal Status:           General:  Alert, oriented and cooperative. Patient is in no acute distress.  Skin: Skin is warm and dry. No rash noted.   Cardiovascular: Normal heart rate noted  Respiratory: Normal respiratory effort, no problems with respiration noted  Abdomen: Soft, gravid, appropriate for gestational age.        Pelvic: {Blank single:19197::"Cervical exam performed","Cervical exam deferred"}        Extremities: Normal range of motion.     Mental Status: Normal mood and affect. Normal behavior. Normal judgment and thought content.   Assessment and Plan:  Pregnancy: G3P2002 at [redacted]w[redacted]d  1. Supervision of other normal pregnancy, antepartum   Centering Pregnancy, Session#1: Introduction to model of care. Group determined rules for self-governance and closing phrase. Oriented group to space and mother's notebook.   Facilitated discussion today:  common discomforts, When to call practice  Mindfulness activity completed as  well as introduction to deep breathing for childbirth preparation- Centering 3 Breaths  Fundal height and FHR appropriate today unless noted otherwise in plan of care. Patient to continue group care.   2. History of cesarean delivery ***  3. History of VBAC ***  4. [redacted] weeks gestation of pregnancy ***   *** add [facility] dot phrase for session   {Blank single:19197::"Term","Preterm"} labor symptoms and general obstetric precautions including but not limited to vaginal bleeding, contractions, leaking of fluid and fetal movement were reviewed in detail with the patient. Please refer to After Visit Summary for other counseling recommendations.  No follow-ups on file.  Future Appointments  Date Time Provider Department Center  01/13/2023 10:20 AM [facility] NURSE [facility]  01/20/2023 10:00 AM [facility] NURSE [facility]  02/03/2023  9:00 AM CENTERING PROVIDER [facility]  02/03/2023  1:30 PM [facility] NURSE [facility]  02/03/2023  1:45 PM [facility] US5 [facility]  03/03/2023  9:00 AM CENTERING PROVIDER [facility]  03/17/2023  9:00 AM CENTERING PROVIDER [facility]  03/31/2023  9:00 AM CENTERING PROVIDER [facility]  04/14/2023  9:00 AM CENTERING PROVIDER [facility]  04/28/2023  9:00 AM CENTERING PROVIDER [facility]  05/12/2023  9:00 AM CENTERING PROVIDER [facility]  05/26/2023  9:00 AM CENTERING PROVIDER [facility]  06/09/2023  9:00 AM CENTERING PROVIDER [facility]    Sharen Counter, CNM

## 2023-01-08 ENCOUNTER — Encounter: Payer: Self-pay | Admitting: *Deleted

## 2023-01-10 ENCOUNTER — Telehealth: Payer: Self-pay

## 2023-01-10 NOTE — Telephone Encounter (Signed)
Pt returned call and pt informed that her two doses should be sufficient in treatment.  Pt advised that she will get retested with her 28 wks labs.  Pt verbalized understanding with no further questions.  Leonette Nutting

## 2023-01-10 NOTE — Telephone Encounter (Addendum)
-----   Message from Hurshel Party, CNM sent at 01/08/2023  2:55 PM EDT ----- Regarding: Centering pt and RPR Addison Naegeli,  I reviewed our Centering pt's chart with Dr Crissie Reese today and we did a thorough chart review.  Since Erin Bell had a negative RPR 01/30/2022, then was positive 07/26/22, her one dose of PCN in November was likely sufficient since we caught it very early.  But since she is pregnant, another dose is probably beneficial to be sure, but she does not need the other 2 doses.  Her titer came down too, from when it was first positive until her New OB visit which is good.  Could you let her know this week?  Her nurse visit is scheduled 4/15?  Thank you!   Left message for pt that I am calling to let you know that you do not need your appt so if I can please give [place] a call back.  I will also be reaching out to you as well.    Addison Naegeli, RN

## 2023-01-13 ENCOUNTER — Other Ambulatory Visit: Payer: Medicaid Other

## 2023-01-17 ENCOUNTER — Telehealth: Payer: Self-pay | Admitting: General Practice

## 2023-01-17 NOTE — Telephone Encounter (Signed)
Patient called into front office wanting to know if 4/22 appt was necessary as she was told she didn't need to come in. Clarified with Addison Naegeli RN who states 4/22 appt is not needed as she received treatment on 4/8. Discussed with patient. Patient verbalized understanding and states she has been told different things as far as how many treatments are needed. Reviewed with her that because she's had regular testing, she only really needed the one dose however given that she's pregnant an additional dose was given out of precaution. Discussed titers will be drawn around 28 weeks to make sure everything is continuing in the right direction. Patient verbalized understanding.

## 2023-01-20 ENCOUNTER — Ambulatory Visit: Payer: Medicaid Other

## 2023-02-02 NOTE — Progress Notes (Unsigned)
       PRENATAL VISIT NOTE- Centering Pregnancy Cycle {NUMBERS:20191}, Session # {NUMBERS:20191}  Subjective:  Erin Bell is a 32 y.o. G3P2002 at [redacted]w[redacted]d being seen today for ongoing prenatal care through Centering Pregnancy.  She is currently monitored for the following issues for this {Blank single:19197::"high-risk","low-risk"} pregnancy and has Hypothyroid in pregnancy, antepartum, third trimester; History of cesarean delivery; Supervision of other normal pregnancy, antepartum; and Syphilis affecting pregnancy on their problem list.  Patient reports {sx:14538}.   .  .   . ***Denies leaking of fluid/ROM.   The following portions of the patient's history were reviewed and updated as appropriate: allergies, current medications, past family history, past medical history, past social history, past surgical history and problem list. Problem list updated.  Objective:  There were no vitals filed for this visit.  Fetal Status:           General:  Alert, oriented and cooperative. Patient is in no acute distress.  Skin: Skin is warm and dry. No rash noted.   Cardiovascular: Normal heart rate noted  Respiratory: Normal respiratory effort, no problems with respiration noted  Abdomen: Soft, gravid, appropriate for gestational age.        Pelvic: {Blank single:19197::"Cervical exam performed","Cervical exam deferred"}        Extremities: Normal range of motion.     Mental Status: Normal mood and affect. Normal behavior. Normal judgment and thought content.   Assessment and Plan:  Pregnancy: G3P2002 at [redacted]w[redacted]d  There are no diagnoses linked to this encounter.  *** add [facility] dot phrase for session   {Blank single:19197::"Term","Preterm"} labor symptoms and general obstetric precautions including but not limited to vaginal bleeding, contractions, leaking of fluid and fetal movement were reviewed in detail with the patient. Please refer to After Visit Summary for other counseling  recommendations.  No follow-ups on file.  Future Appointments  Date Time Provider Department Center  02/03/2023  9:00 AM CENTERING PROVIDER [facility]  02/03/2023  1:30 PM [facility] NURSE [facility]  02/03/2023  1:45 PM [facility] US5 [facility]  03/03/2023  9:00 AM CENTERING PROVIDER [facility]  03/17/2023  9:00 AM CENTERING PROVIDER [facility]  03/31/2023  9:00 AM CENTERING PROVIDER [facility]  04/14/2023  9:00 AM CENTERING PROVIDER [facility]  04/28/2023  9:00 AM CENTERING PROVIDER [facility]  05/12/2023  9:00 AM CENTERING PROVIDER [facility]  05/26/2023  9:00 AM CENTERING PROVIDER [facility]  06/09/2023  9:00 AM CENTERING PROVIDER [facility]    Sharen Counter, CNM

## 2023-02-03 ENCOUNTER — Ambulatory Visit: Payer: Medicaid Other | Admitting: *Deleted

## 2023-02-03 ENCOUNTER — Ambulatory Visit: Payer: Medicaid Other | Attending: Medical

## 2023-02-03 ENCOUNTER — Encounter: Payer: Self-pay | Admitting: *Deleted

## 2023-02-03 ENCOUNTER — Ambulatory Visit: Payer: Medicaid Other | Admitting: Advanced Practice Midwife

## 2023-02-03 VITALS — BP 114/77 | HR 80 | Wt 150.2 lb

## 2023-02-03 VITALS — BP 113/61 | HR 69

## 2023-02-03 DIAGNOSIS — Z3A19 19 weeks gestation of pregnancy: Secondary | ICD-10-CM | POA: Diagnosis not present

## 2023-02-03 DIAGNOSIS — Z3480 Encounter for supervision of other normal pregnancy, unspecified trimester: Secondary | ICD-10-CM

## 2023-02-03 DIAGNOSIS — Z363 Encounter for antenatal screening for malformations: Secondary | ICD-10-CM | POA: Insufficient documentation

## 2023-02-03 DIAGNOSIS — O99282 Endocrine, nutritional and metabolic diseases complicating pregnancy, second trimester: Secondary | ICD-10-CM | POA: Diagnosis not present

## 2023-02-03 DIAGNOSIS — O321XX0 Maternal care for breech presentation, not applicable or unspecified: Secondary | ICD-10-CM | POA: Insufficient documentation

## 2023-02-03 DIAGNOSIS — R630 Anorexia: Secondary | ICD-10-CM

## 2023-02-03 DIAGNOSIS — E039 Hypothyroidism, unspecified: Secondary | ICD-10-CM | POA: Diagnosis not present

## 2023-02-03 MED ORDER — METOCLOPRAMIDE HCL 10 MG PO TABS
10.0000 mg | ORAL_TABLET | Freq: Three times a day (TID) | ORAL | 2 refills | Status: DC | PRN
Start: 2023-02-03 — End: 2023-04-09

## 2023-02-04 ENCOUNTER — Other Ambulatory Visit: Payer: Self-pay | Admitting: *Deleted

## 2023-02-04 DIAGNOSIS — O99280 Endocrine, nutritional and metabolic diseases complicating pregnancy, unspecified trimester: Secondary | ICD-10-CM

## 2023-02-04 DIAGNOSIS — Z8679 Personal history of other diseases of the circulatory system: Secondary | ICD-10-CM

## 2023-02-04 DIAGNOSIS — Z362 Encounter for other antenatal screening follow-up: Secondary | ICD-10-CM

## 2023-02-05 LAB — AFP, SERUM, OPEN SPINA BIFIDA
AFP MoM: 1.19
AFP Value: 67.5 ng/mL
Gest. Age on Collection Date: 19.1 weeks
Maternal Age At EDD: 30.1 yr
OSBR Risk 1 IN: 10000
Test Results:: NEGATIVE
Weight: 150 [lb_av]

## 2023-02-15 ENCOUNTER — Encounter: Payer: Self-pay | Admitting: Advanced Practice Midwife

## 2023-02-18 ENCOUNTER — Other Ambulatory Visit (HOSPITAL_COMMUNITY)
Admission: RE | Admit: 2023-02-18 | Discharge: 2023-02-18 | Disposition: A | Discharge status: Home / Self Care | Payer: Medicaid Other | Source: Ambulatory Visit | Attending: Family Medicine | Admitting: Family Medicine

## 2023-02-18 ENCOUNTER — Ambulatory Visit (INDEPENDENT_AMBULATORY_CARE_PROVIDER_SITE_OTHER): Payer: Medicaid Other

## 2023-02-18 VITALS — BP 105/67 | HR 58 | Wt 147.9 lb

## 2023-02-18 DIAGNOSIS — N898 Other specified noninflammatory disorders of vagina: Secondary | ICD-10-CM

## 2023-02-18 DIAGNOSIS — R350 Frequency of micturition: Secondary | ICD-10-CM

## 2023-02-18 DIAGNOSIS — Z3480 Encounter for supervision of other normal pregnancy, unspecified trimester: Secondary | ICD-10-CM

## 2023-02-18 DIAGNOSIS — L853 Xerosis cutis: Secondary | ICD-10-CM

## 2023-02-18 LAB — POCT URINALYSIS DIP (DEVICE)
Bilirubin Urine: NEGATIVE
Glucose, UA: NEGATIVE mg/dL
Hgb urine dipstick: NEGATIVE
Ketones, ur: NEGATIVE mg/dL
Leukocytes,Ua: NEGATIVE
Nitrite: NEGATIVE
Protein, ur: NEGATIVE mg/dL
Specific Gravity, Urine: >=1.03 (ref 1.005–1.030)
Urobilinogen, UA: 1 mg/dL (ref 0.0–1.0)
pH: 6.5 (ref 5.0–8.0)

## 2023-02-18 MED ORDER — TRIAMCINOLONE ACETONIDE 0.1 % EX CREA
1.0000 | TOPICAL_CREAM | Freq: Two times a day (BID) | CUTANEOUS | 0 refills | Status: DC
Start: 2023-02-18 — End: 2023-04-25

## 2023-02-18 NOTE — Progress Notes (Signed)
Erin Bell is here with concern of frequent urination and creamy, Lisa vaginal discharge with itching and odor. Currently pregnant at [redacted]w[redacted]d. These symptoms have been present since Friday. Recently began Penicillin for dental problem. Vaginal swab collected by patient. UA not suspicious for UTI. Explained patient will be contacted with any abnormal results.  Patient requests refill of triamcinolone cream for dry, itchy patches on face. This has been intermittently present since delivery of last child in 2018. Pt was told previously this may be eczema and triamcinolone was helpful. Reviewed with Mercado-Ortiz, DO who gives verbal order for refill and recommends follow up in person with provider if any worsening symptoms or if no improvement with 2 week course of triamcinolone. Pt will follow up at Centering Pregnancy visit on 03/03/23.  Marjo Bicker, RN 02/18/2023  10:02 AM

## 2023-02-18 NOTE — Addendum Note (Signed)
Addended by: Marjo Bicker on: 02/18/2023 02:59 PM   Modules accepted: Orders

## 2023-02-19 LAB — CERVICOVAGINAL ANCILLARY ONLY
Bacterial Vaginitis (gardnerella): NEGATIVE
Candida Glabrata: POSITIVE — AB
Candida Vaginitis: POSITIVE — AB
Chlamydia: NEGATIVE
Comment: NEGATIVE
Comment: NEGATIVE
Comment: NEGATIVE
Comment: NEGATIVE
Comment: NEGATIVE
Comment: NORMAL
Neisseria Gonorrhea: NEGATIVE
Trichomonas: NEGATIVE

## 2023-03-03 ENCOUNTER — Ambulatory Visit: Payer: Medicaid Other | Admitting: Advanced Practice Midwife

## 2023-03-03 VITALS — BP 119/82 | HR 89 | Wt 154.8 lb

## 2023-03-03 DIAGNOSIS — Z3480 Encounter for supervision of other normal pregnancy, unspecified trimester: Secondary | ICD-10-CM

## 2023-03-03 DIAGNOSIS — Z98891 History of uterine scar from previous surgery: Secondary | ICD-10-CM

## 2023-03-03 DIAGNOSIS — Z3A23 23 weeks gestation of pregnancy: Secondary | ICD-10-CM

## 2023-03-03 DIAGNOSIS — O98119 Syphilis complicating pregnancy, unspecified trimester: Secondary | ICD-10-CM

## 2023-03-03 DIAGNOSIS — O98112 Syphilis complicating pregnancy, second trimester: Secondary | ICD-10-CM

## 2023-03-03 NOTE — Progress Notes (Signed)
       PRENATAL VISIT NOTE- Centering Pregnancy Cycle 12, Session # 3  Subjective:  Erin Bell is a 32 y.o. G3P2002 at [redacted]w[redacted]d being seen today for ongoing prenatal care through Centering Pregnancy.  She is currently monitored for the following issues for this low-risk pregnancy and has Hypothyroid in pregnancy, antepartum, third trimester; History of cesarean delivery; Supervision of other normal pregnancy, antepartum; and Syphilis affecting pregnancy on their problem list.  Patient reports no complaints.   . Vag. Bleeding: None.  Movement: Present. Denies leaking of fluid/ROM.   The following portions of the patient's history were reviewed and updated as appropriate: allergies, current medications, past family history, past medical history, past social history, past surgical history and problem list. Problem list updated.  Objective:   Vitals:   03/03/23 0911  BP: 119/82  Pulse: 89  Weight: 154 lb 12.8 oz (70.2 kg)    Fetal Status: Fetal Heart Rate (bpm): 157 Fundal Height: 24 cm Movement: Present     General:  Alert, oriented and cooperative. Patient is in no acute distress.  Skin: Skin is warm and dry. No rash noted.   Cardiovascular: Normal heart rate noted  Respiratory: Normal respiratory effort, no problems with respiration noted  Abdomen: Soft, gravid, appropriate for gestational age.  Pain/Pressure: Absent     Pelvic: Cervical exam deferred        Extremities: Normal range of motion.  Edema: None  Mental Status: Normal mood and affect. Normal behavior. Normal judgment and thought content.    Assessment and Plan:  Pregnancy: G3P2002 at [redacted]w[redacted]d 1. Supervision of other normal pregnancy, antepartum --Anticipatory guidance about next visits/weeks of pregnancy given.   Centering Pregnancy, Session#3: Reviewed resources in [employer].   Facilitated discussion today:  Stress/Stress reduction, breastfeeding/infant nutrition, oral health, mindfulness activity  completed as well as deep breathing/relaxation/centering  Fundal height and FHR appropriate today unless noted otherwise in plan. Patient to continue group care.     2. History of cesarean delivery --Appt with MD for VBAC consent  3. 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 [redacted] weeks gestation of pregnancy   Preterm labor symptoms and general obstetric precautions including but not limited to vaginal bleeding, contractions, leaking of fluid and fetal movement were reviewed in detail with the patient. Please refer to After Visit Summary for other counseling recommendations.    Return in about 2 weeks (around 03/17/2023) for Centering Sessions as scheduled.  Future Appointments  Date Time Provider Department Center  03/11/2023  3:30 PM [facility] US2 [facility]  03/17/2023  9:00 AM CENTERING PROVIDER [facility]  03/31/2023  8:20 AM [facility] LAB [facility]  03/31/2023  9:00 AM CENTERING PROVIDER [facility]  04/14/2023  9:00 AM CENTERING PROVIDER [facility]  04/28/2023  9:00 AM CENTERING PROVIDER [facility]  05/12/2023  9:00 AM CENTERING PROVIDER [facility]  05/26/2023  9:00 AM CENTERING PROVIDER [facility]  06/09/2023  9:00 AM CENTERING PROVIDER [facility]    Sharen Counter, CNM

## 2023-03-05 ENCOUNTER — Other Ambulatory Visit: Payer: Self-pay | Admitting: Family Medicine

## 2023-03-05 MED ORDER — CLOTRIMAZOLE 1 % VA CREA: 1.0000 | TOPICAL_CREAM | Freq: Every day | VAGINAL | 0 refills | Status: AC

## 2023-03-11 ENCOUNTER — Ambulatory Visit: Payer: Medicaid Other | Attending: Maternal & Fetal Medicine

## 2023-03-11 DIAGNOSIS — O99280 Endocrine, nutritional and metabolic diseases complicating pregnancy, unspecified trimester: Secondary | ICD-10-CM | POA: Insufficient documentation

## 2023-03-11 DIAGNOSIS — O99282 Endocrine, nutritional and metabolic diseases complicating pregnancy, second trimester: Secondary | ICD-10-CM | POA: Diagnosis not present

## 2023-03-11 DIAGNOSIS — Z362 Encounter for other antenatal screening follow-up: Secondary | ICD-10-CM | POA: Diagnosis present

## 2023-03-11 DIAGNOSIS — O34219 Maternal care for unspecified type scar from previous cesarean delivery: Secondary | ICD-10-CM

## 2023-03-11 DIAGNOSIS — O98112 Syphilis complicating pregnancy, second trimester: Secondary | ICD-10-CM | POA: Diagnosis not present

## 2023-03-11 DIAGNOSIS — Z3A24 24 weeks gestation of pregnancy: Secondary | ICD-10-CM

## 2023-03-11 DIAGNOSIS — E039 Hypothyroidism, unspecified: Secondary | ICD-10-CM | POA: Diagnosis not present

## 2023-03-11 DIAGNOSIS — Z8679 Personal history of other diseases of the circulatory system: Secondary | ICD-10-CM | POA: Insufficient documentation

## 2023-03-12 ENCOUNTER — Other Ambulatory Visit: Payer: Self-pay | Admitting: *Deleted

## 2023-03-12 DIAGNOSIS — Z8679 Personal history of other diseases of the circulatory system: Secondary | ICD-10-CM

## 2023-03-12 DIAGNOSIS — E079 Disorder of thyroid, unspecified: Secondary | ICD-10-CM

## 2023-03-12 DIAGNOSIS — O98119 Syphilis complicating pregnancy, unspecified trimester: Secondary | ICD-10-CM

## 2023-03-16 NOTE — Progress Notes (Unsigned)
       PRENATAL VISIT NOTE- Centering Pregnancy Cycle {NUMBERS:20191}, Session # {NUMBERS:20191}  Subjective:  Erin Bell is a 32 y.o. G3P2002 at [redacted]w[redacted]d being seen today for ongoing prenatal care through Centering Pregnancy.  She is currently monitored for the following issues for this {Blank single:19197::"high-risk","low-risk"} pregnancy and has Hypothyroid in pregnancy, antepartum, third trimester; History of cesarean delivery; Supervision of other normal pregnancy, antepartum; and Syphilis affecting pregnancy on their problem list.  Patient reports {sx:14538}.   .  .   . ***Denies leaking of fluid/ROM.   The following portions of the patient's history were reviewed and updated as appropriate: allergies, current medications, past family history, past medical history, past social history, past surgical history and problem list. Problem list updated.  Objective:  There were no vitals filed for this visit.  Fetal Status:           General:  Alert, oriented and cooperative. Patient is in no acute distress.  Skin: Skin is warm and dry. No rash noted.   Cardiovascular: Normal heart rate noted  Respiratory: Normal respiratory effort, no problems with respiration noted  Abdomen: Soft, gravid, appropriate for gestational age.        Pelvic: {Blank single:19197::"Cervical exam performed","Cervical exam deferred"}        Extremities: Normal range of motion.     Mental Status: Normal mood and affect. Normal behavior. Normal judgment and thought content.   Assessment and Plan:  Pregnancy: G3P2002 at [redacted]w[redacted]d  1. Supervision of other normal pregnancy, antepartum ***  Centering Pregnancy, Session#4: Reviewed resources in [employer].   Facilitated discussion today:  Family planning intimate partner violence, and preterm labor Mindfulness activity completed.   Fundal height and FHR appropriate today unless noted otherwise in plan. Patient to continue group care.   --GTT scheduled  on 7/1, with next Centering session  2. History of cesarean delivery ***  3. History of VBAC *** --Plans TOLAC, appt with Dr Donavan Foil on 7/10 for VBAC consent  4. [redacted] weeks gestation of pregnancy ***   *** add [facility] dot phrase for session   {Blank single:19197::"Term","Preterm"} labor symptoms and general obstetric precautions including but not limited to vaginal bleeding, contractions, leaking of fluid and fetal movement were reviewed in detail with the patient. Please refer to After Visit Summary for other counseling recommendations.  No follow-ups on file.  Future Appointments  Date Time Provider Department Center  03/17/2023  9:00 AM CENTERING PROVIDER [facility]  03/31/2023  8:20 AM [facility] LAB [facility]  03/31/2023  9:00 AM CENTERING PROVIDER [facility]  04/09/2023  8:55 AM Warden Fillers, MD [facility]  04/14/2023  9:00 AM CENTERING PROVIDER [facility]  04/23/2023 11:15 AM [facility] NURSE [facility]  04/23/2023 11:30 AM [facility] US3 [facility]  04/28/2023  9:00 AM CENTERING PROVIDER [facility]  05/12/2023  9:00 AM CENTERING PROVIDER [facility]  05/26/2023  9:00 AM CENTERING PROVIDER [facility]  06/09/2023  9:00 AM CENTERING PROVIDER [facility]    Sharen Counter, CNM

## 2023-03-17 ENCOUNTER — Encounter: Payer: Self-pay | Admitting: Advanced Practice Midwife

## 2023-03-17 ENCOUNTER — Ambulatory Visit (INDEPENDENT_AMBULATORY_CARE_PROVIDER_SITE_OTHER): Payer: Medicaid Other | Admitting: Advanced Practice Midwife

## 2023-03-17 VITALS — BP 125/67 | HR 81 | Wt 155.0 lb

## 2023-03-17 DIAGNOSIS — O26892 Other specified pregnancy related conditions, second trimester: Secondary | ICD-10-CM

## 2023-03-17 DIAGNOSIS — Z23 Encounter for immunization: Secondary | ICD-10-CM | POA: Diagnosis not present

## 2023-03-17 DIAGNOSIS — Z8639 Personal history of other endocrine, nutritional and metabolic disease: Secondary | ICD-10-CM

## 2023-03-17 DIAGNOSIS — R109 Unspecified abdominal pain: Secondary | ICD-10-CM

## 2023-03-17 DIAGNOSIS — O26899 Other specified pregnancy related conditions, unspecified trimester: Secondary | ICD-10-CM

## 2023-03-17 DIAGNOSIS — Z3A25 25 weeks gestation of pregnancy: Secondary | ICD-10-CM

## 2023-03-17 DIAGNOSIS — Z3480 Encounter for supervision of other normal pregnancy, unspecified trimester: Secondary | ICD-10-CM

## 2023-03-17 DIAGNOSIS — Z98891 History of uterine scar from previous surgery: Secondary | ICD-10-CM

## 2023-03-17 NOTE — Addendum Note (Signed)
Addended by: Faythe Casa on: 03/17/2023 02:24 PM   Modules accepted: Orders

## 2023-03-28 ENCOUNTER — Other Ambulatory Visit: Payer: Self-pay

## 2023-03-28 DIAGNOSIS — Z3480 Encounter for supervision of other normal pregnancy, unspecified trimester: Secondary | ICD-10-CM

## 2023-03-31 ENCOUNTER — Other Ambulatory Visit: Payer: Medicaid Other

## 2023-03-31 ENCOUNTER — Ambulatory Visit (INDEPENDENT_AMBULATORY_CARE_PROVIDER_SITE_OTHER): Payer: Medicaid Other | Admitting: Advanced Practice Midwife

## 2023-03-31 ENCOUNTER — Other Ambulatory Visit: Payer: Self-pay

## 2023-03-31 VITALS — BP 117/77 | HR 76 | Wt 157.6 lb

## 2023-03-31 DIAGNOSIS — Z3A27 27 weeks gestation of pregnancy: Secondary | ICD-10-CM | POA: Diagnosis not present

## 2023-03-31 DIAGNOSIS — Z3480 Encounter for supervision of other normal pregnancy, unspecified trimester: Secondary | ICD-10-CM

## 2023-03-31 DIAGNOSIS — O98119 Syphilis complicating pregnancy, unspecified trimester: Secondary | ICD-10-CM | POA: Diagnosis not present

## 2023-03-31 DIAGNOSIS — Z8639 Personal history of other endocrine, nutritional and metabolic disease: Secondary | ICD-10-CM

## 2023-03-31 DIAGNOSIS — Z98891 History of uterine scar from previous surgery: Secondary | ICD-10-CM

## 2023-04-01 LAB — RPR, QUANT+TP ABS (REFLEX)
Rapid Plasma Reagin, Quant: 1:4 {titer} — ABNORMAL HIGH
T Pallidum Abs: REACTIVE — AB

## 2023-04-01 LAB — TSH+FREE T4
Free T4: 1.21 ng/dL (ref 0.82–1.77)
TSH: <0.005 u[IU]/mL — ABNORMAL LOW (ref 0.450–4.500)

## 2023-04-01 LAB — HIV ANTIBODY (ROUTINE TESTING W REFLEX): HIV Screen 4th Generation wRfx: NONREACTIVE

## 2023-04-01 LAB — CBC
Hematocrit: 38.2 % (ref 34.0–46.6)
Hemoglobin: 12.9 g/dL (ref 11.1–15.9)
MCH: 29.4 pg (ref 26.6–33.0)
MCHC: 33.8 g/dL (ref 31.5–35.7)
MCV: 87 fL (ref 79–97)
Platelets: 237 10*3/uL (ref 150–450)
RBC: 4.39 x10E6/uL (ref 3.77–5.28)
RDW: 12.7 % (ref 11.7–15.4)
WBC: 11.2 10*3/uL — ABNORMAL HIGH (ref 3.4–10.8)

## 2023-04-01 LAB — GLUCOSE TOLERANCE, 2 HOURS W/ 1HR
Glucose, 1 hour: 130 mg/dL (ref 70–179)
Glucose, 2 hour: 82 mg/dL (ref 70–152)
Glucose, Fasting: 82 mg/dL (ref 70–91)

## 2023-04-01 LAB — RPR: RPR Ser Ql: REACTIVE — AB

## 2023-04-07 NOTE — Progress Notes (Signed)
       PRENATAL VISIT NOTE- Centering Pregnancy Cycle 12, Session # 5  Subjective:  Erin Bell is a 32 y.o. G3P2002 at [redacted]w[redacted]d being seen today for ongoing prenatal care through Centering Pregnancy.  She is currently monitored for the following issues for this low-risk pregnancy and has History of hypothyroidism; History of cesarean delivery; Supervision of other normal pregnancy, antepartum; and Syphilis affecting pregnancy on their problem list.  Patient reports no complaints.  Contractions: Not present. Vag. Bleeding: None.  Movement: Present. Denies leaking of fluid/ROM.   The following portions of the patient's history were reviewed and updated as appropriate: allergies, current medications, past family history, past medical history, past social history, past surgical history and problem list. Problem list updated.  Objective:   Vitals:   03/31/23 0923  BP: 117/77  Pulse: 76  Weight: 157 lb 9.6 oz (71.5 kg)    Fetal Status: Fetal Heart Rate (bpm): 140 Fundal Height: 28 cm Movement: Present     General:  Alert, oriented and cooperative. Patient is in no acute distress.  Skin: Skin is warm and dry. No rash noted.   Cardiovascular: Normal heart rate noted  Respiratory: Normal respiratory effort, no problems with respiration noted  Abdomen: Soft, gravid, appropriate for gestational age.  Pain/Pressure: Absent     Pelvic: Cervical exam deferred        Extremities: Normal range of motion.  Edema: None  Mental Status: Normal mood and affect. Normal behavior. Normal judgment and thought content.   Assessment and Plan:  Pregnancy: G3P2002 at [redacted]w[redacted]d  1. Supervision of other normal pregnancy, antepartum --Anticipatory guidance about next visits/weeks of pregnancy given.   Centering Pregnancy, Session#5: Reviewed resources in [employer].   Facilitated discussion today:  Signs of labor, labor positions, coping strategies/support measures, preterm labor  Fundal height and  FHR appropriate today unless noted otherwise in plan. Patient to continue group care.     2. History of cesarean delivery --Needs VBAC consent, appt with Dr Donavan Foil 7/10  3. History of VBAC   4. Syphilis affecting pregnancy, antepartum --Adequate tx.  RPR negative 01/2023, positive 07/2023.  Tx 08/2023 and again 12/2022 but only one dose needed for early syphilis.   5. [redacted] weeks gestation of pregnancy     Preterm labor symptoms and general obstetric precautions including but not limited to vaginal bleeding, contractions, leaking of fluid and fetal movement were reviewed in detail with the patient. Please refer to After Visit Summary for other counseling recommendations.  Return for Centering Sessions as scheduled.  Future Appointments  Date Time Provider Department Center  04/09/2023  8:55 AM Warden Fillers, MD [facility]  04/14/2023  9:00 AM CENTERING PROVIDER [facility]  04/23/2023 11:15 AM [facility] NURSE [facility]  04/23/2023 11:30 AM [facility] US3 [facility]  04/28/2023  9:00 AM CENTERING PROVIDER [facility]  05/12/2023  9:00 AM CENTERING PROVIDER [facility]  05/26/2023  9:00 AM CENTERING PROVIDER [facility]  06/09/2023  9:00 AM CENTERING PROVIDER [facility]    Sharen Counter, CNM

## 2023-04-09 ENCOUNTER — Other Ambulatory Visit: Payer: Self-pay

## 2023-04-09 ENCOUNTER — Ambulatory Visit (INDEPENDENT_AMBULATORY_CARE_PROVIDER_SITE_OTHER): Payer: Medicaid Other | Admitting: Obstetrics and Gynecology

## 2023-04-09 ENCOUNTER — Other Ambulatory Visit (HOSPITAL_COMMUNITY): Admission: RE | Admit: 2023-04-09 | Payer: Medicaid Other | Source: Ambulatory Visit

## 2023-04-09 VITALS — BP 101/67 | HR 65 | Wt 155.8 lb

## 2023-04-09 DIAGNOSIS — Z3A28 28 weeks gestation of pregnancy: Secondary | ICD-10-CM

## 2023-04-09 DIAGNOSIS — O98113 Syphilis complicating pregnancy, third trimester: Secondary | ICD-10-CM

## 2023-04-09 DIAGNOSIS — Z98891 History of uterine scar from previous surgery: Secondary | ICD-10-CM

## 2023-04-09 DIAGNOSIS — Z0189 Encounter for other specified special examinations: Secondary | ICD-10-CM

## 2023-04-09 DIAGNOSIS — Z8639 Personal history of other endocrine, nutritional and metabolic disease: Secondary | ICD-10-CM

## 2023-04-09 NOTE — Progress Notes (Signed)
PRENATAL VISIT NOTE  Subjective:  Erin Bell is a 32 y.o. G3P2002 at [redacted]w[redacted]d being seen today for ongoing prenatal care.  She is currently monitored for the following issues for this high-risk pregnancy and has History of hypothyroidism; History of cesarean delivery; Supervision of other normal pregnancy, antepartum; and Syphilis affecting pregnancy on their problem list.  Patient doing well with no acute concerns today. She reports no complaints.  Contractions: Irritability. Vag. Bleeding: None.  Movement: Present. Denies leaking of fluid.   The following portions of the patient's history were reviewed and updated as appropriate: allergies, current medications, past family history, past medical history, past social history, past surgical history and problem list. Problem list updated.  Objective:   Vitals:   04/09/23 0851  BP: 101/67  Pulse: 65  Weight: 155 lb 12.8 oz (70.7 kg)    Fetal Status: Fetal Heart Rate (bpm): 155 Fundal Height: 28 cm Movement: Present     General:  Alert, oriented and cooperative. Patient is in no acute distress.  Skin: Skin is warm and dry. No rash noted.   Cardiovascular: Normal heart rate noted  Respiratory: Normal respiratory effort, no problems with respiration noted  Abdomen: Soft, gravid, appropriate for gestational age.  Pain/Pressure: Present     Pelvic: Cervical exam deferred        Extremities: Normal range of motion.  Edema: None  Mental Status:  Normal mood and affect. Normal behavior. Normal judgment and thought content.   Assessment and Plan:  Pregnancy: G3P2002 at [redacted]w[redacted]d  1. [redacted] weeks gestation of pregnancy   2. Patient request for diagnostic testing Self swab for possible vaginal discharge - Cervicovaginal ancillary only( [place])  3. Syphilis affecting pregnancy in third trimester Pt previously treated  4. History of hypothyroidism TSH is very low suggesting hyper thyroidism.  T4 is normal.  Discuissed patient with Dr.  Adrian Blackwater, as patient is asymptomatic, advise recheck thyroid panel in August  5. History of cesarean delivery Pt consented for VBAC, has hx of previous successful VBAC.  Consent signed.    32 y.o. [id number] at [redacted]w[redacted]d with Estimated Date of Delivery: 06/29/23 was seen today in office to discuss trial of labor after cesarean section (TOLAC) versus elective repeat cesarean delivery (ERCD). The following risks were discussed with the patient.  Risk of uterine rupture at term is 0.78 percent with TOLAC and 0.22 percent with ERCD. 1 in 10 uterine ruptures will result in neonatal death or neurological injury. The benefits of a trial of labor after cesarean (TOLAC) resulting in a vaginal birth after cesarean (VBAC) include the following: shorter length of hospital stay and postpartum recovery (in most cases); fewer complications, such as postpartum fever, wound or uterine infection, thromboembolism (blood clots in the leg or lung), need for blood transfusion and fewer neonatal breathing problems. The risks of an attempted VBAC or TOLAC include the following: Risk of failed trial of labor after cesarean (TOLAC) without a vaginal birth after cesarean (VBAC) resulting in repeat cesarean delivery (RCD) in about 20 to 40 percent of women who attempt VBAC.  Risk of rupture of uterus resulting in an emergency cesarean delivery. The risk of uterine rupture may be related in part to the type of uterine incision made during the first cesarean delivery. A previous transverse uterine incision has the lowest risk of rupture (0.2 to 1.5 percent risk). Vertical or T-shaped uterine incisions have a higher risk of uterine rupture (4 to 9 percent risk)The risk of fetal death is  very low with both VBAC and elective repeat cesarean delivery (ERCD), but the likelihood of fetal death is higher with VBAC than with ERCD. Maternal death is very rare with either type of delivery. The risks of an elective repeat cesarean delivery (ERCD) were  reviewed with the patient including but not limited to: 10/998 risk of uterine rupture which could have serious consequences, bleeding which may require transfusion; infection which may require antibiotics; injury to bowel, bladder or other surrounding organs (bowel, bladder, ureters); injury to the fetus; need for additional procedures including hysterectomy in the event of a life-threatening hemorrhage; thromboembolic phenomenon; abnormal placentation; incisional problems; death and other postoperative or anesthesia complications.    These risks and benefits are summarized on the consent form, which was reviewed with the patient during the visit.  All her questions answered and she signed a consent indicating a preference for TOLAC/ERCD. A copy of the consent was given to the patient.  Pt desires possible nexplanon for birth control    Preterm labor symptoms and general obstetric precautions including but not limited to vaginal bleeding, contractions, leaking of fluid and fetal movement were reviewed in detail with the patient.  Please refer to After Visit Summary for other counseling recommendations.   Return in about 2 weeks (around 04/23/2023) for [facility], in person. Pt will return to centering schedule.   Mariel Aloe, MD Faculty Attending Center for [facility]

## 2023-04-10 ENCOUNTER — Encounter: Payer: Self-pay | Admitting: General Practice

## 2023-04-10 LAB — CERVICOVAGINAL ANCILLARY ONLY
Bacterial Vaginitis (gardnerella): NEGATIVE
Candida Glabrata: NEGATIVE
Candida Vaginitis: NEGATIVE
Chlamydia: NEGATIVE
Comment: NEGATIVE
Comment: NEGATIVE
Comment: NEGATIVE
Comment: NEGATIVE
Comment: NEGATIVE
Comment: NORMAL
Neisseria Gonorrhea: NEGATIVE
Trichomonas: NEGATIVE

## 2023-04-14 ENCOUNTER — Ambulatory Visit: Payer: Medicaid Other | Admitting: Advanced Practice Midwife

## 2023-04-14 DIAGNOSIS — O26893 Other specified pregnancy related conditions, third trimester: Secondary | ICD-10-CM

## 2023-04-14 DIAGNOSIS — O26899 Other specified pregnancy related conditions, unspecified trimester: Secondary | ICD-10-CM

## 2023-04-14 DIAGNOSIS — Z3480 Encounter for supervision of other normal pregnancy, unspecified trimester: Secondary | ICD-10-CM

## 2023-04-14 DIAGNOSIS — Z3A30 30 weeks gestation of pregnancy: Secondary | ICD-10-CM

## 2023-04-14 DIAGNOSIS — O98113 Syphilis complicating pregnancy, third trimester: Secondary | ICD-10-CM | POA: Diagnosis not present

## 2023-04-14 DIAGNOSIS — R109 Unspecified abdominal pain: Secondary | ICD-10-CM

## 2023-04-14 DIAGNOSIS — Z98891 History of uterine scar from previous surgery: Secondary | ICD-10-CM

## 2023-04-14 LAB — POCT URINALYSIS DIP (DEVICE)
Bilirubin Urine: NEGATIVE
Glucose, UA: NEGATIVE mg/dL
Hgb urine dipstick: NEGATIVE
Ketones, ur: NEGATIVE mg/dL
Leukocytes,Ua: NEGATIVE
Nitrite: NEGATIVE
Protein, ur: NEGATIVE mg/dL
Specific Gravity, Urine: 1.015 (ref 1.005–1.030)
Urobilinogen, UA: 1 mg/dL (ref 0.0–1.0)
pH: 6 (ref 5.0–8.0)

## 2023-04-14 MED ORDER — PENICILLIN G BENZATHINE 1200000 UNIT/2ML IM SUSY
2.4000 10*6.[IU] | PREFILLED_SYRINGE | Freq: Once | INTRAMUSCULAR | Status: AC
Start: 2023-04-14 — End: 2023-04-14
  Administered 2023-04-14: 2.4 10*6.[IU] via INTRAMUSCULAR

## 2023-04-16 LAB — CULTURE, OB URINE

## 2023-04-16 LAB — URINE CULTURE, OB REFLEX

## 2023-04-23 ENCOUNTER — Ambulatory Visit: Payer: Medicaid Other | Admitting: *Deleted

## 2023-04-23 ENCOUNTER — Ambulatory Visit: Payer: Medicaid Other | Attending: Maternal & Fetal Medicine

## 2023-04-23 ENCOUNTER — Other Ambulatory Visit: Payer: Self-pay | Admitting: *Deleted

## 2023-04-23 VITALS — BP 105/55 | HR 82

## 2023-04-23 DIAGNOSIS — Z3A30 30 weeks gestation of pregnancy: Secondary | ICD-10-CM

## 2023-04-23 DIAGNOSIS — O99280 Endocrine, nutritional and metabolic diseases complicating pregnancy, unspecified trimester: Secondary | ICD-10-CM

## 2023-04-23 DIAGNOSIS — E079 Disorder of thyroid, unspecified: Secondary | ICD-10-CM | POA: Diagnosis not present

## 2023-04-23 DIAGNOSIS — A539 Syphilis, unspecified: Secondary | ICD-10-CM

## 2023-04-23 DIAGNOSIS — Z3689 Encounter for other specified antenatal screening: Secondary | ICD-10-CM

## 2023-04-23 DIAGNOSIS — O99283 Endocrine, nutritional and metabolic diseases complicating pregnancy, third trimester: Secondary | ICD-10-CM | POA: Diagnosis present

## 2023-04-23 DIAGNOSIS — O4193X0 Disorder of amniotic fluid and membranes, unspecified, third trimester, not applicable or unspecified: Secondary | ICD-10-CM

## 2023-04-23 DIAGNOSIS — O98113 Syphilis complicating pregnancy, third trimester: Secondary | ICD-10-CM

## 2023-04-23 DIAGNOSIS — Z8679 Personal history of other diseases of the circulatory system: Secondary | ICD-10-CM | POA: Insufficient documentation

## 2023-04-23 DIAGNOSIS — O98119 Syphilis complicating pregnancy, unspecified trimester: Secondary | ICD-10-CM | POA: Diagnosis not present

## 2023-04-23 DIAGNOSIS — E039 Hypothyroidism, unspecified: Secondary | ICD-10-CM | POA: Insufficient documentation

## 2023-04-23 NOTE — Progress Notes (Signed)
       PRENATAL VISIT NOTE- Centering Pregnancy Cycle 12, Session # 6  Subjective:  Erin Bell is a 32 y.o. G3P2002 at [redacted]w[redacted]d being seen today for ongoing prenatal care through Centering Pregnancy.  She is currently monitored for the following issues for this low-risk pregnancy and has History of hypothyroidism; History of cesarean delivery; Supervision of other normal pregnancy, antepartum; and Syphilis affecting pregnancy on their problem list.  Patient reports occasional contractions.  Contractions: Irregular. Vag. Bleeding: None.  Movement: Present. Denies leaking of fluid/ROM.   The following portions of the patient's history were reviewed and updated as appropriate: allergies, current medications, past family history, past medical history, past social history, past surgical history and problem list. Problem list updated.  Objective:  There were no vitals filed for this visit.  Fetal Status: Fetal Heart Rate (bpm): 138 Fundal Height: 28 cm Movement: Present     General:  Alert, oriented and cooperative. Patient is in no acute distress.  Skin: Skin is warm and dry. No rash noted.   Cardiovascular: Normal heart rate noted  Respiratory: Normal respiratory effort, no problems with respiration noted  Abdomen: Soft, gravid, appropriate for gestational age.  Pain/Pressure: Present     Pelvic: Cervical exam deferred        Extremities: Normal range of motion.  Edema: None  Mental Status: Normal mood and affect. Normal behavior. Normal judgment and thought content.   Assessment and Plan:  Pregnancy: G3P2002 at [redacted]w[redacted]d  1. Supervision of other normal pregnancy, antepartum --Anticipatory guidance about next visits/weeks of pregnancy given.    Centering Pregnancy, Session#6: Reviewed resources in [employer].  Facilitated discussion today: stages of labor, coping strategies, newborn care/safety   Mindfulness activity with positive affirmations   Fundal height and FHR  appropriate today unless noted otherwise in plan. Patient to continue group care.    2. History of VBAC --Desires VBAC this pregnancy  3. 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  Treated 07/2022 and again in early pregnancy.  No further tx per MD consult but pt concerned about titer results so discussed and with shared decision making, another single dose of PCN given today.  - penicillin g benzathine (BICILLIN LA) 1200000 UNIT/2ML injection 2.4 Million Units  4. Cramping affecting pregnancy, antepartum  - Culture, OB Urine    Preterm labor symptoms and general obstetric precautions including but not limited to vaginal bleeding, contractions, leaking of fluid and fetal movement were reviewed in detail with the patient. Please refer to After Visit Summary for other counseling recommendations.  Return for Centering Sessions as scheduled.  Future Appointments  Date Time Provider Department Center  05/12/2023  9:00 AM CENTERING PROVIDER [facility]  05/26/2023  7:30 AM [facility] NURSE [facility]  05/26/2023  7:45 AM [facility] US4 [facility]  05/26/2023  9:00 AM CENTERING PROVIDER [facility]  06/09/2023  9:00 AM CENTERING PROVIDER [facility]    Sharen Counter, CNM

## 2023-04-25 ENCOUNTER — Encounter: Payer: Self-pay | Admitting: Advanced Practice Midwife

## 2023-04-25 ENCOUNTER — Other Ambulatory Visit: Payer: Self-pay | Admitting: Advanced Practice Midwife

## 2023-04-25 ENCOUNTER — Other Ambulatory Visit: Payer: Self-pay

## 2023-04-25 DIAGNOSIS — L853 Xerosis cutis: Secondary | ICD-10-CM

## 2023-04-25 DIAGNOSIS — Z3480 Encounter for supervision of other normal pregnancy, unspecified trimester: Secondary | ICD-10-CM

## 2023-04-25 MED ORDER — TRIAMCINOLONE ACETONIDE 0.1 % EX CREA
1.0000 | TOPICAL_CREAM | Freq: Two times a day (BID) | CUTANEOUS | 0 refills | Status: AC
Start: 2023-04-25 — End: ?

## 2023-04-25 MED ORDER — TRIAMCINOLONE ACETONIDE 0.1 % EX CREA
1.0000 | TOPICAL_CREAM | Freq: Two times a day (BID) | CUTANEOUS | 0 refills | Status: DC
Start: 2023-04-25 — End: 2023-04-25
  Filled 2023-04-25: qty 30, 15d supply, fill #0

## 2023-04-25 MED ORDER — PRENATAL PLUS 27-1 MG PO TABS
1.0000 | ORAL_TABLET | Freq: Every day | ORAL | 11 refills | Status: DC
Start: 2023-04-25 — End: 2023-11-06

## 2023-04-25 NOTE — Progress Notes (Signed)
Rx for PNV and triamcinolone cream sent to pharmacy at pt request.

## 2023-04-28 ENCOUNTER — Ambulatory Visit: Payer: Medicaid Other

## 2023-04-28 VITALS — BP 122/70 | HR 52 | Wt 159.4 lb

## 2023-04-28 DIAGNOSIS — Z3A31 31 weeks gestation of pregnancy: Secondary | ICD-10-CM | POA: Diagnosis not present

## 2023-04-28 DIAGNOSIS — M25572 Pain in left ankle and joints of left foot: Secondary | ICD-10-CM

## 2023-04-28 DIAGNOSIS — M25571 Pain in right ankle and joints of right foot: Secondary | ICD-10-CM

## 2023-04-28 DIAGNOSIS — O26893 Other specified pregnancy related conditions, third trimester: Secondary | ICD-10-CM

## 2023-04-28 DIAGNOSIS — Z98891 History of uterine scar from previous surgery: Secondary | ICD-10-CM

## 2023-04-28 DIAGNOSIS — R768 Other specified abnormal immunological findings in serum: Secondary | ICD-10-CM

## 2023-04-28 DIAGNOSIS — O98113 Syphilis complicating pregnancy, third trimester: Secondary | ICD-10-CM

## 2023-04-28 DIAGNOSIS — Z3480 Encounter for supervision of other normal pregnancy, unspecified trimester: Secondary | ICD-10-CM

## 2023-04-28 DIAGNOSIS — R109 Unspecified abdominal pain: Secondary | ICD-10-CM

## 2023-04-28 NOTE — Progress Notes (Signed)
       PRENATAL VISIT NOTE- Centering Pregnancy Cycle 12, Session # 7  Subjective:  Erin Bell is a 32 y.o. G3P2002 at [redacted]w[redacted]d being seen today for ongoing prenatal care through Centering Pregnancy.  She is currently monitored for the following issues for this low-risk pregnancy and has History of hypothyroidism; History of cesarean delivery; Supervision of other normal pregnancy, antepartum; and Syphilis affecting pregnancy on their problem list.  Patient reports  abdominal/pelvic pain and ankle pain .  Contractions: Not present. Vag. Bleeding: None.  Movement: Present. Denies leaking of fluid/ROM.   The following portions of the patient's history were reviewed and updated as appropriate: allergies, current medications, past family history, past medical history, past social history, past surgical history and problem list. Problem list updated.  Objective:   Vitals:   04/28/23 0919  BP: 122/70  Pulse: (!) 52  Weight: 159 lb 6.4 oz (72.3 kg)    Fetal Status: Fetal Heart Rate (bpm): 144 Fundal Height: 32 cm Movement: Present     General:  Alert, oriented and cooperative. Patient is in no acute distress.  Skin: Skin is warm and dry. No rash noted.   Cardiovascular: Normal heart rate noted  Respiratory: Normal respiratory effort, no problems with respiration noted  Abdomen: Soft, gravid, appropriate for gestational age.  Pain/Pressure: Present     Pelvic: Cervical exam deferred        Extremities: Normal range of motion.  Edema: None (pain in legs, ankles at work)  Mental Status: Normal mood and affect. Normal behavior. Normal judgment and thought content.   Assessment and Plan:  Pregnancy: G3P2002 at [redacted]w[redacted]d  1. Supervision of other normal pregnancy, antepartum --Anticipatory guidance about next visits/weeks of pregnancy given.    Centering Pregnancy, Session#7: Reviewed resources in [employer].   Facilitated discussion today:  parenting, newborn safety, breastfeeding    Fundal height and FHR appropriate today unless noted otherwise in plan. Patient to continue group care.   2. History of VBAC -Plans VBAC this pregnancy, consent in chart  3. 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  Only needed 1 injection with nonreactive RPR ~ 6 months prior to positive RPR but pt received 3 doses of PCN (see previous notes).  4. 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 Abdominal pain during pregnancy in third trimester --Urine negative on 7/15, pain is constant in groin area, worse with movement or rolling over in bed.  Likely MSK pain, round ligament pain.  --Rest/ice/heat/warm bath/increase PO fluids/Tylenol/pregnancy support belt     - AMB referral to rehabilitation   7. Acute bilateral ankle pain --Pain in feet ankles after standing and when at work sitting.  No appreciable edema but compression socks, elevation, heat may help.    Preterm labor symptoms and general obstetric precautions including but not limited to vaginal bleeding, contractions, leaking of fluid and fetal movement were reviewed in detail with the patient. Please refer to After Visit Summary for other counseling recommendations.  Return for Centering Sessions as scheduled.  Future Appointments  Date Time Provider Department Center  05/12/2023  9:00 AM CENTERING PROVIDER [facility]  05/26/2023  7:30 AM [facility] NURSE [facility]  05/26/2023  7:45 AM [facility] US4 [facility]  05/26/2023  9:00 AM CENTERING PROVIDER [facility]  06/09/2023  9:00 AM CENTERING PROVIDER [facility]    Sharen Counter, CNM

## 2023-05-05 ENCOUNTER — Encounter: Payer: Self-pay | Admitting: *Deleted

## 2023-05-12 ENCOUNTER — Ambulatory Visit: Payer: Medicaid Other | Admitting: Family Medicine

## 2023-05-12 VITALS — BP 111/75 | HR 103 | Wt 164.0 lb

## 2023-05-12 DIAGNOSIS — O99283 Endocrine, nutritional and metabolic diseases complicating pregnancy, third trimester: Secondary | ICD-10-CM

## 2023-05-12 DIAGNOSIS — Z98891 History of uterine scar from previous surgery: Secondary | ICD-10-CM

## 2023-05-12 DIAGNOSIS — O98113 Syphilis complicating pregnancy, third trimester: Secondary | ICD-10-CM

## 2023-05-12 DIAGNOSIS — E0590 Thyrotoxicosis, unspecified without thyrotoxic crisis or storm: Secondary | ICD-10-CM

## 2023-05-12 DIAGNOSIS — Z3480 Encounter for supervision of other normal pregnancy, unspecified trimester: Secondary | ICD-10-CM

## 2023-05-12 DIAGNOSIS — Z3A33 33 weeks gestation of pregnancy: Secondary | ICD-10-CM

## 2023-05-12 DIAGNOSIS — Z8639 Personal history of other endocrine, nutritional and metabolic disease: Secondary | ICD-10-CM

## 2023-05-12 NOTE — Progress Notes (Unsigned)
   PRENATAL VISIT NOTE: Centering Pregnancy Group 12, Session 8   Subjective:  Erin Bell is a 32 y.o. G3P2002 at [redacted]w[redacted]d being seen today for ongoing prenatal care.  She is currently monitored for the following issues for this high-risk pregnancy and has History of hypothyroidism; History of cesarean delivery; Supervision of other normal pregnancy, antepartum; and Syphilis affecting pregnancy on their problem list.  Patient reports {sx:14538}.   .  .   . Denies leaking of fluid.   The following portions of the patient's history were reviewed and updated as appropriate: allergies, current medications, past family history, past medical history, past social history, past surgical history and problem list.   Objective:   Vitals:   05/12/23 0856  BP: 111/75  Pulse: (!) 103  Weight: 164 lb (74.4 kg)    Fetal Status:           General:  Alert, oriented and cooperative. Patient is in no acute distress.  Skin: Skin is warm and dry. No rash noted.   Cardiovascular: Normal heart rate noted  Respiratory: Normal respiratory effort, no problems with respiration noted  Abdomen: Soft, gravid, appropriate for gestational age.        Pelvic: {Blank single:19197::"Cervical exam performed in the presence of a chaperone","Cervical exam deferred"}        Extremities: Normal range of motion.     Mental Status: Normal mood and affect. Normal behavior. Normal judgment and thought content.   Assessment and Plan:  Pregnancy: G3P2002 at [redacted]w[redacted]d 1. History of cesarean delivery ***  2. Supervision of other normal pregnancy, antepartum ***  3. 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 Please refer to After Visit Summary for other counseling recommendations.   No  follow-ups on file.  Future Appointments  Date Time Provider Department Center  05/26/2023  7:30 AM [facility] NURSE [facility]  05/26/2023  7:45 AM [facility] US4 [facility]  05/26/2023  9:00 AM CENTERING PROVIDER [facility]  06/09/2023  9:00 AM CENTERING PROVIDER [facility]    Federico Flake, MD

## 2023-05-13 ENCOUNTER — Encounter: Payer: Self-pay | Admitting: Family Medicine

## 2023-05-13 DIAGNOSIS — E0590 Thyrotoxicosis, unspecified without thyrotoxic crisis or storm: Secondary | ICD-10-CM | POA: Insufficient documentation

## 2023-05-16 LAB — SPECIMEN STATUS REPORT

## 2023-05-16 LAB — T4, FREE: Free T4: 1.2 ng/dL (ref 0.82–1.77)

## 2023-05-16 LAB — T3 UPTAKE: T3 Uptake Ratio: 16 % — ABNORMAL LOW (ref 24–39)

## 2023-05-17 ENCOUNTER — Encounter: Payer: Self-pay | Admitting: Advanced Practice Midwife

## 2023-05-22 ENCOUNTER — Encounter (HOSPITAL_COMMUNITY): Payer: Self-pay | Admitting: Obstetrics & Gynecology

## 2023-05-22 ENCOUNTER — Inpatient Hospital Stay (HOSPITAL_COMMUNITY)
Admission: AD | Admit: 2023-05-22 | Discharge: 2023-05-22 | Disposition: A | Discharge status: Home / Self Care | Payer: Medicaid Other | Attending: Obstetrics & Gynecology | Admitting: Obstetrics & Gynecology

## 2023-05-22 DIAGNOSIS — O99333 Smoking (tobacco) complicating pregnancy, third trimester: Secondary | ICD-10-CM | POA: Diagnosis not present

## 2023-05-22 DIAGNOSIS — Z3689 Encounter for other specified antenatal screening: Secondary | ICD-10-CM | POA: Diagnosis not present

## 2023-05-22 DIAGNOSIS — J029 Acute pharyngitis, unspecified: Secondary | ICD-10-CM | POA: Diagnosis present

## 2023-05-22 DIAGNOSIS — F17210 Nicotine dependence, cigarettes, uncomplicated: Secondary | ICD-10-CM | POA: Diagnosis not present

## 2023-05-22 DIAGNOSIS — U071 COVID-19: Secondary | ICD-10-CM | POA: Diagnosis not present

## 2023-05-22 DIAGNOSIS — O98513 Other viral diseases complicating pregnancy, third trimester: Secondary | ICD-10-CM | POA: Diagnosis not present

## 2023-05-22 DIAGNOSIS — Z3A34 34 weeks gestation of pregnancy: Secondary | ICD-10-CM | POA: Diagnosis not present

## 2023-05-22 DIAGNOSIS — R519 Headache, unspecified: Secondary | ICD-10-CM | POA: Diagnosis present

## 2023-05-22 DIAGNOSIS — R059 Cough, unspecified: Secondary | ICD-10-CM | POA: Diagnosis present

## 2023-05-22 LAB — RESP PANEL BY RT-PCR (RSV, FLU A&B, COVID)  RVPGX2
Influenza A by PCR: NEGATIVE
Influenza B by PCR: NEGATIVE
Resp Syncytial Virus by PCR: NEGATIVE
SARS Coronavirus 2 by RT PCR: POSITIVE — AB

## 2023-05-22 LAB — URINALYSIS, ROUTINE W REFLEX MICROSCOPIC
Bilirubin Urine: NEGATIVE
Glucose, UA: NEGATIVE mg/dL
Hgb urine dipstick: NEGATIVE
Ketones, ur: NEGATIVE mg/dL
Leukocytes,Ua: NEGATIVE
Nitrite: NEGATIVE
Protein, ur: NEGATIVE mg/dL
Specific Gravity, Urine: 1.003 — ABNORMAL LOW (ref 1.005–1.030)
pH: 7 (ref 5.0–8.0)

## 2023-05-22 LAB — SARS CORONAVIRUS 2 BY RT PCR: SARS Coronavirus 2 by RT PCR: POSITIVE — AB

## 2023-05-22 MED ORDER — PSEUDOEPHEDRINE HCL 60 MG PO TABS: 60.0000 mg | ORAL_TABLET | ORAL | 0 refills | Status: DC | PRN

## 2023-05-22 MED ORDER — GUAIFENESIN 100 MG/5ML PO LIQD: 100.0000 mg | ORAL | 0 refills | Status: DC | PRN

## 2023-05-22 MED ORDER — PSEUDOEPHEDRINE HCL 30 MG PO TABS
60.0000 mg | ORAL_TABLET | Freq: Once | ORAL | Status: AC
  Administered 2023-05-22: 60 mg via ORAL
  Filled 2023-05-22: qty 2

## 2023-05-22 MED ORDER — ACETAMINOPHEN-CAFFEINE 500-65 MG PO TABS
2.0000 | ORAL_TABLET | Freq: Once | ORAL | Status: AC
  Administered 2023-05-22: 2 via ORAL
  Filled 2023-05-22: qty 2

## 2023-05-22 MED ORDER — ACETAMINOPHEN 500 MG PO TABS: 1000.0000 mg | ORAL_TABLET | Freq: Four times a day (QID) | ORAL | 1 refills | Status: DC | PRN

## 2023-05-22 NOTE — Discharge Instructions (Signed)

## 2023-05-22 NOTE — MAU Note (Signed)
.  Erin Bell is a 32 y.o. at [redacted]w[redacted]d here in MAU reporting: she has been having a productive cough, sore throat, and HA for the past two days. She hosted her son's birthday party on Sunday and multiple people from that party have had similar symptoms since then. Has not tested for anything. Her abdominal pain is lower and cramping, but she does notice the pain going up to her right side, sometimes shooting and sharp. Denies VB or LOF. Reports less FM today.  LMP: N/A Onset of complaint: 2 days ago Pain score:  - HA 6/10 - Abd 7/10  Vitals:   05/22/23 1121  BP: 124/72  Pulse: (!) 102  Resp: (!) 22  Temp: 98.1 F (36.7 C)  SpO2: 100%     FHT:150 bpm Lab orders placed from triage:  UA

## 2023-05-22 NOTE — MAU Note (Signed)
RN called microbiology lab to ask about order change from just COVID to COVID plus flu. Lab tech informed RN that they could add on the test and use the current sample in the lab.

## 2023-05-22 NOTE — MAU Provider Note (Signed)
History     CSN: [id number]  Arrival date and time: 05/22/23 1059   Event Date/Time   First Provider Initiated Contact with Patient 05/22/23 1245      Chief Complaint  Patient presents with   Abdominal Pain   Headache   Cough    productive   Sore Throat   Decreased Fetal Movement   Erin Bell is a 32 y.o. [id number] at [redacted]w[redacted]d who receives care at [facility].  She presents today for Flu-Like Symptoms. She reports productive cough, but is unsure of color.  She states her symptoms have been present for ~ 2 days.  She reports HA that is located above right eye and feels like tightening.  She reports it has no relieving or aggravating factors.  She rates the HA a 6/10. Patient also reports lower abdominal pain and cramping. She acknowledges that she has been having BH contractions and notes some movements, getting out of bed/putting on shoes, causes pelvic pain. She reports she had a Bday party on Sunday and now several family members are sick, some with similar symptoms. She is not sure if anyone was tested.   OB History     Gravida  3   Para  2   Term  2   Preterm  0   AB  0   Living  2      SAB  0   IAB  0   Ectopic  0   Multiple  0   Live Births  2        Obstetric Comments  Delivery note attached to prenatal record indicates 2014 low transverse c/s          Past Medical History:  Diagnosis Date   Chlamydia    Depression    Previously treated with Risperdal   Hypothyroidism     Past Surgical History:  Procedure Laterality Date   CESAREAN SECTION N/A 01/15/2013   Procedure: CESAREAN SECTION;  Surgeon: Kathreen Cosier, MD;  Location: [facility];  Service: Obstetrics;  Laterality: N/A;   NOSE SURGERY     WISDOM TOOTH EXTRACTION      Family History  Problem Relation Age of Onset   Arthritis Father    Birth defects Sister        spina bifida   Asthma Brother    Diabetes Maternal Uncle    Thyroid disease Mother    Other Neg Hx    Alcohol abuse Neg  Hx    Hypertension Neg Hx    Hyperlipidemia Neg Hx    Heart disease Neg Hx    Hearing loss Neg Hx    Early death Neg Hx    Drug abuse Neg Hx    Miscarriages / Stillbirths Neg Hx    Mental retardation Neg Hx    Mental illness Neg Hx    Learning disabilities Neg Hx    Kidney disease Neg Hx    Stroke Neg Hx    Vision loss Neg Hx     Social History   Tobacco Use   Smoking status: Every Day    Current packs/day: 0.25    Types: Cigarettes   Smokeless tobacco: Never  Vaping Use   Vaping status: Never Used  Substance Use Topics   Alcohol use: No    Comment: occasional   Drug use: No    Allergies: No Known Allergies  Medications Prior to Admission  Medication Sig Dispense Refill Last Dose   prenatal vitamin w/FE, FA (PRENATAL  1 + 1) 27-1 MG TABS tablet Take 1 tablet by mouth daily at 12 noon. 30 tablet 11 05/22/2023   Blood Pressure Monitoring DEVI 1 each by Does not apply route once a week. 1 each 0    triamcinolone cream (KENALOG) 0.1 % Apply 1 Application topically 2 (two) times daily. Apply to affected areas. 30 g 0     Review of Systems  Constitutional:  Negative for chills and fever.  HENT:  Positive for sore throat. Negative for sinus pressure and sinus pain.   Respiratory:  Positive for cough.   Gastrointestinal:  Positive for nausea. Negative for constipation, diarrhea and vomiting.  Genitourinary:  Negative for difficulty urinating, dysuria, vaginal bleeding and vaginal discharge.  Neurological:  Positive for headaches. Negative for dizziness and light-headedness.   Physical Exam   Blood pressure 117/74, pulse (!) 108, temperature 98.1 F (36.7 C), temperature source Oral, resp. rate (!) 22, height 5\' 2"  (1.575 m), weight 75.6 kg, last menstrual period 09/22/2022, SpO2 100%.  Physical Exam Vitals reviewed.  Constitutional:      Appearance: She is well-developed.  HENT:     Head: Normocephalic and atraumatic.     Nose: Mucosal edema and congestion present.      Right Turbinates: Swollen and pale.     Left Turbinates: Swollen and pale.     Right Sinus: Frontal sinus tenderness present.     Left Sinus: Frontal sinus tenderness present.     Mouth/Throat:     Mouth: Mucous membranes are moist.     Pharynx: Uvula midline. Posterior oropharyngeal erythema present. No uvula swelling.     Tonsils: No tonsillar abscesses.  Eyes:     Conjunctiva/sclera: Conjunctivae normal.  Cardiovascular:     Rate and Rhythm: Normal rate.     Heart sounds: Normal heart sounds.  Pulmonary:     Effort: Pulmonary effort is normal. No respiratory distress.     Breath sounds: Normal breath sounds. No decreased breath sounds or wheezing.  Abdominal:     Tenderness: There is generalized abdominal tenderness.  Musculoskeletal:        General: Normal range of motion.     Cervical back: Normal range of motion.  Skin:    General: Skin is warm and dry.  Neurological:     Mental Status: She is alert and oriented to person, place, and time.  Psychiatric:        Mood and Affect: Mood normal.        Behavior: Behavior normal.     Fetal Assessment 145 bpm, Mod Var, -Decels, +Accels Toco: No ctx graphed  MAU Course   Results for orders placed or performed during the hospital encounter of 05/22/23 (from the past 24 hour(s))  Urinalysis, Routine w reflex microscopic -Urine, Clean Catch     Status: Abnormal   Collection Time: 05/22/23 11:33 AM  Result Value Ref Range   Color, Urine STRAW (A) YELLOW   APPearance CLEAR CLEAR   Specific Gravity, Urine 1.003 (L) 1.005 - 1.030   pH 7.0 5.0 - 8.0   Glucose, UA NEGATIVE NEGATIVE mg/dL   Hgb urine dipstick NEGATIVE NEGATIVE   Bilirubin Urine NEGATIVE NEGATIVE   Ketones, ur NEGATIVE NEGATIVE mg/dL   Protein, ur NEGATIVE NEGATIVE mg/dL   Nitrite NEGATIVE NEGATIVE   Leukocytes,Ua NEGATIVE NEGATIVE  SARS Coronavirus 2 by RT PCR (hospital order, performed in [facility] hospital lab) *cepheid single result test* Anterior Nasal  Swab     Status: Abnormal  Collection Time: 05/22/23 12:30 PM   Specimen: Anterior Nasal Swab  Result Value Ref Range   SARS Coronavirus 2 by RT PCR POSITIVE (A) NEGATIVE  Resp panel by RT-PCR (RSV, Flu A&B, Covid) Anterior Nasal Swab     Status: Abnormal   Collection Time: 05/22/23  1:44 PM   Specimen: Anterior Nasal Swab  Result Value Ref Range   SARS Coronavirus 2 by RT PCR POSITIVE (A) NEGATIVE   Influenza A by PCR NEGATIVE NEGATIVE   Influenza B by PCR NEGATIVE NEGATIVE   Resp Syncytial Virus by PCR NEGATIVE NEGATIVE   No results found.  MDM PE Labs: Covid, UA EFM Prescription Assessment and Plan  32 year old G3P2002  SIUP at 34.4 weeks Cat I FT Covid-Like Symptoms  -POC Reviewed -Exam performed and findings discussed. -Informed that lungs are clear and therefore Chest XR will be deferred.  -Patient agreeable. -Discussed medication for symptoms including sudafed and Excedrin HA. -Oral hydration.  -Monitor and reassess.    Cherre Robins MSN, CNM 05/22/2023, 12:45 PM   Reassessment (2:04 PM) -Patient informed of Covid positive results. -Reiterated quarantine and treating symptoms. -Patient offered and declines Paxlovid prescription. -Will give list of OTC medications. Will also send in script for robitussin, sudafed, and tylenol. -Give OOW note for next 7-10 days.  -Return precautions reviewed. -Encouraged to call primary office or return to MAU if symptoms worsen or with the onset of new symptoms. -Discharged to home in stable condition.  Cherre Robins MSN, CNM Advanced Practice Provider, Center for [employer]

## 2023-05-24 ENCOUNTER — Encounter: Payer: Self-pay | Admitting: Advanced Practice Midwife

## 2023-05-24 ENCOUNTER — Other Ambulatory Visit: Payer: Self-pay | Admitting: Advanced Practice Midwife

## 2023-05-24 MED ORDER — VALACYCLOVIR HCL 500 MG PO TABS: 500.0000 mg | ORAL_TABLET | Freq: Two times a day (BID) | ORAL | 1 refills | Status: DC

## 2023-05-24 NOTE — Progress Notes (Signed)
Prophylaxis for HSV seropositive, no hx of outbreaks.  Valtrex 500 mg BID until delivery sent to pharmacy.

## 2023-05-26 ENCOUNTER — Ambulatory Visit: Payer: Medicaid Other

## 2023-06-06 ENCOUNTER — Encounter: Payer: Self-pay | Admitting: Endocrinology

## 2023-06-06 ENCOUNTER — Ambulatory Visit (INDEPENDENT_AMBULATORY_CARE_PROVIDER_SITE_OTHER): Payer: Medicaid Other | Admitting: Endocrinology

## 2023-06-06 VITALS — BP 100/70 | HR 94 | Ht 62.0 in | Wt 168.2 lb

## 2023-06-06 DIAGNOSIS — E0590 Thyrotoxicosis, unspecified without thyrotoxic crisis or storm: Secondary | ICD-10-CM

## 2023-06-06 DIAGNOSIS — E0500 Thyrotoxicosis with diffuse goiter without thyrotoxic crisis or storm: Secondary | ICD-10-CM | POA: Diagnosis not present

## 2023-06-06 LAB — T4, FREE: Free T4: 1.03 ng/dL (ref 0.60–1.60)

## 2023-06-06 LAB — T3, FREE: T3, Free: 4.6 pg/mL — ABNORMAL HIGH (ref 2.3–4.2)

## 2023-06-06 LAB — TSH: TSH: 0.06 u[IU]/mL — ABNORMAL LOW (ref 0.35–5.50)

## 2023-06-06 NOTE — Progress Notes (Signed)
Outpatient Endocrinology Note [place] Salia Cangemi, MD  06/09/23  Patient's Name: Erin Bell    DOB: 1990-10-22    MRN: [id number]  REASON OF VISIT: New consult for hyperthyroidism  REFERRING PROVIDER: Federico Flake, MD  PCP: Marny Lowenstein, PA-C  HISTORY OF PRESENT ILLNESS:   Erin Bell is a 32 y.o. old female with past medical history as listed below is presented for evaluation of hyperthyroidism.  Patient is pregnant at [redacted] weeks of gestation.  Pertinent Thyroid History: Patient had abnormal thyroid function test with suppressed TSH in March 31, 2023, < 0.005, with normal free T4 of 1.21, repeat lab on May 12, 2023 TSH 0.006, normal free T4 of 1.2, total T4 15.5, free thyroxine index 2.5, T3 uptake ratio 16 and referred to endocrinology for evaluation and management of hyperthyroidism in pregnancy.  Patient is known to have thyroid disorder in the past, in her first pregnancy in 2014/2015 she had hyperthyroidism and was treated with methimazole seems to be until 2016.  In 2018 in her second pregnancy she was treated with levothyroxine and she had taken levothyroxine seems to be on till 2022 per chart review.  Patient has not been on thyroid medication for couple of years and not before and during this pregnancy.  She had normal TSH of 0.529 during pregnancy in March 2024.  Patient denies symptoms of palpitation, heat intolerance, change in bowel habit.  She only complains of fatigue.  No neck discomfort or lump in the neck.  No redness or watering of the eyes.   Labs reviewed   Latest Reference Range & Units 12/16/22 16:31 03/31/23 08:24 05/12/23 09:16  TSH 0.450 - 4.500 uIU/mL CANCELED 0.529 <0.005 (L) 0.006 (L)  T4,Free(Direct) 0.82 - 1.77 ng/dL  4.09 8.11  Thyroxine (T4) 4.5 - 12.0 ug/dL   91.4 (H)  Free Thyroxine Index 1.2 - 4.9    2.5  T3 Uptake Ratio 24 - 39 % 24 - 39 %   16 (L) 16 (L)  (L): Data is abnormally low (H): Data is abnormally high  Interval  history 06/09/23 Patient presented today for further evaluation and management of abnormal thyroid function test with low TSH consistent with subclinical hyperthyroidism.  REVIEW OF SYSTEMS:  As per history of present illness.   PAST MEDICAL HISTORY: Past Medical History:  Diagnosis Date   Chlamydia    Depression    Previously treated with Risperdal   Hypothyroidism     PAST SURGICAL HISTORY: Past Surgical History:  Procedure Laterality Date   CESAREAN SECTION N/A 01/15/2013   Procedure: CESAREAN SECTION;  Surgeon: Kathreen Cosier, MD;  Location: [facility];  Service: Obstetrics;  Laterality: N/A;   NOSE SURGERY     WISDOM TOOTH EXTRACTION      ALLERGIES: No Known Allergies  FAMILY HISTORY:  Family History  Problem Relation Age of Onset   Arthritis Father    Birth defects Sister        spina bifida   Asthma Brother    Diabetes Maternal Uncle    Thyroid disease Mother    Other Neg Hx    Alcohol abuse Neg Hx    Hypertension Neg Hx    Hyperlipidemia Neg Hx    Heart disease Neg Hx    Hearing loss Neg Hx    Early death Neg Hx    Drug abuse Neg Hx    Miscarriages / Stillbirths Neg Hx    Mental retardation Neg Hx    Mental  illness Neg Hx    Learning disabilities Neg Hx    Kidney disease Neg Hx    Stroke Neg Hx    Vision loss Neg Hx     SOCIAL HISTORY: Social History   Socioeconomic History   Marital status: Single    Spouse name: Not on file   Number of children: Not on file   Years of education: Not on file   Highest education level: 12th grade  Occupational History   Not on file  Tobacco Use   Smoking status: Every Day    Current packs/day: 0.25    Types: Cigarettes   Smokeless tobacco: Never  Vaping Use   Vaping status: Never Used  Substance and Sexual Activity   Alcohol use: No    Comment: occasional   Drug use: No   Sexual activity: Yes    Birth control/protection: None  Other Topics Concern   Not on file  Social History Narrative   Not on  file   Social Determinants of Health   Financial Resource Strain: Patient Declined (03/31/2023)   Overall Financial Resource Strain (CARDIA)    Difficulty of Paying Living Expenses: Patient declined  Food Insecurity: Patient Declined (03/31/2023)   Hunger Vital Sign    Worried About Running Out of Food in the Last Year: Patient declined    Ran Out of Food in the Last Year: Patient declined  Transportation Needs: No Transportation Needs (03/31/2023)   PRAPARE - Administrator, Civil Service (Medical): No    Lack of Transportation (Non-Medical): No  Physical Activity: Insufficiently Active (03/31/2023)   Exercise Vital Sign    Days of Exercise per Week: 1 day    Minutes of Exercise per Session: 10 min  Stress: No Stress Concern Present (03/31/2023)   [employer] of Occupational Health - Occupational Stress Questionnaire    Feeling of Stress : Not at all  Social Connections: Not on file    MEDICATIONS:  Current Outpatient Medications  Medication Sig Dispense Refill   Blood Pressure Monitoring DEVI 1 each by Does not apply route once a week. 1 each 0   methimazole (TAPAZOLE) 5 MG tablet Take 1 tablet (5 mg total) by mouth daily. 90 tablet 1   prenatal vitamin w/FE, FA (PRENATAL 1 + 1) 27-1 MG TABS tablet Take 1 tablet by mouth daily at 12 noon. 30 tablet 11   triamcinolone cream (KENALOG) 0.1 % Apply 1 Application topically 2 (two) times daily. Apply to affected areas. 30 g 0   valACYclovir (VALTREX) 500 MG tablet Take 1 tablet (500 mg total) by mouth 2 (two) times daily. 60 tablet 1   No current facility-administered medications for this visit.    PHYSICAL EXAM: Vitals:   06/06/23 0930  BP: 100/70  Pulse: 94  Weight: 168 lb 3.2 oz (76.3 kg)  Height: 5\' 2"  (1.575 m)   Body mass index is 30.76 kg/m.  Wt Readings from Last 3 Encounters:  06/09/23 171 lb (77.6 kg)  06/06/23 168 lb 3.2 oz (76.3 kg)  05/22/23 166 lb 9.6 oz (75.6 kg)     General: Well developed, well  nourished female in no apparent distress.  HEENT: AT/[place], no external lesions. Hearing intact to the spoken word Eyes: EOMI. No stare, proptosis or lid lag. Conjunctiva clear and no icterus. No erythema or watering Neck: Trachea midline, neck supple without appreciable thyromegaly or lymphadenopathy and no palpable thyroid nodules Lungs: Clear to auscultation, no wheeze. Respirations not labored Heart: S1S2,  Regular in rate and rhythm.  Abdomen: Soft, non tender Neurologic: Alert, oriented, normal speech, deep tendon biceps reflexes normal,  no gross focal neurological deficit Extremities: No pedal pitting edema, no tremors of outstretched hands Skin: Warm, color good.  Psychiatric: Does not appear depressed or anxious  PERTINENT HISTORIC LABORATORY AND IMAGING STUDIES:  All pertinent laboratory results were reviewed. Please see HPI also for further details.   TSH  Date Value Ref Range Status  06/06/2023 0.06 (L) 0.35 - 5.50 uIU/mL Final  05/12/2023 0.006 (L) 0.450 - 4.500 uIU/mL Final  03/31/2023 <0.005 (L) 0.450 - 4.500 uIU/mL Final    Lab Results  Component Value Date   FREET4 1.03 06/06/2023   FREET4 1.20 05/12/2023   FREET4 1.21 03/31/2023   T3FREE 4.6 (H) 06/06/2023   T3FREE 3.0 04/08/2014   TSH 0.06 (L) 06/06/2023   TSH 0.006 (L) 05/12/2023   TSH <0.005 (L) 03/31/2023    No results found for: "THYROTRECAB"  Lab Results  Component Value Date   TSH 0.06 (L) 06/06/2023   TSH 0.006 (L) 05/12/2023   TSH <0.005 (L) 03/31/2023   FREET4 1.03 06/06/2023   FREET4 1.20 05/12/2023   FREET4 1.21 03/31/2023     No results found for: "TSI"   No components found for: "TRAB"    ASSESSMENT / PLAN  1. Subclinical hyperthyroidism   2. Hyperthyroidism   3. Graves disease    -Patient had thyroid function test consistent with subclinical hyperthyroidism.  She had elevated total T4 which is appropriate for the pregnancy which is related to high level of estrogen in pregnancy.   Patient is clinically euthyroid today. -Mild subclinical hyperthyroidism does not necessarily need treatment during pregnancy. -With history of thyroid disorder in the past with hypothyroidism in 2014/2016 and hypothyroidism during 2018 indicate that she has tendency to have thyroid disorder.  She will need monitoring of thyroid function test during pregnancy and in postpartum period and after.  Plan: -Check thyroid function test TSH, free T4, free T3 and total T4 -I would like to check thyroid autoantibodies including TRAb/TSI for autoimmune hyperthyroidism, thyroid peroxidase antibody and thyroglobulin antibody for Hashimoto's thyroiditis. -Rest of the management after test results.  Most likely she does not need medication at this time. -I would like to follow-up patient in about 4 to 6 weeks after the delivery.  Patient reports she has EDD around the end of this month.  She is currently 36 weeks of gestation.  Erin Bell was seen today for hyperthyroidism.  Diagnoses and all orders for this visit:  Subclinical hyperthyroidism -     T3, free; Future -     T4, free; Future -     TSH; Future -     TRAb (TSH Receptor Binding Antibody); Future -     Thyroid peroxidase antibody; Future -     Thyroglobulin antibody; Future -     Thyroid stimulating immunoglobulin; Future -     T4; Future -     T4 -     Thyroid stimulating immunoglobulin -     Thyroglobulin antibody -     Thyroid peroxidase antibody -     TRAb (TSH Receptor Binding Antibody) -     TSH -     T4, free -     T3, free  Hyperthyroidism -     T4, free; Future -     T3, free; Future -     TSH; Future  Graves disease -     T4,  free; Future -     T3, free; Future -     TSH; Future  Other orders -     methimazole (TAPAZOLE) 5 MG tablet; Take 1 tablet (5 mg total) by mouth daily.    DISPOSITION Follow up in clinic in 2 months suggested.  All questions answered and patient verbalized understanding of the plan.  [place]  Samanda Buske, MD [facility] Endocrinology [facility] [address] [place], [address] [place], [place] [address] Phone # [phone]   At least part of this note was generated using voice recognition software. Inadvertent word errors may have occurred, which were not recognized during the proofreading process.  Addendum: Labs reviewed elevated free T3 and elevated free T4 with low TSH and positive thyrotropin receptor antibody which is significantly elevated.  She also has mildly elevated thyroglobulin  and thyroid peroxidase antibody.  With a significantly elevated thyrotropin receptor antibody overall consistent with Graves' disease.  I would like to start on antithyroid medication.   Start methimazole 5 mg daily. Check TSH, free T4, free T3 in 2 weeks, and follow-up visit.   Latest Reference Range & Units 06/06/23 09:53  TSH 0.35 - 5.50 uIU/mL 0.06 (L)  Triiodothyronine,Free,Serum 2.3 - 4.2 pg/mL 4.6 (H)  T4,Free(Direct) 0.60 - 1.60 ng/dL 9.52  Thyroxine (T4) 5.1 - 11.9 mcg/dL 84.1 (H)  Thyroglobulin Ab < or = 1 IU/mL 19 (H)  Thyroperoxidase Ab SerPl-aCnc <9 IU/mL 619 (H)  (L): Data is abnormally low (H): Data is abnormally high

## 2023-06-09 ENCOUNTER — Encounter: Payer: Self-pay | Admitting: Advanced Practice Midwife

## 2023-06-09 ENCOUNTER — Ambulatory Visit (HOSPITAL_BASED_OUTPATIENT_CLINIC_OR_DEPARTMENT_OTHER): Payer: Medicaid Other

## 2023-06-09 ENCOUNTER — Other Ambulatory Visit (HOSPITAL_COMMUNITY)
Admission: RE | Admit: 2023-06-09 | Discharge: 2023-06-09 | Disposition: A | Discharge status: Home / Self Care | Payer: Medicaid Other | Source: Ambulatory Visit | Attending: Advanced Practice Midwife | Admitting: Advanced Practice Midwife

## 2023-06-09 ENCOUNTER — Ambulatory Visit: Payer: Medicaid Other | Attending: Obstetrics and Gynecology | Admitting: *Deleted

## 2023-06-09 ENCOUNTER — Ambulatory Visit (INDEPENDENT_AMBULATORY_CARE_PROVIDER_SITE_OTHER): Payer: Medicaid Other | Admitting: Advanced Practice Midwife

## 2023-06-09 VITALS — BP 110/69 | HR 56 | Wt 171.0 lb

## 2023-06-09 DIAGNOSIS — O99283 Endocrine, nutritional and metabolic diseases complicating pregnancy, third trimester: Secondary | ICD-10-CM

## 2023-06-09 DIAGNOSIS — N898 Other specified noninflammatory disorders of vagina: Secondary | ICD-10-CM

## 2023-06-09 DIAGNOSIS — A539 Syphilis, unspecified: Secondary | ICD-10-CM | POA: Diagnosis not present

## 2023-06-09 DIAGNOSIS — O98111 Syphilis complicating pregnancy, first trimester: Secondary | ICD-10-CM

## 2023-06-09 DIAGNOSIS — Z3A37 37 weeks gestation of pregnancy: Secondary | ICD-10-CM

## 2023-06-09 DIAGNOSIS — O4193X0 Disorder of amniotic fluid and membranes, unspecified, third trimester, not applicable or unspecified: Secondary | ICD-10-CM

## 2023-06-09 DIAGNOSIS — Z98891 History of uterine scar from previous surgery: Secondary | ICD-10-CM

## 2023-06-09 DIAGNOSIS — Z3689 Encounter for other specified antenatal screening: Secondary | ICD-10-CM

## 2023-06-09 DIAGNOSIS — O98113 Syphilis complicating pregnancy, third trimester: Secondary | ICD-10-CM

## 2023-06-09 DIAGNOSIS — Z3480 Encounter for supervision of other normal pregnancy, unspecified trimester: Secondary | ICD-10-CM

## 2023-06-09 DIAGNOSIS — O34219 Maternal care for unspecified type scar from previous cesarean delivery: Secondary | ICD-10-CM

## 2023-06-09 DIAGNOSIS — O99891 Other specified diseases and conditions complicating pregnancy: Secondary | ICD-10-CM

## 2023-06-09 DIAGNOSIS — R768 Other specified abnormal immunological findings in serum: Secondary | ICD-10-CM | POA: Diagnosis not present

## 2023-06-09 DIAGNOSIS — E0590 Thyrotoxicosis, unspecified without thyrotoxic crisis or storm: Secondary | ICD-10-CM

## 2023-06-09 DIAGNOSIS — M7918 Myalgia, other site: Secondary | ICD-10-CM

## 2023-06-09 MED ORDER — METHIMAZOLE 5 MG PO TABS: 5.0000 mg | ORAL_TABLET | Freq: Every day | ORAL | 1 refills | Status: DC

## 2023-06-09 NOTE — Addendum Note (Signed)
Addended by: Wylan Gentzler, [place] on: 06/09/2023 10:49 PM   Modules accepted: Orders

## 2023-06-09 NOTE — Progress Notes (Signed)
   PRENATAL VISIT NOTE  Subjective:  Erin Bell is a 32 y.o. G3P2002 at [redacted]w[redacted]d being seen today for ongoing prenatal care.  She is currently monitored for the following issues for this low-risk pregnancy and has History of hypothyroidism; History of cesarean delivery; Supervision of other normal pregnancy, antepartum; Syphilis affecting pregnancy; and Hyperthyroidism affecting pregnancy on their problem list.  Patient reports  pelvic pain/pressure and vaginal discharge with irritation .  Contractions: Irregular. Vag. Bleeding: Scant (spotting after intercourse).  Movement: Present. Denies leaking of fluid.   The following portions of the patient's history were reviewed and updated as appropriate: allergies, current medications, past family history, past medical history, past social history, past surgical history and problem list.   Objective:   Vitals:   06/09/23 0925  BP: 110/69  Pulse: (!) 56  Weight: 171 lb (77.6 kg)    Fetal Status: Fetal Heart Rate (bpm): 145 Fundal Height: 37 cm Movement: Present     General:  Alert, oriented and cooperative. Patient is in no acute distress.  Skin: Skin is warm and dry. No rash noted.   Cardiovascular: Normal heart rate noted  Respiratory: Normal respiratory effort, no problems with respiration noted  Abdomen: Soft, gravid, appropriate for gestational age.  Pain/Pressure: Present     Pelvic: Cervical exam performed in the presence of a chaperone Dilation: 3.5 Effacement (%): 80 Station: -1  Extremities: Normal range of motion.  Edema: None  Mental Status: Normal mood and affect. Normal behavior. Normal judgment and thought content.   Assessment and Plan:  Pregnancy: G3P2002 at [redacted]w[redacted]d 1. Supervision of other normal pregnancy, antepartum --Anticipatory guidance about next visits/weeks of pregnancy given.  --schedule IOL, elective or medical --Message sent to MFM to review hyperthyroid (subclinical), endocrinology visit, and recommendations  for delivery  2. History of VBAC --Desires VBAC this pregnancy, consent signed  3. 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 [redacted] weeks gestation of pregnancy  8. Pain in symphysis pubis during pregnancy --Reviewed exercises for PGP or pubic symphysis pain --PT visit 10/17, likely after pt delivers, may keep if pain persists postpartum --Fetal station low, cervix almost 4 cm dilated --Reviewed IOL with MFM but pt may labor before 39 weeks   Term labor symptoms and general obstetric precautions including but not limited to vaginal bleeding, contractions, leaking of fluid and fetal movement were reviewed in detail with the patient. Please refer to After Visit Summary for other counseling recommendations.   Return in about 1 week (around 06/16/2023) for As scheduled.  Future Appointments  Date Time Provider Department Center  06/09/2023 12:15 PM [facility] NURSE [facility]  06/09/2023 12:30 PM [facility] US6 [facility]  06/16/2023  3:15 PM Adam Phenix, MD [facility]  06/23/2023  2:15 PM Hermina Staggers, MD [facility]  07/17/2023  9:30 AM Theressa Millard, PT [facility]  08/06/2023 10:40 AM [place], [place], MD [facility] None    Sharen Counter, CNM

## 2023-06-10 LAB — CERVICOVAGINAL ANCILLARY ONLY
Bacterial Vaginitis (gardnerella): NEGATIVE
Candida Glabrata: NEGATIVE
Candida Vaginitis: POSITIVE — AB
Chlamydia: NEGATIVE
Comment: NEGATIVE
Comment: NEGATIVE
Comment: NEGATIVE
Comment: NEGATIVE
Comment: NEGATIVE
Comment: NORMAL
Neisseria Gonorrhea: NEGATIVE
Trichomonas: NEGATIVE

## 2023-06-12 ENCOUNTER — Inpatient Hospital Stay (HOSPITAL_COMMUNITY)
Admission: AD | Admit: 2023-06-12 | Discharge: 2023-06-14 | DRG: 776 | Disposition: A | Discharge status: Home / Self Care | Payer: Medicaid Other | Attending: Family Medicine | Admitting: Family Medicine

## 2023-06-12 ENCOUNTER — Other Ambulatory Visit: Payer: Self-pay

## 2023-06-12 ENCOUNTER — Encounter (HOSPITAL_COMMUNITY): Payer: Self-pay | Admitting: Obstetrics and Gynecology

## 2023-06-12 DIAGNOSIS — E0590 Thyrotoxicosis, unspecified without thyrotoxic crisis or storm: Secondary | ICD-10-CM | POA: Diagnosis present

## 2023-06-12 DIAGNOSIS — Z975 Presence of (intrauterine) contraceptive device: Secondary | ICD-10-CM

## 2023-06-12 DIAGNOSIS — O9833 Other infections with a predominantly sexual mode of transmission complicating the puerperium: Secondary | ICD-10-CM | POA: Diagnosis present

## 2023-06-12 DIAGNOSIS — O99335 Smoking (tobacco) complicating the puerperium: Secondary | ICD-10-CM | POA: Diagnosis present

## 2023-06-12 DIAGNOSIS — Z30017 Encounter for initial prescription of implantable subdermal contraceptive: Secondary | ICD-10-CM | POA: Diagnosis not present

## 2023-06-12 DIAGNOSIS — F17210 Nicotine dependence, cigarettes, uncomplicated: Secondary | ICD-10-CM | POA: Diagnosis present

## 2023-06-12 DIAGNOSIS — O34219 Maternal care for unspecified type scar from previous cesarean delivery: Secondary | ICD-10-CM | POA: Diagnosis present

## 2023-06-12 DIAGNOSIS — O99285 Endocrine, nutritional and metabolic diseases complicating the puerperium: Secondary | ICD-10-CM | POA: Diagnosis present

## 2023-06-12 DIAGNOSIS — O99334 Smoking (tobacco) complicating childbirth: Secondary | ICD-10-CM | POA: Diagnosis present

## 2023-06-12 DIAGNOSIS — O99284 Endocrine, nutritional and metabolic diseases complicating childbirth: Secondary | ICD-10-CM | POA: Diagnosis not present

## 2023-06-12 DIAGNOSIS — O34211 Maternal care for low transverse scar from previous cesarean delivery: Secondary | ICD-10-CM | POA: Diagnosis not present

## 2023-06-12 DIAGNOSIS — O9832 Other infections with a predominantly sexual mode of transmission complicating childbirth: Secondary | ICD-10-CM | POA: Diagnosis present

## 2023-06-12 DIAGNOSIS — Z3A37 37 weeks gestation of pregnancy: Secondary | ICD-10-CM

## 2023-06-12 DIAGNOSIS — A6000 Herpesviral infection of urogenital system, unspecified: Secondary | ICD-10-CM | POA: Diagnosis present

## 2023-06-12 DIAGNOSIS — O9812 Syphilis complicating childbirth: Secondary | ICD-10-CM | POA: Diagnosis not present

## 2023-06-12 DIAGNOSIS — E039 Hypothyroidism, unspecified: Secondary | ICD-10-CM | POA: Diagnosis present

## 2023-06-12 LAB — CBC
HCT: 41.1 % (ref 36.0–46.0)
Hemoglobin: 13.7 g/dL (ref 12.0–15.0)
MCH: 28.6 pg (ref 26.0–34.0)
MCHC: 33.3 g/dL (ref 30.0–36.0)
MCV: 85.8 fL (ref 80.0–100.0)
Platelets: 270 10*3/uL (ref 150–400)
RBC: 4.79 MIL/uL (ref 3.87–5.11)
RDW: 14.9 % (ref 11.5–15.5)
WBC: 17.2 10*3/uL — ABNORMAL HIGH (ref 4.0–10.5)
nRBC: 0 % (ref 0.0–0.2)

## 2023-06-12 LAB — THYROGLOBULIN ANTIBODY: Thyroglobulin Ab: 19 [IU]/mL — ABNORMAL HIGH (ref <=1)

## 2023-06-12 LAB — TYPE AND SCREEN
ABO/RH(D): B POS
Antibody Screen: NEGATIVE

## 2023-06-12 LAB — CULTURE, BETA STREP (GROUP B ONLY): Strep Gp B Culture: NEGATIVE

## 2023-06-12 LAB — TRAB (TSH RECEPTOR BINDING ANTIBODY): TRAB: >40 IU/L — ABNORMAL HIGH (ref <=2.00)

## 2023-06-12 LAB — T4: T4, Total: 16.2 ug/dL — ABNORMAL HIGH (ref 5.1–11.9)

## 2023-06-12 LAB — THYROID PEROXIDASE ANTIBODY: Thyroperoxidase Ab SerPl-aCnc: 619 [IU]/mL — ABNORMAL HIGH (ref <9)

## 2023-06-12 LAB — THYROID STIMULATING IMMUNOGLOBULIN: TSI: <89 %{baseline} (ref <140)

## 2023-06-12 MED ORDER — IBUPROFEN 600 MG PO TABS
600.0000 mg | ORAL_TABLET | Freq: Once | ORAL | Status: AC
  Administered 2023-06-12: 600 mg via ORAL

## 2023-06-12 MED ORDER — ACETAMINOPHEN 325 MG PO TABS
650.0000 mg | ORAL_TABLET | ORAL | Status: DC | PRN
  Administered 2023-06-12: 650 mg via ORAL
  Filled 2023-06-12 (×2): qty 2

## 2023-06-12 MED ORDER — DIBUCAINE (PERIANAL) 1 % EX OINT: 1.0000 | TOPICAL_OINTMENT | CUTANEOUS | Status: DC | PRN

## 2023-06-12 MED ORDER — METHIMAZOLE 5 MG PO TABS: 5.0000 mg | ORAL_TABLET | Freq: Every day | ORAL | Status: DC

## 2023-06-12 MED ORDER — ONDANSETRON HCL 4 MG PO TABS
4.0000 mg | ORAL_TABLET | ORAL | Status: DC | PRN
  Administered 2023-06-12: 4 mg via ORAL
  Filled 2023-06-12: qty 1

## 2023-06-12 MED ORDER — OXYTOCIN-SODIUM CHLORIDE 30-0.9 UT/500ML-% IV SOLN: 2.5000 [IU]/h | INTRAVENOUS | Status: DC

## 2023-06-12 MED ORDER — MAGNESIUM HYDROXIDE 400 MG/5ML PO SUSP: 30.0000 mL | ORAL | Status: DC | PRN

## 2023-06-12 MED ORDER — SOD CITRATE-CITRIC ACID 500-334 MG/5ML PO SOLN: 30.0000 mL | ORAL | Status: DC | PRN

## 2023-06-12 MED ORDER — COCONUT OIL OIL: 1.0000 | TOPICAL_OIL | Status: DC | PRN

## 2023-06-12 MED ORDER — IBUPROFEN 600 MG PO TABS
600.0000 mg | ORAL_TABLET | Freq: Four times a day (QID) | ORAL | Status: DC
  Administered 2023-06-12 – 2023-06-14 (×9): 600 mg via ORAL
  Filled 2023-06-12 (×9): qty 1

## 2023-06-12 MED ORDER — ACETAMINOPHEN 325 MG PO TABS: 650.0000 mg | ORAL_TABLET | ORAL | Status: DC | PRN

## 2023-06-12 MED ORDER — ONDANSETRON HCL 4 MG/2ML IJ SOLN: 4.0000 mg | INTRAMUSCULAR | Status: DC | PRN

## 2023-06-12 MED ORDER — OXYCODONE-ACETAMINOPHEN 5-325 MG PO TABS
1.0000 | ORAL_TABLET | ORAL | Status: DC | PRN
  Administered 2023-06-12 – 2023-06-14 (×2): 1 via ORAL
  Filled 2023-06-12 (×2): qty 1

## 2023-06-12 MED ORDER — OXYCODONE-ACETAMINOPHEN 5-325 MG PO TABS
1.0000 | ORAL_TABLET | ORAL | Status: DC | PRN
  Administered 2023-06-12: 1 via ORAL
  Filled 2023-06-12: qty 1

## 2023-06-12 MED ORDER — OXYTOCIN 10 UNIT/ML IJ SOLN
10.0000 [IU] | Freq: Once | INTRAMUSCULAR | Status: AC
  Administered 2023-06-12: 10 [IU] via INTRAMUSCULAR

## 2023-06-12 MED ORDER — BENZOCAINE-MENTHOL 20-0.5 % EX AERO
1.0000 | INHALATION_SPRAY | CUTANEOUS | Status: DC | PRN
  Administered 2023-06-12: 1 via TOPICAL
  Filled 2023-06-12: qty 56

## 2023-06-12 MED ORDER — LACTATED RINGERS IV SOLN: INTRAVENOUS | Status: DC

## 2023-06-12 MED ORDER — DIPHENHYDRAMINE HCL 25 MG PO CAPS: 25.0000 mg | ORAL_CAPSULE | Freq: Four times a day (QID) | ORAL | Status: DC | PRN

## 2023-06-12 MED ORDER — OXYCODONE-ACETAMINOPHEN 5-325 MG PO TABS: 2.0000 | ORAL_TABLET | ORAL | Status: DC | PRN

## 2023-06-12 MED ORDER — PRENATAL MULTIVITAMIN CH
1.0000 | ORAL_TABLET | Freq: Every day | ORAL | Status: DC
  Administered 2023-06-13 – 2023-06-14 (×2): 1 via ORAL
  Filled 2023-06-12 (×2): qty 1

## 2023-06-12 MED ORDER — OXYCODONE-ACETAMINOPHEN 5-325 MG PO TABS
2.0000 | ORAL_TABLET | ORAL | Status: DC | PRN
  Administered 2023-06-13 – 2023-06-14 (×6): 2 via ORAL
  Filled 2023-06-12 (×6): qty 2

## 2023-06-12 MED ORDER — LIDOCAINE HCL (PF) 1 % IJ SOLN: 30.0000 mL | INTRAMUSCULAR | Status: DC | PRN

## 2023-06-12 MED ORDER — TETANUS-DIPHTH-ACELL PERTUSSIS 5-2.5-18.5 LF-MCG/0.5 IM SUSY: 0.5000 mL | PREFILLED_SYRINGE | Freq: Once | INTRAMUSCULAR | Status: DC

## 2023-06-12 MED ORDER — WITCH HAZEL-GLYCERIN EX PADS
1.0000 | MEDICATED_PAD | CUTANEOUS | Status: DC | PRN
  Administered 2023-06-14: 1 via TOPICAL

## 2023-06-12 MED ORDER — SIMETHICONE 80 MG PO CHEW: 80.0000 mg | CHEWABLE_TABLET | ORAL | Status: DC | PRN

## 2023-06-12 MED ORDER — OXYTOCIN BOLUS FROM INFUSION: 333.0000 mL | Freq: Once | INTRAVENOUS | Status: DC

## 2023-06-12 MED ORDER — MEASLES, MUMPS & RUBELLA VAC IJ SOLR: 0.5000 mL | Freq: Once | INTRAMUSCULAR | Status: DC

## 2023-06-12 MED ORDER — ONDANSETRON HCL 4 MG/2ML IJ SOLN: 4.0000 mg | Freq: Four times a day (QID) | INTRAMUSCULAR | Status: DC | PRN

## 2023-06-12 MED ORDER — LACTATED RINGERS IV SOLN: 500.0000 mL | INTRAVENOUS | Status: DC | PRN

## 2023-06-12 NOTE — H&P (Signed)
HPI: Erin Bell is a 32 y.o. year old G28P3003 female at [redacted]w[redacted]d weeks gestation who presents to [facility] after delivering en rout in EMS. Placenta undelivered. Infant vigorous upon arrival to [facility].    Pregnancy significant for Hx Syphilis.  Negative RPR within 1 year (01/2022 at [facility] see Care Everywhere) PCN x 1 dose recommended. Dose given by [facility] 06/2022 and second dose given when pt initiated prenatal care on 01/06/2023 Titer 1:64 at time of first PCN dose, 06/2022, down to 1:4 on 11/2022 and 03/2023 showing adequate response to treatment.  Hyperthyroid on Methimazole->Suppressed. Methimazole D/C'd August 2024.   Hx C/S x 1 for breech followed by successful TOLAC. Planned TOLAC. Consent under media.     Nursing Staff Provider  Office Location [facility] Dating  06/29/2023, by Last Menstrual Period  [facility] Model [ ]  Traditional [ X] Centering [ ]  Mom-Baby Dyad    Language  English Anatomy US  Normal - incomplete - repeat scheduled  Flu Vaccine  Decline-2*29*24 Genetic/Carrier Screen  NIPS:   LR female AFP:   screen neg Horizon:4/4 negative  TDaP Vaccine   03/17/23 Hgb A1C or  GTT Early  Third trimester   COVID Vaccine No   LAB RESULTS   Rhogam  B/Positive/-- (03/18 1631)  Blood Type B/Positive/-- (03/18 1631)   Baby Feeding Plan Breast/ Bottle Antibody Negative (03/18 1631)  Contraception PP Nexplanon Rubella 3.00 (03/18 1631)  Circumcision Yes RPR Reactive (03/18 1631) pos T pal, previous 1:64 titer  Pediatrician  [facility] HBsAg Negative (03/18 1631)   Support Person Sister/Mom HCVAb Non Reactive (03/18 1631)   Prenatal Classes  HIV Non Reactive (03/18 1631)     BTL Consent NA GBS   (For PCN allergy, check sensitivities)   VBAC Consent  Pap Diagnosis  Date Value Ref Range Status  12/16/2022   Final   - Negative for intraepithelial lesion or malignancy (NILM)         DME Rx Arly.Keller ] BP cuff [ ]  Weight Scale Waterbirth  [ ]  Class [ ]  Consent [ ]  CNM visit  PHQ9 &  GAD7 [  ] new OB [  ] 28 weeks  [  ] 36 weeks Induction  [ ]  Orders Entered [ ] Foley Y/N    OB History     Gravida  3   Para  3   Term  3   Preterm  0   AB  0   Living  3      SAB  0   IAB  0   Ectopic  0   Multiple  0   Live Births  3        Obstetric Comments  Delivery note attached to prenatal record indicates 2014 low transverse c/s         Past Medical History:  Diagnosis Date   Chlamydia    Depression    Previously treated with Risperdal   Hypothyroidism    Past Surgical History:  Procedure Laterality Date   CESAREAN SECTION N/A 01/15/2013   Procedure: CESAREAN SECTION;  Surgeon: Kathreen Cosier, MD;  Location: [facility];  Service: Obstetrics;  Laterality: N/A;   NOSE SURGERY     WISDOM TOOTH EXTRACTION     Family History: family history includes Arthritis in her father; Asthma in her brother; Birth defects in her sister; Diabetes in her maternal uncle; Thyroid disease in her mother. Social History:  reports that she has been smoking cigarettes. She  has never used smokeless tobacco. She reports that she does not drink alcohol and does not use drugs.     Maternal Diabetes: No Genetic Screening: Normal Maternal Ultrasounds/Referrals: Normal Fetal Ultrasounds or other Referrals:  Referred to Materal Fetal Medicine  Maternal Substance Abuse:  No Significant Maternal Medications:  Meds include: Other: Methimazole Significant Maternal Lab Results:  Other:  Number of Prenatal Visits:greater than 3 verified prenatal visits Other Comments:   + Syphilis adequately Tx. Titers dropped appropriately. Hyperthyroid on Methimazole.   Review of Systems  Constitutional:  Negative for chills and fever.  Eyes:  Negative for photophobia.  Genitourinary:  Negative for vaginal bleeding.  Neurological:  Negative for headaches.   Maternal Medical History:  Reason for admission: Delivery en rout on EMS  Prenatal complications: No PIH or IUGR.   Prenatal  Complications - Diabetes: none.     Blood pressure 128/70, pulse 97, temperature 99.3 F (37.4 C), temperature source Oral, resp. rate 20, last menstrual period 09/22/2022, SpO2 100%, unknown if currently breastfeeding. Maternal Exam:  Introitus: Normal vulva. Vulva is negative for lesion.  Amniotic fluid character: clear. No lacterations Cervix: Cervix evaluated by digital exam.     Physical Exam Vitals reviewed. Exam conducted with a chaperone present.  Constitutional:      General: She is in acute distress.     Appearance: She is not ill-appearing or toxic-appearing.  HENT:     Head: Normocephalic.  Eyes:     Conjunctiva/sclera: Conjunctivae normal.  Cardiovascular:     Rate and Rhythm: Normal rate.  Pulmonary:     Effort: Pulmonary effort is normal. No respiratory distress.  Abdominal:     Tenderness: There is no abdominal tenderness. There is no guarding.  Genitourinary:    General: Normal vulva.  Vulva is no lesion.  Musculoskeletal:     Right lower leg: No edema.     Left lower leg: No edema.  Skin:    General: Skin is warm and dry.  Neurological:     General: No focal deficit present.     Mental Status: She is alert and oriented to person, place, and time.  Psychiatric:        Mood and Affect: Mood normal.     Prenatal labs: ABO, Rh: B/Positive/-- (03/18 1631) Antibody: Negative (03/18 1631) Rubella: 3.00 (03/18 1631) RPR: Reactive (07/01 0824)  HBsAg: Negative (03/18 1631)  HIV: Non Reactive (07/01 0824)  GBS:   Pending  Assessment: 1. Precipitous term delivery en route to hospital by EMS. Placenta undelivered on arrival. Nml bleeding.   2. Fetal Wellbeing: Category NA  3. Pain Control: None 4. GBS: Pending 5. 37.4 week IUP 6. Hyperthyroid, previously on Methimazole, D/C'd.   Plan:  1. Admit to MD per consult with MD 2. Routine Postpartum orders 3. CBC, RPR, T&S drawn.  4. Peds informed of Hx Syphilis.  5. GBS pending, no Tx. Will follow and  let Peds know if positive.  6. F/U w/ endo PP  Dorathy Kinsman 06/12/2023, 3:42 PM

## 2023-06-12 NOTE — Discharge Summary (Signed)
Postpartum Discharge Summary    Patient Name: Erin Bell DOB: [birth year] MRN: [id number]  Date of admission: 06/12/2023 Delivery date:06/12/2023 Delivering provider:   Date of discharge: 06/14/2023  Admitting diagnosis: Indication for care in labor or delivery [O75.9] Vaginal delivery [O80] Intrauterine pregnancy: [redacted]w[redacted]d     Secondary diagnosis:  Principal Problem:   Indication for care in labor or delivery Active Problems:   Nexplanon in place   Vaginal delivery  Additional problems: Tpal pending for syphilis     Discharge diagnosis: VBAC                                              Post partum procedures: n/a Augmentation: N/A Complications: None  Hospital course: Onset of Labor With Vaginal Delivery      32 y.o. yo [id number] at [redacted]w[redacted]d was admitted post delivery in EMS on 06/12/2023. Labor course was complicated by n/a  Membrane Rupture Time/Date:  ,   Delivery Method:VBAC, Spontaneous Operative Delivery:N/A Episiotomy: None Lacerations:  None Patient had a postpartum course complicated by n/a.  She is ambulating, tolerating a regular diet, passing flatus, and urinating well. Patient is discharged home in stable condition on 06/14/23.  Newborn Data: Birth date:06/12/2023 Birth time:11:57 AM Gender:Female Living status:Living Apgars: ,  Weight:2740 g  Magnesium Sulfate received: No BMZ received: No Rhophylac:N/A MMR:N/A T-DaP:Given prenatally Flu: No Transfusion:No  Physical exam  Vitals:   06/13/23 0409 06/13/23 1549 06/13/23 2038 06/14/23 0530  BP: (!) 101/54 100/60 (!) 101/51 95/65  Pulse: 72 71 63 77  Resp: 18 17 16 18   Temp: 98.4 F (36.9 C) 98.6 F (37 C) 98.2 F (36.8 C) 98.4 F (36.9 C)  TempSrc: Oral Oral Oral Oral  SpO2: 100% 99% 100% 100%   General: alert, cooperative, and no distress Lochia: appropriate Uterine Fundus: firm Incision: N/A DVT Evaluation: No evidence of DVT seen on physical exam. Labs: Lab Results  Component Value Date    WBC 17.2 (H) 06/12/2023   HGB 13.7 06/12/2023   HCT 41.1 06/12/2023   MCV 85.8 06/12/2023   PLT 270 06/12/2023      Latest Ref Rng & Units 01/21/2010    7:22 AM  CMP  Total Protein 6.0 - 8.3 g/dL 6.8   Total Bilirubin 0.3 - 1.2 mg/dL 0.3   Alkaline Phos 47 - 119 U/L 80   AST 0 - 37 U/L 16   ALT 0 - 35 U/L 17    Edinburgh Score:    06/13/2023    2:40 PM  Edinburgh Postnatal Depression Scale Screening Tool  I have been able to laugh and see the funny side of things. 0  I have looked forward with enjoyment to things. 0  I have blamed myself unnecessarily when things went wrong. 0  I have been anxious or worried for no good reason. 0  I have felt scared or panicky for no good reason. 0  Things have been getting on top of me. 0  I have been so unhappy that I have had difficulty sleeping. 0  I have felt sad or miserable. 0  I have been so unhappy that I have been crying. 0  The thought of harming myself has occurred to me. 0  Edinburgh Postnatal Depression Scale Total 0     After visit meds:  Allergies as of 06/14/2023   No  Known Allergies      Medication List     TAKE these medications    Blood Pressure Monitoring Devi 1 each by Does not apply route once a week.   docusate sodium 100 MG capsule Commonly known as: Colace Take 1 capsule (100 mg total) by mouth 2 (two) times daily.   methimazole 5 MG tablet Commonly known as: TAPAZOLE Take 1 tablet (5 mg total) by mouth daily.   prenatal vitamin w/FE, FA 27-1 MG Tabs tablet Take 1 tablet by mouth daily at 12 noon.   triamcinolone cream 0.1 % Commonly known as: KENALOG Apply 1 Application topically 2 (two) times daily. Apply to affected areas.   valACYclovir 500 MG tablet Commonly known as: Valtrex Take 1 tablet (500 mg total) by mouth 2 (two) times daily.         Discharge home in stable condition Infant Feeding: Bottle and Breast Infant Disposition:home with mother Discharge instruction: per After  Visit Summary and Postpartum booklet. Activity: Advance as tolerated. Pelvic rest for 6 weeks.  Diet: routine diet Future Appointments: Future Appointments  Date Time Provider Department Center  06/16/2023  3:15 PM Adam Phenix, MD [facility]  06/19/2023 11:20 AM Thapa, [place], MD [facility] None  06/23/2023  2:15 PM Hermina Staggers, MD [facility]  07/17/2023  9:30 AM Theressa Millard, PT [facility]  08/06/2023 10:40 AM Thapa, [place], MD [facility] None   Follow up Visit: Sent message 9/14 [facility]   Please schedule this patient for a In person postpartum visit in 6 weeks with the following provider: Any provider. Additional Postpartum F/U: Syphilis labs    Low risk pregnancy complicated by:  Syphilis  Delivery mode:  VBAC, Spontaneous Anticipated Birth Control:  Nexplanon   06/14/2023 Hessie Dibble, MD

## 2023-06-13 ENCOUNTER — Encounter (HOSPITAL_COMMUNITY): Payer: Self-pay | Admitting: Family Medicine

## 2023-06-13 DIAGNOSIS — Z30017 Encounter for initial prescription of implantable subdermal contraceptive: Secondary | ICD-10-CM

## 2023-06-13 HISTORY — PX: INSERTION OF IMPLANON ROD: OBO 1005

## 2023-06-13 LAB — RPR
RPR Ser Ql: REACTIVE — AB
RPR Titer: 1:2 {titer}

## 2023-06-13 MED ORDER — ETONOGESTREL 68 MG ~~LOC~~ IMPL
68.0000 mg | DRUG_IMPLANT | Freq: Once | SUBCUTANEOUS | Status: AC
  Administered 2023-06-13: 68 mg via SUBCUTANEOUS
  Filled 2023-06-13: qty 1

## 2023-06-13 MED ORDER — METHIMAZOLE 5 MG PO TABS
5.0000 mg | ORAL_TABLET | Freq: Every day | ORAL | Status: DC
  Administered 2023-06-13 – 2023-06-14 (×2): 5 mg via ORAL
  Filled 2023-06-13 (×2): qty 1

## 2023-06-13 MED ORDER — LIDOCAINE HCL 1 % IJ SOLN
0.0000 mL | Freq: Once | INTRAMUSCULAR | Status: AC | PRN
  Administered 2023-06-13: 20 mL via INTRADERMAL
  Filled 2023-06-13: qty 20

## 2023-06-13 NOTE — Progress Notes (Signed)
Daily Post Partum Note  06/13/2023 Erin Bell is a 32 y.o. [id number] PPD#1 s/p VBAC/intact perineum at noon at [redacted]w[redacted]d; pt delivered in EMS  Pregnancy c/b h/o syphilis, h/o hyperthyroidism (followed by endo), h/o HSV 24hr/overnight events:  none  Subjective:  Meeting all PP goals.   Objective:    Current Vital Signs 24h Vital Sign Ranges  T 98.4 F (36.9 C) Temp  Avg: 98.7 F (37.1 C)  Min: 97.9 F (36.6 C)  Max: 99.3 F (37.4 C)  BP (!) 101/54 BP  Min: 101/54  Max: 135/92  HR 72 Pulse  Avg: 92.1  Min: 72  Max: 103  RR 18 Resp  Avg: 17.6  Min: 16  Max: 20  SaO2 100 %   SpO2  Avg: 99.8 %  Min: 99 %  Max: 100 %       24 Hour I/O Current Shift I/O  Time Ins Outs No intake/output data recorded. No intake/output data recorded.    General: NAD Abdomen: soft, nttp.  Perineum: deferred Skin:  Warm and dry.  Respiratory:  Normal respiratory effort  Medications Current Facility-Administered Medications  Medication Dose Route Frequency Provider Last Rate Last Admin   acetaminophen (TYLENOL) tablet 650 mg  650 mg Oral Q4H PRN Katrinka Blazing, [place], CNM   650 mg at 06/12/23 1820   benzocaine-Menthol (DERMOPLAST) 20-0.5 % topical spray 1 Application  1 Application Topical PRN Katrinka Blazing, [place], CNM   1 Application at 06/12/23 1820   coconut oil  1 Application Topical PRN Katrinka Blazing, [place], CNM       witch hazel-glycerin (TUCKS) pad 1 Application  1 Application Topical PRN Katrinka Blazing, [place], CNM       And   dibucaine (NUPERCAINAL) 1 % rectal ointment 1 Application  1 Application Rectal PRN Katrinka Blazing, [place], CNM       diphenhydrAMINE (BENADRYL) capsule 25 mg  25 mg Oral Q6H PRN Katrinka Blazing, [place], CNM       etonogestrel (NEXPLANON) implant 68 mg  68 mg Subdermal Once [place] Bing, MD       ibuprofen (ADVIL) tablet 600 mg  600 mg Oral Q6H Smith, [place], CNM   600 mg at 06/13/23 0424   lidocaine (XYLOCAINE) 1 % (with pres) injection 0-20 mL  0-20 mL Intradermal Once PRN [place] Bing, MD        magnesium hydroxide (MILK OF MAGNESIA) suspension 30 mL  30 mL Oral Q3 days PRN Katrinka Blazing, [place], CNM       measles, mumps & rubella vaccine (MMR) injection 0.5 mL  0.5 mL Subcutaneous Once [place], [place], CNM       ondansetron [facility]) tablet 4 mg  4 mg Oral Q4H PRN Katrinka Blazing, [place], CNM   4 mg at 06/12/23 1956   Or   ondansetron (ZOFRAN) injection 4 mg  4 mg Intravenous Q4H PRN Katrinka Blazing, [place], CNM       oxyCODONE-acetaminophen (PERCOCET/ROXICET) 5-325 MG per tablet 1 tablet  1 tablet Oral Q4H PRN Katrinka Blazing, [place], CNM   1 tablet at 06/12/23 1956   oxyCODONE-acetaminophen (PERCOCET/ROXICET) 5-325 MG per tablet 2 tablet  2 tablet Oral Q4H PRN Katrinka Blazing, [place], CNM   2 tablet at 06/13/23 0424   prenatal multivitamin tablet 1 tablet  1 tablet Oral Q1200 Smith, [place], CNM       simethicone [facility]) chewable tablet 80 mg  80 mg Oral PRN Katrinka Blazing, [place], CNM       Tdap (BOOSTRIX) injection 0.5 mL  0.5 mL Intramuscular Once [place], [place], CNM  Labs:  Recent Labs  Lab 06/12/23 1600  WBC 17.2*  HGB 13.7  HCT 41.1  PLT 270    Assessment & Plan:  Patient doing well *Postpartum/postop: routine care. B POS\rpr +, tpal pending/boy, circ-yes & patient consented/breast/ f/u re: birth control tomorrow *Early latent syphilis: s/p PCN x 1 during pregnancy. Nothing to do currently.  06/2022: 1:64 + tpal 07/2022: PCN x 1 11/2022: 1:4, +tpal 03/2023: 1:4, +tpal 03/2023: PCN x 1 06/12/2023: 1:2, tpal pending *Hyperthyroidism: home mmz 5mg  qday restarted *Dispo: likely tomorrow.   Cornelia Copa MD Attending Center for [facility])

## 2023-06-13 NOTE — Lactation Note (Addendum)
This note was copied from a baby's chart. Lactation Consultation Note  Patient Name: Erin Bell [id number] Date: 06/13/2023 Age:30 hours Reason for consult: Initial assessment;Early term 37-38.6wks  Visited P3 parent of early term infant for initial assessment. Birth Parent delivered en route via EMS. Reviewed doc flow sheets - baby just returned from circumcision and just fed. Birth Parent uninterested in latching baby at this time. Birth Parent has WIC and has a wearable breast pump and is not interested in setting up or obtaining a DEBP.   Reviewed doc flowsheets with family - WNL for age.  Birth Parent breastfed her other two children for 3 months and then moved to formula feeding and is comfortable with combo feeding and supplementation guidelines. Birth Parent fine to be seen PRN.   Maternal Data Has patient been taught Hand Expression?: Yes Does the patient have breastfeeding experience prior to this delivery?: Yes How long did the patient breastfeed?: 3 months for both kids.  Feeding Mother's Current Feeding Choice: Breast Milk and Formula  Interventions Interventions: Breast feeding basics reviewed;LC Services brochure;Guidelines for Milk Supply and Pumping Schedule Handout;CDC Guidelines for Breast Pump Cleaning  Discharge Pump: Hands Free;Personal WIC Program: Yes  Consult Status Consult Status: Complete (mother declined follow up) Date: 06/13/23    Antionette Char 06/13/2023, 12:16 PM

## 2023-06-13 NOTE — Procedures (Signed)
Nexplanon Insertion Procedure Note Prior to the procedure being performed, the patient (or guardian) was asked to state their full name, date of birth, type of procedure being performed and the exact location of the operative site. This information was then checked against the documentation in the patient's chart. Prior to the procedure being performed, a "time out" was performed by the physician that confirmed the correct patient, procedure and site.  After informed consent was obtained, the patient's non-dominant left arm was chosen for insertion at the old insertion site. A site was approximately 8 cm proximal to the medial epicondyle in the sulcus between the biceps and triceps on the inner surface. The area was cleaned with alcohol then local anesthesia was infiltrated with 3 ml of 1% lidocaine along the planned insertion track. The area was prepped with betadine. Using sterile technique the Nexplanon device was inserted per manufacturer's guidelines in the subdermal connective tissue using the standard insertion technique without difficulty. Pressure was applied and the insertion site was hemostatic. The presence of the Nexplanon was confirmed immediately after insertion by palpation by both me and the patient and by checking the tip of needle for the absence of the insert.  A pressure dressing was applied.  Lot #:  [id number] Exp: 06/2025  The patient tolerated the procedure well.  Cornelia Copa MD Attending Center for [employer] [facility])

## 2023-06-14 ENCOUNTER — Encounter (HOSPITAL_COMMUNITY): Payer: Self-pay | Admitting: Family Medicine

## 2023-06-14 MED ORDER — DOCUSATE SODIUM 100 MG PO CAPS: 100.0000 mg | ORAL_CAPSULE | Freq: Two times a day (BID) | ORAL | 0 refills | Status: DC

## 2023-06-14 NOTE — Progress Notes (Signed)
MOB was referred for history of depression.  * Referral screened out by Clinical Social Worker because none of the following criteria appear to apply:  ~ History of depression during this pregnancy, or of post-partum depression following prior delivery.  ~ Diagnosis of depression within last 3 years  Per OB notes, MOB did not indicate any signs/ symptoms during pregnancy. Depression dates back to 2018  OR  * MOB's symptoms currently being treated with medication and/or therapy.  Please contact the Clinical Social Worker if needs arise, by [facility] request, or if MOB scores greater than 9/yes to question 10 on Edinburgh Postpartum Depression Screen.  Enos Fling, Theresia Majors Clinical Social Worker [phone]

## 2023-06-15 LAB — T.PALLIDUM AB, TOTAL: T Pallidum Abs: REACTIVE — AB

## 2023-06-16 ENCOUNTER — Encounter: Payer: Medicaid Other | Admitting: Obstetrics & Gynecology

## 2023-06-17 ENCOUNTER — Encounter: Payer: Self-pay | Admitting: Advanced Practice Midwife

## 2023-06-19 ENCOUNTER — Encounter: Payer: Self-pay | Admitting: Endocrinology

## 2023-06-19 ENCOUNTER — Ambulatory Visit (INDEPENDENT_AMBULATORY_CARE_PROVIDER_SITE_OTHER): Payer: Medicaid Other | Admitting: Endocrinology

## 2023-06-19 VITALS — BP 140/70 | HR 92 | Resp 16 | Ht 62.0 in | Wt 161.6 lb

## 2023-06-19 DIAGNOSIS — E0500 Thyrotoxicosis with diffuse goiter without thyrotoxic crisis or storm: Secondary | ICD-10-CM

## 2023-06-19 DIAGNOSIS — E0590 Thyrotoxicosis, unspecified without thyrotoxic crisis or storm: Secondary | ICD-10-CM

## 2023-06-19 DIAGNOSIS — Z0289 Encounter for other administrative examinations: Secondary | ICD-10-CM

## 2023-06-19 NOTE — Patient Instructions (Addendum)
Continue methimazole 5mg  daily. Okay to breast feed.   Lab in 1 months, few days prior to follow up visit.

## 2023-06-19 NOTE — Progress Notes (Signed)
Outpatient Endocrinology Note [place] Dashauna Heymann, MD  06/19/23  Patient's Name: Erin Bell    DOB: 04/12/1991    MRN: [id number]  REASON OF VISIT: Follow-up for hyperthyroidism  REFERRING PROVIDER: Federico Flake, MD  PCP: Marny Lowenstein, PA-C  HISTORY OF PRESENT ILLNESS:   [name] is a 32 y.o. old female with past medical history as listed below is follow-up for hyperthyroidism / Graves' disease.    Pertinent Thyroid History: Patient had abnormal thyroid function test with suppressed TSH in March 31, 2023, < 0.005, with normal free T4 of 1.21, repeat lab on May 12, 2023 TSH 0.006, normal free T4 of 1.2, total T4 15.5, free thyroxine index 2.5, T3 uptake ratio 16 and referred to endocrinology for evaluation and management of hyperthyroidism in pregnancy.  Patient is known to have thyroid disorder in the past, in her first pregnancy in 2014/2015 she had hyperthyroidism and was treated with methimazole seems to be until 2016.  In 2018 in her second pregnancy she was treated with levothyroxine and she had taken levothyroxine seems to be on till 2022 per chart review.  Patient has not been on thyroid medication for couple of years and not before and during this pregnancy.  She had normal TSH of 0.529 during pregnancy in March 2024.  In June 06, 2023 she had elevated free T3, total T4, normal free T4 and low TSH with significantly elevated thyrotropin receptor antibody, mildly elevated thyroglobulin antibody and thyroid peroxidase antibody overall consistent with Graves' disease.   Latest Reference Range & Units 06/06/23 09:53  TSI <140 % baseline <89  TRAB <=2.00 IU/L >40.00 (H)  (H): Data is abnormally high  Patient was started on methimazole 5 mg daily.  Interval history 06/19/23 Patient had early delivery 1 week ago, has been currently taking methimazole 5 mg daily.  Patient is currently on postpartum.  She has been breast-feeding the baby.  She denies palpitation or  heat intolerance.  No other complaints today.  Patient reports her baby is being evaluated by pediatric endocrinology due to significantly elevated thyrotropin receptor antibody for hyperthyroidism, had seen yesterday and had done blood work.  REVIEW OF SYSTEMS:  As per history of present illness.   PAST MEDICAL HISTORY: Past Medical History:  Diagnosis Date   Chlamydia    Depression    Previously treated with Risperdal   Hypothyroidism     PAST SURGICAL HISTORY: Past Surgical History:  Procedure Laterality Date   CESAREAN SECTION N/A 01/15/2013   Procedure: CESAREAN SECTION;  Surgeon: Kathreen Cosier, MD;  Location: [facility];  Service: Obstetrics;  Laterality: N/A;   INSERTION OF IMPLANON ROD  06/13/2023   NOSE SURGERY     WISDOM TOOTH EXTRACTION      ALLERGIES: No Known Allergies  FAMILY HISTORY:  Family History  Problem Relation Age of Onset   Arthritis Father    Birth defects Sister        spina bifida   Asthma Brother    Diabetes Maternal Uncle    Thyroid disease Mother    Other Neg Hx    Alcohol abuse Neg Hx    Hypertension Neg Hx    Hyperlipidemia Neg Hx    Heart disease Neg Hx    Hearing loss Neg Hx    Early death Neg Hx    Drug abuse Neg Hx    Miscarriages / Stillbirths Neg Hx    Mental retardation Neg Hx    Mental illness Neg  Hx    Learning disabilities Neg Hx    Kidney disease Neg Hx    Stroke Neg Hx    Vision loss Neg Hx     SOCIAL HISTORY: Social History   Socioeconomic History   Marital status: Single    Spouse name: Not on file   Number of children: Not on file   Years of education: Not on file   Highest education level: 12th grade  Occupational History   Not on file  Tobacco Use   Smoking status: Every Day    Current packs/day: 0.25    Types: Cigarettes   Smokeless tobacco: Never  Vaping Use   Vaping status: Never Used  Substance and Sexual Activity   Alcohol use: No    Comment: occasional   Drug use: No   Sexual activity: Yes     Birth control/protection: None  Other Topics Concern   Not on file  Social History Narrative   Not on file   Social Determinants of Health   Financial Resource Strain: Patient Declined (03/31/2023)   Overall Financial Resource Strain (CARDIA)    Difficulty of Paying Living Expenses: Patient declined  Food Insecurity: Patient Declined (03/31/2023)   Hunger Vital Sign    Worried About Running Out of Food in the Last Year: Patient declined    Ran Out of Food in the Last Year: Patient declined  Transportation Needs: No Transportation Needs (03/31/2023)   PRAPARE - Administrator, Civil Service (Medical): No    Lack of Transportation (Non-Medical): No  Physical Activity: Insufficiently Active (03/31/2023)   Exercise Vital Sign    Days of Exercise per Week: 1 day    Minutes of Exercise per Session: 10 min  Stress: No Stress Concern Present (03/31/2023)   [employer] of Occupational Health - Occupational Stress Questionnaire    Feeling of Stress : Not at all  Social Connections: Not on file    MEDICATIONS:  Current Outpatient Medications  Medication Sig Dispense Refill   Blood Pressure Monitoring DEVI 1 each by Does not apply route once a week. 1 each 0   docusate sodium (COLACE) 100 MG capsule Take 1 capsule (100 mg total) by mouth 2 (two) times daily. 10 capsule 0   methimazole (TAPAZOLE) 5 MG tablet Take 1 tablet (5 mg total) by mouth daily. 90 tablet 1   prenatal vitamin w/FE, FA (PRENATAL 1 + 1) 27-1 MG TABS tablet Take 1 tablet by mouth daily at 12 noon. 30 tablet 11   triamcinolone cream (KENALOG) 0.1 % Apply 1 Application topically 2 (two) times daily. Apply to affected areas. 30 g 0   valACYclovir (VALTREX) 500 MG tablet Take 1 tablet (500 mg total) by mouth 2 (two) times daily. 60 tablet 1   No current facility-administered medications for this visit.    PHYSICAL EXAM: Vitals:   06/19/23 1122  BP: (!) 140/70  Pulse: 92  Resp: 16  SpO2: 99%  Weight: 161  lb 9.6 oz (73.3 kg)  Height: 5\' 2"  (1.575 m)   Body mass index is 29.56 kg/m.  Wt Readings from Last 3 Encounters:  06/19/23 161 lb 9.6 oz (73.3 kg)  06/09/23 171 lb (77.6 kg)  06/06/23 168 lb 3.2 oz (76.3 kg)     General: Well developed, well nourished female in no apparent distress.  HEENT: AT/[place], no external lesions. Hearing intact to the spoken word Eyes: EOMI. No stare, proptosis or lid lag. Conjunctiva clear and no icterus. No erythema  or watering Neck: Trachea midline, neck supple without appreciable thyromegaly or lymphadenopathy and no palpable thyroid nodules Lungs: Clear to auscultation, no wheeze. Respirations not labored Heart: S1S2, Regular in rate and rhythm.  Abdomen: Soft, non tender Neurologic: Alert, oriented, normal speech, deep tendon biceps reflexes normal,  no gross focal neurological deficit Extremities: No pedal pitting edema, no tremors of outstretched hands Skin: Warm, color good.  Psychiatric: Does not appear depressed or anxious  PERTINENT HISTORIC LABORATORY AND IMAGING STUDIES:  All pertinent laboratory results were reviewed. Please see HPI also for further details.   TSH  Date Value Ref Range Status  06/06/2023 0.06 (L) 0.35 - 5.50 uIU/mL Final  05/12/2023 0.006 (L) 0.450 - 4.500 uIU/mL Final  03/31/2023 <0.005 (L) 0.450 - 4.500 uIU/mL Final    Lab Results  Component Value Date   FREET4 1.03 06/06/2023   FREET4 1.20 05/12/2023   FREET4 1.21 03/31/2023   T3FREE 4.6 (H) 06/06/2023   T3FREE 3.0 04/08/2014   TSH 0.06 (L) 06/06/2023   TSH 0.006 (L) 05/12/2023   TSH <0.005 (L) 03/31/2023    No results found for: "THYROTRECAB"  Lab Results  Component Value Date   TSH 0.06 (L) 06/06/2023   TSH 0.006 (L) 05/12/2023   TSH <0.005 (L) 03/31/2023   FREET4 1.03 06/06/2023   FREET4 1.20 05/12/2023   FREET4 1.21 03/31/2023     Lab Results  Component Value Date   TSI <89 06/06/2023     No components found for: "TRAB"    ASSESSMENT /  PLAN  1. Graves disease   2. Hyperthyroidism    -Patient has on and off thyroid disorder from 2014/2016, she was treated for hypothyroidism and hyperthyroidism in the past.  Now patient has hyperthyroidism with significantly elevated thyrotropin receptor antibody consistent with Graves' disease.  She was started on methimazole 5 mg daily, currently taking the same dose from first week of September 2024.  -Patient is currently postpartum had a baby 1 week ago.  Plan: -Continue methimazole 5 mg daily. -Too early to check the lab today.  Will check thyroid function test in 1 month, 1 week prior to visit.  Diagnoses and all orders for this visit:  Graves disease -     T3, free; Future -     T4, free; Future -     TSH; Future  Hyperthyroidism   DISPOSITION Follow up in clinic in 5 - 6 weeks suggested.  All questions answered and patient verbalized understanding of the plan.  [place] Nekeisha Aure, MD [facility] Endocrinology [facility] [address] [place], [address] [place], [place] [address] Phone # [phone]   At least part of this note was generated using voice recognition software. Inadvertent word errors may have occurred, which were not recognized during the proofreading process.

## 2023-06-22 ENCOUNTER — Telehealth: Payer: Medicaid Other | Admitting: Family

## 2023-06-22 DIAGNOSIS — H60503 Unspecified acute noninfective otitis externa, bilateral: Secondary | ICD-10-CM | POA: Diagnosis not present

## 2023-06-22 MED ORDER — OFLOXACIN 0.3 % OT SOLN: 5.0000 [drp] | Freq: Every day | OTIC | 0 refills | Status: DC

## 2023-06-22 NOTE — Progress Notes (Signed)
E Visit for Ear Pain - Swimmer's Ear  We are sorry that you are not feeling well. Here is how we plan to help!  Based on what you have shared with me it looks like you have Swimmer's Ear.  Swimmer's ear is a redness or swelling, irritation, or infection of your outer ear canal. These symptoms usually occur within a few days of swimming. Your ear canal is a tube that goes from the opening of the ear to the eardrum.  When water stays in your ear canal, germs can grow.  This is a painful condition that often happens to children and swimmers of all ages.  It is not contagious and oral antibiotics are not required to treat uncomplicated swimmer's ear.  The usual symptoms include:    Itchiness inside the ear  Redness or a sense of swelling in the ear  Pain when the ear is tugged on when pressure is placed on the ear  Pus draining from the infected ear     I have prescribed: Ofloxcin that you place 5 drops in both ears.    In certain cases, swimmer's ear may progress to a more serious bacterial infection of the middle or inner ear.  If you have a fever 102 and up and significantly worsening symptoms, this could indicate a more serious infection moving to the middle/inner and needs face to face evaluation in an office by a provider.  Your symptoms should improve over the next 3 days and should resolve in about 7 days.  Be sure to complete ALL of your prescription.  HOME CARE: Wash your hands frequently. If you are prescribed an ear drop, do not place the tip of the bottle on your ear or touch it with your fingers. You can take Acetaminophen 650 mg every 4-6 hours as needed for pain.  If pain is severe or moderate, you can apply a heating pad (set on low) or hot water bottle (wrapped in a towel) to outer ear for 20 minutes.  This will also increase drainage. Avoid ear plugs Do not go swimming until the symptoms are gone Do not use Q-tips After showers, help the water run out by tilting your head  to one side.   GET HELP RIGHT AWAY IF: Fever is over 102.2 degrees. You develop progressive ear pain or hearing loss. Ear symptoms persist longer than 3 days after treatment.  MAKE SURE YOU: Understand these instructions. Will watch your condition. Will get help right away if you are not doing well or get worse.  TO PREVENT SWIMMER'S EAR: Use a bathing cap or custom fitted swim molds to keep your ears dry. Towel off after swimming to dry your ears. Tilt your head or pull your earlobes to allow the water to escape your ear canal. If there is still water in your ears, consider using a hairdryer on the lowest setting.  Thank you for choosing an e-visit.  Your e-visit answers were reviewed by a board certified advanced clinical practitioner to complete your personal care plan. Depending upon the condition, your plan could have included both over the counter or prescription medications.  Please review your pharmacy choice. Make sure the pharmacy is open so you can pick up the prescription now. If there is a problem, you may contact your provider through [employer] and have the prescription routed to another pharmacy.  Your safety is important to [place]. If you have drug allergies check your prescription carefully.   For the next 24  hours you can use MyChart to ask questions about today's visit, request a non-urgent call back, or ask for a work or school excuse. You will get an email with a survey after your eVisit asking about your experience. We would appreciate your feedback. I hope that your e-visit has been valuable and will aid in your recovery.  Approximately 5 minutes was spent documenting and reviewing patient's chart.

## 2023-06-23 ENCOUNTER — Encounter: Payer: Medicaid Other | Admitting: Obstetrics and Gynecology

## 2023-06-24 ENCOUNTER — Telehealth: Payer: Medicaid Other | Admitting: Physician Assistant

## 2023-06-24 DIAGNOSIS — H9203 Otalgia, bilateral: Secondary | ICD-10-CM

## 2023-06-24 NOTE — Progress Notes (Signed)
Because of the way you describe your symptoms with the ear discharge, I feel your condition warrants further evaluation and I recommend that you be seen in a face to face visit. It is possible it is ear wax impaction, and being seen in person they can clean out your ears. It is also possible you may have a ruptured ear drum due to the discharge. Both can be determined with having someone look in your ears.   NOTE: There will be NO CHARGE for this eVisit   If you are having a true medical emergency please call 911.      For an urgent face to face visit, [place] has eight urgent care centers for your convenience:   NEW!! [facility] Health Urgent Care Center at [facility] Get Driving Directions [phone] [address] [place], [address]    [facility] Health Urgent Care Center at [facility] Get Driving Directions [phone] [address] [place], [place] [address]   [facility] Health Urgent Care Center [facility]) Get Driving Directions [phone] [address] [place], [place] [address]  [facility] Health Urgent Care Center [facility]) Get Driving Directions [phone] [address] [place],  [place]  [address]  [facility] Health Urgent Care Center [facility] - at [employer]  [phone] [address] [place],  [place] [address]   [facility] Health Urgent Care at [facility] Get Driving Directions [phone] [address] [place], [place] [address]   [facility] Health Urgent Care at [facility] Get Driving Directions  [phone] [address] [place], [place] [address]   [facility] Health Urgent Care at [facility] Directions [phone] [address] [place], [place] [address]  Your MyChart E-visit questionnaire answers were reviewed by a board certified advanced clinical practitioner to complete your personal care plan based on  your specific symptoms.  Thank you for using e-Visits.   I have spent 5 minutes in review of e-visit questionnaire, review and updating patient chart, medical decision making and response to patient.   Margaretann Loveless, PA-C

## 2023-07-06 ENCOUNTER — Encounter (HOSPITAL_COMMUNITY): Payer: Medicaid Other

## 2023-07-11 ENCOUNTER — Telehealth (HOSPITAL_COMMUNITY): Payer: Self-pay | Admitting: *Deleted

## 2023-07-11 NOTE — Telephone Encounter (Signed)
07/11/2023  Name: Erin Bell MRN: [id number] DOB: 04-13-1991  Reason for Call:  Transition of Care Hospital Discharge Call  Contact Status: Patient Contact Status: Complete  Language assistant needed: Interpreter Mode: Interpreter Not Needed        Follow-Up Questions: Do You Have Any Concerns About Your Health As You Heal From Delivery?: No Do You Have Any Concerns About Your Infants Health?: No  Edinburgh Postnatal Depression Scale:  In the Past 7 Days: I have been able to laugh and see the funny side of things.: As much as I always could I have looked forward with enjoyment to things.: As much as I ever did I have blamed myself unnecessarily when things went wrong.: Yes, some of the time I have been anxious or worried for no good reason.: Yes, sometimes I have felt scared or panicky for no good reason.: No, not much Things have been getting on top of me.: No, I have been coping as well as ever I have been so unhappy that I have had difficulty sleeping.: Not at all I have felt sad or miserable.: Not very often I have been so unhappy that I have been crying.: No, never The thought of harming myself has occurred to me.: Never Edinburgh Postnatal Depression Scale Total: 6  PHQ2-9 Depression Scale:     Discharge Follow-up: Edinburgh score requires follow up?: No Patient was advised of the following resources:: Breastfeeding Support Group, Support Group  Post-discharge interventions: Reviewed Newborn Safe Sleep Practices  Salena Saner, RN 07/11/2023 13:47

## 2023-07-17 ENCOUNTER — Encounter: Payer: Medicaid Other | Attending: Advanced Practice Midwife | Admitting: Physical Therapy

## 2023-07-28 ENCOUNTER — Other Ambulatory Visit (INDEPENDENT_AMBULATORY_CARE_PROVIDER_SITE_OTHER): Payer: Medicaid Other

## 2023-07-28 DIAGNOSIS — E0500 Thyrotoxicosis with diffuse goiter without thyrotoxic crisis or storm: Secondary | ICD-10-CM

## 2023-07-28 LAB — T3, FREE: T3, Free: 3 pg/mL (ref 2.3–4.2)

## 2023-07-28 LAB — T4, FREE: Free T4: 0.66 ng/dL (ref 0.60–1.60)

## 2023-07-28 LAB — TSH: TSH: 0.07 u[IU]/mL — ABNORMAL LOW (ref 0.35–5.50)

## 2023-07-30 ENCOUNTER — Other Ambulatory Visit: Payer: Self-pay

## 2023-07-30 ENCOUNTER — Ambulatory Visit: Payer: Medicaid Other | Admitting: Obstetrics & Gynecology

## 2023-07-30 ENCOUNTER — Encounter: Payer: Self-pay | Admitting: Obstetrics & Gynecology

## 2023-07-30 DIAGNOSIS — A539 Syphilis, unspecified: Secondary | ICD-10-CM | POA: Diagnosis not present

## 2023-07-30 DIAGNOSIS — Z975 Presence of (intrauterine) contraceptive device: Secondary | ICD-10-CM

## 2023-07-30 NOTE — Progress Notes (Signed)
Post Partum Visit Note  Erin Bell is a 32 y.o. G50P3003 female who presents for a postpartum visit. She is 6 weeks postpartum following a normal spontaneous vaginal delivery.  I have fully reviewed the prenatal and intrapartum course. The delivery was at [redacted]w[redacted]d gestational weeks.  Anesthesia: none. Postpartum course has been uncomplicated. Baby is doing well. Baby is feeding by both breast and bottle - Similac . Bleeding no bleeding. Bowel function is normal. Bladder function is normal. Patient is not sexually active. Contraception method is Nexplanon. Postpartum depression screening: negative.   Upstream - 07/30/23 1015       Pregnancy Intention Screening   Does the patient want to become pregnant in the next year? No    Does the patient's partner want to become pregnant in the next year? No    Would the patient like to discuss contraceptive options today? No      Contraception Wrap Up   Current Method Hormonal Implant    End Method Hormonal Implant    Contraception Counseling Provided No            The pregnancy intention screening data noted above was reviewed. Potential methods of contraception were discussed. The patient elected to proceed with Hormonal Implant. This was received in hospital prior to discharge.   Edinburgh Postnatal Depression Scale - 07/30/23 1015       Edinburgh Postnatal Depression Scale:  In the Past 7 Days   I have been able to laugh and see the funny side of things. 0    I have looked forward with enjoyment to things. 0    I have blamed myself unnecessarily when things went wrong. 2    I have been anxious or worried for no good reason. 1    I have felt scared or panicky for no good reason. 1    Things have been getting on top of me. 0    I have been so unhappy that I have had difficulty sleeping. 0    I have felt sad or miserable. 2    I have been so unhappy that I have been crying. 0    The thought of harming myself has occurred to me. 0     Edinburgh Postnatal Depression Scale Total 6             Health Maintenance Due  Topic Date Due   INFLUENZA VACCINE  Never done   COVID-19 Vaccine (1 - 2023-24 season) Never done    The following portions of the patient's history were reviewed and updated as appropriate: allergies, current medications, past family history, past medical history, past social history, past surgical history, and problem list.  Review of Systems Pertinent items noted in HPI and remainder of comprehensive ROS otherwise negative.  Objective:  BP 116/75   Pulse (!) 47   Wt 158 lb (71.7 kg)   LMP 09/22/2022   Breastfeeding Yes   BMI 28.90 kg/m    General:  alert and no distress   Breasts:  normal, done in presence of chaperone  Lungs: clear to auscultation bilaterally  Heart:  regular rate and rhythm  Abdomen: soft, non-tender; bowel sounds normal; no masses,  no organomegaly   GU exam:  not indicated       Assessment:   Normal postpartum exam.   Plan:   Essential components of care per ACOG recommendations:  1.  Mood and well being: Patient with negative depression screening today. Reviewed local resources  for support.  - Patient tobacco use? No.   - hx of drug use? No.    2. Infant care and feeding:  -Patient currently breastmilk feeding? Yes. Reviewed importance of draining breast regularly to support lactation.  -Social determinants of health (SDOH) reviewed in EPIC. No concerns.  3. Sexuality, contraception and birth spacing - Patient does not want a pregnancy in the next year.  Desired family size is 3 children.  - Nexplanon placed postpartum, she is good for four years. - Discussed birth spacing of 18 months  4. Sleep and fatigue -Encouraged family/partner/community support of 4 hrs of uninterrupted sleep to help with mood and fatigue  5. Physical Recovery  - Discussed patients delivery and complications. She describes her labor as fast and good. - Patient had a Vaginal, no  problems at delivery. Patient had no laceration. Perineal healing reviewed. Patient expressed understanding - Patient has urinary incontinence? No. - Patient is safe to resume physical and sexual activity.  Recommended  condoms for STI protection.  6.  Health Maintenance - HM due items addressed Yes - Last pap smear  Diagnosis  Date Value Ref Range Status  12/16/2022   Final   - Negative for intraepithelial lesion or malignancy (NILM)   Pap smear not done at today's visit.   7. Chronic Disease/Pregnancy Condition follow up:  Syphilis - RPR titer 1:2, good evidence of treatment, no further intervention needed. - PCP follow up   Jaynie Collins, MD Center for [facility], [facility]

## 2023-08-01 ENCOUNTER — Other Ambulatory Visit: Payer: Medicaid Other

## 2023-08-06 ENCOUNTER — Encounter: Payer: Self-pay | Admitting: Endocrinology

## 2023-08-06 ENCOUNTER — Ambulatory Visit (INDEPENDENT_AMBULATORY_CARE_PROVIDER_SITE_OTHER): Payer: Medicaid Other | Admitting: Endocrinology

## 2023-08-06 VITALS — BP 112/70 | HR 92 | Resp 16 | Ht 62.0 in | Wt 158.2 lb

## 2023-08-06 DIAGNOSIS — E0500 Thyrotoxicosis with diffuse goiter without thyrotoxic crisis or storm: Secondary | ICD-10-CM | POA: Diagnosis not present

## 2023-08-06 NOTE — Progress Notes (Signed)
Outpatient Endocrinology Note [place] Phelan Schadt, MD  08/06/23  Patient's Name: Erin Bell    DOB: October 25, 1990    MRN: [id number]  REASON OF VISIT: Follow-up for hyperthyroidism  REFERRING PROVIDER: Federico Flake, MD  PCP: Marny Lowenstein, PA-C  HISTORY OF PRESENT ILLNESS:   Erin Bell is a 32 y.o. old female with past medical history as listed below is follow-up for hyperthyroidism / Graves' disease.    Pertinent Thyroid History: - Patient is known to have thyroid disorder in the past, in her first pregnancy in 2014/2015 she had hyperthyroidism and was treated with methimazole seems to be until 2016.  In 2018 in her second pregnancy she was treated with levothyroxine and she had taken levothyroxine seems to be on till 2022 per chart review.  Patient has not been on thyroid medication for couple of years.   - Patient developed hyperthyroidism in the third trimester of pregnancy, with significantly elevated thyrotropin receptor antibody and started on methimazole 5 mg daily in September 2024.    Interval history 08/06/23 Patient has been taking methimazole 5 mg daily since September.  She had baby in the middle of September.  Methimazole was started in the late third trimester of pregnancy.  She denies palpitation or heat intolerance.  Overall normal energy.  No change in bowel habit.  No recent illness or any GI issues.  Recent thyroid lab with normalization of free T4 and free T3.  TSH is still low.   Latest Reference Range & Units 07/28/23 09:16  TSH 0.35 - 5.50 uIU/mL 0.07 (L)  Triiodothyronine,Free,Serum 2.3 - 4.2 pg/mL 3.0  T4,Free(Direct) 0.60 - 1.60 ng/dL 4.09  (L): Data is abnormally low  REVIEW OF SYSTEMS:  As per history of present illness.   PAST MEDICAL HISTORY: Past Medical History:  Diagnosis Date   Chlamydia    Depression    Previously treated with Risperdal   History of hypothyroidism 03/18/2014   Hypothyroid in previous pregnancy, TSH Q trimester,  normal on 12/16/22     Hypothyroidism    Syphilis     PAST SURGICAL HISTORY: Past Surgical History:  Procedure Laterality Date   CESAREAN SECTION N/A 01/15/2013   Procedure: CESAREAN SECTION;  Surgeon: Kathreen Cosier, MD;  Location: [facility];  Service: Obstetrics;  Laterality: N/A;   INSERTION OF IMPLANON ROD  06/13/2023   NOSE SURGERY     WISDOM TOOTH EXTRACTION      ALLERGIES: No Known Allergies  FAMILY HISTORY:  Family History  Problem Relation Age of Onset   Arthritis Father    Birth defects Sister        spina bifida   Asthma Brother    Diabetes Maternal Uncle    Thyroid disease Mother    Other Neg Hx    Alcohol abuse Neg Hx    Hypertension Neg Hx    Hyperlipidemia Neg Hx    Heart disease Neg Hx    Hearing loss Neg Hx    Early death Neg Hx    Drug abuse Neg Hx    Miscarriages / Stillbirths Neg Hx    Mental retardation Neg Hx    Mental illness Neg Hx    Learning disabilities Neg Hx    Kidney disease Neg Hx    Stroke Neg Hx    Vision loss Neg Hx     SOCIAL HISTORY: Social History   Socioeconomic History   Marital status: Single    Spouse name: Not on file  Number of children: Not on file   Years of education: Not on file   Highest education level: 12th grade  Occupational History   Not on file  Tobacco Use   Smoking status: Every Day    Current packs/day: 0.25    Types: Cigarettes   Smokeless tobacco: Never  Vaping Use   Vaping status: Never Used  Substance and Sexual Activity   Alcohol use: No    Comment: occasional   Drug use: No   Sexual activity: Yes    Birth control/protection: None  Other Topics Concern   Not on file  Social History Narrative   Not on file   Social Determinants of Health   Financial Resource Strain: Patient Declined (03/31/2023)   Overall Financial Resource Strain (CARDIA)    Difficulty of Paying Living Expenses: Patient declined  Food Insecurity: Patient Declined (03/31/2023)   Hunger Vital Sign    Worried About  Running Out of Food in the Last Year: Patient declined    Ran Out of Food in the Last Year: Patient declined  Transportation Needs: No Transportation Needs (03/31/2023)   PRAPARE - Administrator, Civil Service (Medical): No    Lack of Transportation (Non-Medical): No  Physical Activity: Insufficiently Active (03/31/2023)   Exercise Vital Sign    Days of Exercise per Week: 1 day    Minutes of Exercise per Session: 10 min  Stress: No Stress Concern Present (03/31/2023)   [employer] of Occupational Health - Occupational Stress Questionnaire    Feeling of Stress : Not at all  Social Connections: Not on file    MEDICATIONS:  Current Outpatient Medications  Medication Sig Dispense Refill   Blood Pressure Monitoring DEVI 1 each by Does not apply route once a week. 1 each 0   docusate sodium (COLACE) 100 MG capsule Take 1 capsule (100 mg total) by mouth 2 (two) times daily. 10 capsule 0   methimazole (TAPAZOLE) 5 MG tablet Take 1 tablet (5 mg total) by mouth daily. 90 tablet 1   ofloxacin (FLOXIN) 0.3 % OTIC solution Place 5 drops into both ears daily. 5 mL 0   prenatal vitamin w/FE, FA (PRENATAL 1 + 1) 27-1 MG TABS tablet Take 1 tablet by mouth daily at 12 noon. 30 tablet 11   triamcinolone cream (KENALOG) 0.1 % Apply 1 Application topically 2 (two) times daily. Apply to affected areas. 30 g 0   No current facility-administered medications for this visit.    PHYSICAL EXAM: Vitals:   08/06/23 1046  BP: 112/70  Pulse: 92  Resp: 16  SpO2: 98%  Weight: 158 lb 3.2 oz (71.8 kg)  Height: 5\' 2"  (1.575 m)   Body mass index is 28.94 kg/m.  Wt Readings from Last 3 Encounters:  08/06/23 158 lb 3.2 oz (71.8 kg)  07/30/23 158 lb (71.7 kg)  06/19/23 161 lb 9.6 oz (73.3 kg)     General: Well developed, well nourished female in no apparent distress.  HEENT: AT/[place], no external lesions. Hearing intact to the spoken word Eyes: EOMI. No stare, proptosis or lid lag. Conjunctiva clear  and no icterus. No erythema or watering Neck: Trachea midline, neck supple without appreciable thyromegaly or lymphadenopathy and no palpable thyroid nodules Neurologic: Alert, oriented, normal speech, deep tendon biceps reflexes normal,  no gross focal neurological deficit Extremities: No pedal pitting edema, no tremors of outstretched hands Skin: Warm, color good.  Psychiatric: Does not appear depressed or anxious  PERTINENT HISTORIC LABORATORY AND  IMAGING STUDIES:  All pertinent laboratory results were reviewed. Please see HPI also for further details.   TSH  Date Value Ref Range Status  07/28/2023 0.07 (L) 0.35 - 5.50 uIU/mL Final  06/06/2023 0.06 (L) 0.35 - 5.50 uIU/mL Final  05/12/2023 0.006 (L) 0.450 - 4.500 uIU/mL Final    Lab Results  Component Value Date   FREET4 0.66 07/28/2023   FREET4 1.03 06/06/2023   FREET4 1.20 05/12/2023   T3FREE 3.0 07/28/2023   T3FREE 4.6 (H) 06/06/2023   T3FREE 3.0 04/08/2014   TSH 0.07 (L) 07/28/2023   TSH 0.06 (L) 06/06/2023   TSH 0.006 (L) 05/12/2023    No results found for: "THYROTRECAB"  Lab Results  Component Value Date   TSH 0.07 (L) 07/28/2023   TSH 0.06 (L) 06/06/2023   TSH 0.006 (L) 05/12/2023   FREET4 0.66 07/28/2023   FREET4 1.03 06/06/2023   FREET4 1.20 05/12/2023     Lab Results  Component Value Date   TSI <89 06/06/2023     No components found for: "TRAB"    ASSESSMENT / PLAN  1. Graves disease    -  -Patient was on and off thyroid disorder from 2014/2016, she was treated for hypothyroidism and hyperthyroidism in the past.   -Patient developed hypothyroidism with significantly elevated thyrotropin receptor antibody, consistent with Graves' disease and methimazole was restarted in the late third trimester of pregnancy.  She had delivery in mid September. -She is currently taking methimazole 5 mg daily.  It was started in beginning of September 2024. -Recent thyroid lab with normal free T4 and free T3.  TSH is  still low however it lags behind and takes time to get normal. -She is currently taking methimazole 5 mg daily.  Plan: -Continue methimazole 5 mg daily.  Okay to breast-feed while taking methimazole. -Check thyroid function test in 3 months.  Diagnoses and all orders for this visit:  Graves disease -     T4, free; Future -     TSH; Future -     T3, free; Future    DISPOSITION Follow up in clinic in 3 months suggested.  All questions answered and patient verbalized understanding of the plan.  [place] Nadia Torr, MD [facility] [address] [place], [address] [place], [place] [address] Phone # [phone]   At least part of this note was generated using voice recognition software. Inadvertent word errors may have occurred, which were not recognized during the proofreading process.

## 2023-09-11 ENCOUNTER — Other Ambulatory Visit: Payer: Self-pay | Admitting: Endocrinology

## 2023-09-30 ENCOUNTER — Telehealth: Payer: Medicaid Other | Admitting: Nurse Practitioner

## 2023-09-30 DIAGNOSIS — T3695XA Adverse effect of unspecified systemic antibiotic, initial encounter: Secondary | ICD-10-CM | POA: Diagnosis not present

## 2023-09-30 DIAGNOSIS — B9689 Other specified bacterial agents as the cause of diseases classified elsewhere: Secondary | ICD-10-CM | POA: Diagnosis not present

## 2023-09-30 DIAGNOSIS — N760 Acute vaginitis: Secondary | ICD-10-CM

## 2023-09-30 DIAGNOSIS — B379 Candidiasis, unspecified: Secondary | ICD-10-CM

## 2023-09-30 MED ORDER — METRONIDAZOLE 500 MG PO TABS
500.0000 mg | ORAL_TABLET | Freq: Two times a day (BID) | ORAL | 0 refills | Status: AC
Start: 2023-09-30 — End: 2023-10-07

## 2023-09-30 MED ORDER — FLUCONAZOLE 150 MG PO TABS
150.0000 mg | ORAL_TABLET | ORAL | 0 refills | Status: DC | PRN
Start: 2023-09-30 — End: 2023-11-06

## 2023-09-30 NOTE — Progress Notes (Signed)
 E-Visit for Vaginal Symptoms  We are sorry that you are not feeling well. Here is how we plan to help! Based on what you shared with me it looks like you: May have a vaginosis due to bacteria  Vaginosis is an inflammation of the vagina that can result in discharge, itching and pain. The cause is usually a change in the normal balance of vaginal bacteria or an infection. Vaginosis can also result from reduced estrogen levels after menopause.  The most common causes of vaginosis are:   Bacterial vaginosis which results from an overgrowth of one on several organisms that are normally present in your vagina.   Yeast infections which are caused by a naturally occurring fungus called candida.   Vaginal atrophy (atrophic vaginosis) which results from the thinning of the vagina from reduced estrogen levels after menopause.   Trichomoniasis which is caused by a parasite and is commonly transmitted by sexual intercourse.  Factors that increase your risk of developing vaginosis include: Medications, such as antibiotics and steroids Uncontrolled diabetes Use of hygiene products such as bubble bath, vaginal spray or vaginal deodorant Douching Wearing damp or tight-fitting clothing Using an intrauterine device (IUD) for birth control Hormonal changes, such as those associated with pregnancy, birth control pills or menopause Sexual activity Having a sexually transmitted infection  Your treatment plan is Metronidazole or Flagyl 500mg  twice a day for 7 days.  I have electronically sent this prescription into the pharmacy that you have chosen.  Diflucan given as prophylaxis as patient tends to get vaginal yeast infections with antibiotic use.  Be sure to take all of the medication as directed. Stop taking any medication if you develop a rash, tongue swelling or shortness of breath. Mothers who are breast feeding should consider pumping and discarding their breast milk while on these antibiotics.  However, there is no consensus that infant exposure at these doses would be harmful.  Remember that medication creams can weaken latex condoms. Marland Kitchen   HOME CARE:  Good hygiene may prevent some types of vaginosis from recurring and may relieve some symptoms:  Avoid baths, hot tubs and whirlpool spas. Rinse soap from your outer genital area after a shower, and dry the area well to prevent irritation. Don't use scented or harsh soaps, such as those with deodorant or antibacterial action. Avoid irritants. These include scented tampons and pads. Wipe from front to back after using the toilet. Doing so avoids spreading fecal bacteria to your vagina.  Other things that may help prevent vaginosis include:  Don't douche. Your vagina doesn't require cleansing other than normal bathing. Repetitive douching disrupts the normal organisms that reside in the vagina and can actually increase your risk of vaginal infection. Douching won't clear up a vaginal infection. Use a latex condom. Both female and female latex condoms may help you avoid infections spread by sexual contact. Wear cotton underwear. Also wear pantyhose with a cotton crotch. If you feel comfortable without it, skip wearing underwear to bed. Yeast thrives in [employer] Your symptoms should improve in the next day or two.  GET HELP RIGHT AWAY IF:  You have pain in your lower abdomen ( pelvic area or over your ovaries) You develop nausea or vomiting You develop a fever Your discharge changes or worsens You have persistent pain with intercourse You develop shortness of breath, a rapid pulse, or you faint.  These symptoms could be signs of problems or infections that need to be evaluated by a medical provider now.  MAKE SURE YOU   Understand these instructions. Will watch your condition. Will get help right away if you are not doing well or get worse.  Thank you for choosing an e-visit.  Your e-visit answers were reviewed by a  board certified advanced clinical practitioner to complete your personal care plan. Depending upon the condition, your plan could have included both over the counter or prescription medications.  Please review your pharmacy choice. Make sure the pharmacy is open so you can pick up prescription now. If there is a problem, you may contact your provider through [employer] and have the prescription routed to another pharmacy.  Your safety is important to [place]. If you have drug allergies check your prescription carefully.   For the next 24 hours you can use MyChart to ask questions about today's visit, request a non-urgent call back, or ask for a work or school excuse. You will get an email in the next two days asking about your experience. I hope that your e-visit has been valuable and will speed your recovery.    I have spent 5 minutes in review of e-visit questionnaire, review and updating patient chart, medical decision making and response to patient.   Margaretann Loveless, PA-C

## 2023-10-29 ENCOUNTER — Telehealth: Payer: Self-pay

## 2023-10-29 DIAGNOSIS — E0500 Thyrotoxicosis with diffuse goiter without thyrotoxic crisis or storm: Secondary | ICD-10-CM

## 2023-10-29 NOTE — Telephone Encounter (Signed)
Orders Placed This Encounter  Procedures   T4, free   TSH   T3, free

## 2023-10-30 ENCOUNTER — Other Ambulatory Visit: Payer: Medicaid Other

## 2023-10-30 LAB — T3, FREE: T3, Free: 2.1 pg/mL — ABNORMAL LOW (ref 2.3–4.2)

## 2023-10-30 LAB — T4, FREE: Free T4: 0.7 ng/dL — ABNORMAL LOW (ref 0.8–1.8)

## 2023-10-30 LAB — TSH: TSH: 22.71 m[IU]/L — ABNORMAL HIGH

## 2023-10-31 ENCOUNTER — Telehealth: Payer: Self-pay

## 2023-10-31 ENCOUNTER — Encounter: Payer: Self-pay | Admitting: Endocrinology

## 2023-10-31 NOTE — Telephone Encounter (Signed)
  Patient given results and medication changes as directed by MD.

## 2023-10-31 NOTE — Telephone Encounter (Signed)
-----   Message from [place] Thapa sent at 10/31/2023  9:27 AM EST ----- Please notify patient of labs reviewed, abnormal TSH and free T4.  Stop methimazole for now.  Will discuss further on coming follow-up visit on February 6.

## 2023-11-06 ENCOUNTER — Encounter: Payer: Self-pay | Admitting: Endocrinology

## 2023-11-06 ENCOUNTER — Ambulatory Visit: Payer: Medicaid Other | Admitting: Endocrinology

## 2023-11-06 VITALS — BP 118/72 | HR 68 | Ht 62.0 in | Wt 165.0 lb

## 2023-11-06 DIAGNOSIS — E0500 Thyrotoxicosis with diffuse goiter without thyrotoxic crisis or storm: Secondary | ICD-10-CM

## 2023-11-06 NOTE — Progress Notes (Signed)
 Outpatient Endocrinology Note [name], MD  11/06/23  Patient's Name: Erin Bell    DOB: 26-Apr-1991    MRN: [id number]  REASON OF VISIT: Follow-up for hyperthyroidism  REFERRING PROVIDER: Eldonna Suzen Octave, MD  PCP: Larwence Mliss SAILOR, PA-C  HISTORY OF PRESENT ILLNESS:   Erin Bell is a 33 y.o. old female with past medical history as listed below is follow-up for hyperthyroidism / Graves' disease.    Pertinent Thyroid  History: - Patient is known to have thyroid  disorder in the past, in her first pregnancy in 2014/2015 she had hyperthyroidism and was treated with methimazole  seems to be until 2016.  In 2018 in her second pregnancy she was treated with levothyroxine  and she had taken levothyroxine  seems to be on till 2022 per chart review.  Patient has not been on thyroid  medication for couple of years.   - Patient developed hyperthyroidism in the third trimester of pregnancy, with significantly elevated thyrotropin receptor antibody and started on methimazole  5 mg daily in September 2024.    Interval history  Patient has been taking methimazole  until her recent lab for thyroid  function test, was taking methimazole  5 mg daily.  She had increased tiredness otherwise denies any symptoms.  After not taking methimazole  she has started to feel better.  Recent thyroid  function test as followed with elevated TSH of 22 consistent with hypothyroidism.   Latest Reference Range & Units 10/30/23 10:00  TSH mIU/L 22.71 (H)  Triiodothyronine,Free,Serum 2.3 - 4.2 pg/mL 2.1 (L)  T4,Free(Direct) 0.8 - 1.8 ng/dL 0.7 (L)  (H): Data is abnormally high (L): Data is abnormally low  REVIEW OF SYSTEMS:  As per history of present illness.   PAST MEDICAL HISTORY: Past Medical History:  Diagnosis Date   Chlamydia    Depression    Previously treated with Risperdal   History of hypothyroidism 03/18/2014   Hypothyroid in previous pregnancy, TSH Q trimester, normal on 12/16/22      Hypothyroidism    Syphilis     PAST SURGICAL HISTORY: Past Surgical History:  Procedure Laterality Date   CESAREAN SECTION N/A 01/15/2013   Procedure: CESAREAN SECTION;  Surgeon: Aida DELENA Na, MD;  Location: [facility];  Service: Obstetrics;  Laterality: N/A;   INSERTION OF IMPLANON  ROD  06/13/2023   NOSE SURGERY     WISDOM TOOTH EXTRACTION      ALLERGIES: No Known Allergies  FAMILY HISTORY:  Family History  Problem Relation Age of Onset   Arthritis Father    Birth defects Sister        spina bifida   Asthma Brother    Diabetes Maternal Uncle    Thyroid  disease Mother    Other Neg Hx    Alcohol abuse Neg Hx    Hypertension Neg Hx    Hyperlipidemia Neg Hx    Heart disease Neg Hx    Hearing loss Neg Hx    Early death Neg Hx    Drug abuse Neg Hx    Miscarriages / Stillbirths Neg Hx    Mental retardation Neg Hx    Mental illness Neg Hx    Learning disabilities Neg Hx    Kidney disease Neg Hx    Stroke Neg Hx    Vision loss Neg Hx     SOCIAL HISTORY: Social History   Socioeconomic History   Marital status: Single    Spouse name: Not on file   Number of children: Not on file   Years of education: Not on  file   Highest education level: 12th grade  Occupational History   Not on file  Tobacco Use   Smoking status: Every Day    Current packs/day: 0.25    Types: Cigarettes   Smokeless tobacco: Never  Vaping Use   Vaping status: Never Used  Substance and Sexual Activity   Alcohol use: No    Comment: occasional   Drug use: No   Sexual activity: Yes    Birth control/protection: None  Other Topics Concern   Not on file  Social History Narrative   Not on file   Social Drivers of Health   Financial Resource Strain: Patient Declined (03/31/2023)   Overall Financial Resource Strain (CARDIA)    Difficulty of Paying Living Expenses: Patient declined  Food Insecurity: Patient Declined (03/31/2023)   Hunger Vital Sign    Worried About Running Out of Food in the  Last Year: Patient declined    Ran Out of Food in the Last Year: Patient declined  Transportation Needs: No Transportation Needs (03/31/2023)   PRAPARE - Administrator, Civil Service (Medical): No    Lack of Transportation (Non-Medical): No  Physical Activity: Insufficiently Active (03/31/2023)   Exercise Vital Sign    Days of Exercise per Week: 1 day    Minutes of Exercise per Session: 10 min  Stress: No Stress Concern Present (03/31/2023)   [employer] of Occupational Health - Occupational Stress Questionnaire    Feeling of Stress : Not at all  Social Connections: Not on file    MEDICATIONS:  Current Outpatient Medications  Medication Sig Dispense Refill   triamcinolone  cream (KENALOG ) 0.1 % Apply 1 Application topically 2 (two) times daily. Apply to affected areas. 30 g 0   No current facility-administered medications for this visit.    PHYSICAL EXAM: Vitals:   11/06/23 0846  BP: 118/72  Pulse: 68  SpO2: 97%  Weight: 165 lb (74.8 kg)  Height: 5' 2 (1.575 m)   Body mass index is 30.18 kg/m.  Wt Readings from Last 3 Encounters:  11/06/23 165 lb (74.8 kg)  08/06/23 158 lb 3.2 oz (71.8 kg)  07/30/23 158 lb (71.7 kg)     General: Well developed, well nourished female in no apparent distress.  HEENT: AT/[place], no external lesions. Hearing intact to the spoken word Eyes: EOMI. No stare, proptosis or lid lag. Conjunctiva clear and no icterus. No erythema or watering Neck: Trachea midline, neck supple without appreciable thyromegaly or lymphadenopathy and no palpable thyroid  nodules Neurologic: Alert, oriented, normal speech, deep tendon biceps reflexes normal,  no gross focal neurological deficit Extremities: No pedal pitting edema, no tremors of outstretched hands Skin: Warm, color good.  Psychiatric: Does not appear depressed or anxious  PERTINENT HISTORIC LABORATORY AND IMAGING STUDIES:  All pertinent laboratory results were reviewed. Please see HPI also for  further details.   TSH  Date Value Ref Range Status  10/30/2023 22.71 (H) mIU/L Final    Comment:              Reference Range .           > or = 20 Years  0.40-4.50 .                Pregnancy Ranges           First trimester    0.26-2.66           Second trimester   0.55-2.73  Third trimester    0.43-2.91   07/28/2023 0.07 (L) 0.35 - 5.50 uIU/mL Final  06/06/2023 0.06 (L) 0.35 - 5.50 uIU/mL Final    Lab Results  Component Value Date   FREET4 0.7 (L) 10/30/2023   FREET4 0.66 07/28/2023   FREET4 1.03 06/06/2023   T3FREE 2.1 (L) 10/30/2023   T3FREE 3.0 07/28/2023   T3FREE 4.6 (H) 06/06/2023   TSH 22.71 (H) 10/30/2023   TSH 0.07 (L) 07/28/2023   TSH 0.06 (L) 06/06/2023    No results found for: THYROTRECAB  Lab Results  Component Value Date   TSH 22.71 (H) 10/30/2023   TSH 0.07 (L) 07/28/2023   TSH 0.06 (L) 06/06/2023   FREET4 0.7 (L) 10/30/2023   FREET4 0.66 07/28/2023   FREET4 1.03 06/06/2023     Lab Results  Component Value Date   TSI <89 06/06/2023     No components found for: TRAB    ASSESSMENT / PLAN  1. Graves disease     -Patient was on and off thyroid  disorder from 2014/2016, she was treated for hypothyroidism and hyperthyroidism in the past.   -Patient developed hypothyroidism with significantly elevated thyrotropin receptor antibody, consistent with Graves' disease and methimazole  was restarted in the late third trimester of pregnancy.  She had delivery in mid September. -She has been on methimazole  5 mg daily.  It was started in beginning of September 2024. -Recent thyroid  function test with elevated TSH of 22.  Plan: -Stop methimazole . -We need to monitor thyroid  function test closely.  Discussed about potential symptoms of hypo and hyperthyroidism and asked to call our clinic if there is any concern in between the visits. -Thyroid  function test in 6 weeks and labs prior to follow-up visit.  Erin Bell was seen today for  follow-up.  Diagnoses and all orders for this visit:  Graves disease -     T4, free -     TSH -     T3, free     DISPOSITION Follow up in clinic in 6 weeks suggested.  All questions answered and patient verbalized understanding of the plan.  Erin Nixon, MD [facility] [address] [place], [address] [place], [place] [address] Phone # [phone]   At least part of this note was generated using voice recognition software. Inadvertent word errors may have occurred, which were not recognized during the proofreading process.

## 2023-12-01 ENCOUNTER — Telehealth: Admitting: Physician Assistant

## 2023-12-01 DIAGNOSIS — B379 Candidiasis, unspecified: Secondary | ICD-10-CM

## 2023-12-01 DIAGNOSIS — B9689 Other specified bacterial agents as the cause of diseases classified elsewhere: Secondary | ICD-10-CM | POA: Diagnosis not present

## 2023-12-01 DIAGNOSIS — T3695XA Adverse effect of unspecified systemic antibiotic, initial encounter: Secondary | ICD-10-CM | POA: Diagnosis not present

## 2023-12-01 DIAGNOSIS — N760 Acute vaginitis: Secondary | ICD-10-CM | POA: Diagnosis not present

## 2023-12-01 MED ORDER — FLUCONAZOLE 150 MG PO TABS: 150.0000 mg | ORAL_TABLET | Freq: Once | ORAL | 0 refills | Status: DC

## 2023-12-01 MED ORDER — METRONIDAZOLE 500 MG PO TABS
500.0000 mg | ORAL_TABLET | Freq: Two times a day (BID) | ORAL | 0 refills | Status: AC
Start: 2023-12-01 — End: 2023-12-08

## 2023-12-01 NOTE — Progress Notes (Signed)
 E-Visit for Vaginal Symptoms  We are sorry that you are not feeling well. Here is how we plan to help! Based on what you shared with me it looks like you: May have a vaginosis due to bacteria  Vaginosis is an inflammation of the vagina that can result in discharge, itching and pain. The cause is usually a change in the normal balance of vaginal bacteria or an infection. Vaginosis can also result from reduced estrogen levels after menopause.  The most common causes of vaginosis are:   Bacterial vaginosis which results from an overgrowth of one on several organisms that are normally present in your vagina.   Yeast infections which are caused by a naturally occurring fungus called candida.   Vaginal atrophy (atrophic vaginosis) which results from the thinning of the vagina from reduced estrogen levels after menopause.   Trichomoniasis which is caused by a parasite and is commonly transmitted by sexual intercourse.  Factors that increase your risk of developing vaginosis include: Medications, such as antibiotics and steroids Uncontrolled diabetes Use of hygiene products such as bubble bath, vaginal spray or vaginal deodorant Douching Wearing damp or tight-fitting clothing Using an intrauterine device (IUD) for birth control Hormonal changes, such as those associated with pregnancy, birth control pills or menopause Sexual activity Having a sexually transmitted infection  Your treatment plan is Metronidazole or Flagyl 500mg  twice a day for 7 days.  I have electronically sent this prescription into the pharmacy that you have chosen.  Diflucan given as prophylaxis as patient tends to get vaginal yeast infections with antibiotic use.  Be sure to take all of the medication as directed. Stop taking any medication if you develop a rash, tongue swelling or shortness of breath. Mothers who are breast feeding should consider pumping and discarding their breast milk while on these antibiotics.  However, there is no consensus that infant exposure at these doses would be harmful.  Remember that medication creams can weaken latex condoms. Marland Kitchen   HOME CARE:  Good hygiene may prevent some types of vaginosis from recurring and may relieve some symptoms:  Avoid baths, hot tubs and whirlpool spas. Rinse soap from your outer genital area after a shower, and dry the area well to prevent irritation. Don't use scented or harsh soaps, such as those with deodorant or antibacterial action. Avoid irritants. These include scented tampons and pads. Wipe from front to back after using the toilet. Doing so avoids spreading fecal bacteria to your vagina.  Other things that may help prevent vaginosis include:  Don't douche. Your vagina doesn't require cleansing other than normal bathing. Repetitive douching disrupts the normal organisms that reside in the vagina and can actually increase your risk of vaginal infection. Douching won't clear up a vaginal infection. Use a latex condom. Both female and female latex condoms may help you avoid infections spread by sexual contact. Wear cotton underwear. Also wear pantyhose with a cotton crotch. If you feel comfortable without it, skip wearing underwear to bed. Yeast thrives in [employer] Your symptoms should improve in the next day or two.  GET HELP RIGHT AWAY IF:  You have pain in your lower abdomen ( pelvic area or over your ovaries) You develop nausea or vomiting You develop a fever Your discharge changes or worsens You have persistent pain with intercourse You develop shortness of breath, a rapid pulse, or you faint.  These symptoms could be signs of problems or infections that need to be evaluated by a medical provider now.  MAKE SURE YOU   Understand these instructions. Will watch your condition. Will get help right away if you are not doing well or get worse.  Thank you for choosing an e-visit.  Your e-visit answers were reviewed by a  board certified advanced clinical practitioner to complete your personal care plan. Depending upon the condition, your plan could have included both over the counter or prescription medications.  Please review your pharmacy choice. Make sure the pharmacy is open so you can pick up prescription now. If there is a problem, you may contact your provider through [employer] and have the prescription routed to another pharmacy.  Your safety is important to [place]. If you have drug allergies check your prescription carefully.   For the next 24 hours you can use MyChart to ask questions about today's visit, request a non-urgent call back, or ask for a work or school excuse. You will get an email in the next two days asking about your experience. I hope that your e-visit has been valuable and will speed your recovery.    I have spent 5 minutes in review of e-visit questionnaire, review and updating patient chart, medical decision making and response to patient.   Margaretann Loveless, PA-C

## 2023-12-04 ENCOUNTER — Other Ambulatory Visit: Payer: Self-pay

## 2023-12-04 DIAGNOSIS — E0500 Thyrotoxicosis with diffuse goiter without thyrotoxic crisis or storm: Secondary | ICD-10-CM

## 2023-12-04 DIAGNOSIS — E0590 Thyrotoxicosis, unspecified without thyrotoxic crisis or storm: Secondary | ICD-10-CM

## 2023-12-17 ENCOUNTER — Encounter: Payer: Self-pay | Admitting: Endocrinology

## 2023-12-17 ENCOUNTER — Other Ambulatory Visit: Payer: Medicaid Other

## 2023-12-17 LAB — T4, FREE: Free T4: 1.2 ng/dL (ref 0.8–1.8)

## 2023-12-17 LAB — T3, FREE: T3, Free: 3.4 pg/mL (ref 2.3–4.2)

## 2023-12-17 LAB — TSH: TSH: 0.44 m[IU]/L

## 2023-12-22 ENCOUNTER — Encounter: Payer: Self-pay | Admitting: Endocrinology

## 2023-12-22 ENCOUNTER — Ambulatory Visit (INDEPENDENT_AMBULATORY_CARE_PROVIDER_SITE_OTHER): Payer: Medicaid Other | Admitting: Endocrinology

## 2023-12-22 VITALS — BP 106/80 | HR 78 | Resp 20 | Ht 62.0 in | Wt 172.6 lb

## 2023-12-22 DIAGNOSIS — E0500 Thyrotoxicosis with diffuse goiter without thyrotoxic crisis or storm: Secondary | ICD-10-CM

## 2023-12-22 DIAGNOSIS — E0590 Thyrotoxicosis, unspecified without thyrotoxic crisis or storm: Secondary | ICD-10-CM

## 2023-12-22 NOTE — Progress Notes (Signed)
 Outpatient Endocrinology Note [place] Eri Mcevers, MD  12/22/23  Patient's Name: Erin Bell    DOB: Nov 03, 1990    MRN: [id number]  REASON OF VISIT: Follow-up for hyperthyroidism  REFERRING PROVIDER: Federico Flake, MD  PCP: Marny Lowenstein, PA-C  HISTORY OF PRESENT ILLNESS:   Erin Bell is a 33 y.o. old female with past medical history as listed below is follow-up for hyperthyroidism / Graves' disease.    Pertinent Thyroid History: - Patient is known to have thyroid disorder in the past, in her first pregnancy in 2014/2015 she had hyperthyroidism and was treated with methimazole seems to be until 2016.  In 2018 in her second pregnancy she was treated with levothyroxine and she had taken levothyroxine seems to be on till 2022 per chart review.  Patient has not been on thyroid medication for couple of years.   - Patient developed hyperthyroidism in the third trimester of pregnancy, with significantly elevated thyrotropin receptor antibody and started on methimazole 5 mg daily in September 2024.   -Methimazole was stopped in the end of January 2025 when TSH was elevated to 22.  Interval history  Methimazole was stopped in January.  She has not been taking thyroid medication.  She denies palpitation or heat intolerance.  Overall feeling normal energy.  She has not been nursing the baby.  No cold intolerance.  No hypo and hyperthyroid symptoms.  Recent thyroid function test normalized with normal TSH, free T4 and free T3 as follows.   Latest Reference Range & Units 12/17/23 08:50  TSH mIU/L 0.44  Triiodothyronine,Free,Serum 2.3 - 4.2 pg/mL 3.4  T4,Free(Direct) 0.8 - 1.8 ng/dL 1.2    REVIEW OF SYSTEMS:  As per history of present illness.   PAST MEDICAL HISTORY: Past Medical History:  Diagnosis Date   Chlamydia    Depression    Previously treated with Risperdal   History of hypothyroidism 03/18/2014   Hypothyroid in previous pregnancy, TSH Q trimester, normal on 12/16/22      Hypothyroidism    Syphilis     PAST SURGICAL HISTORY: Past Surgical History:  Procedure Laterality Date   CESAREAN SECTION N/A 01/15/2013   Procedure: CESAREAN SECTION;  Surgeon: Kathreen Cosier, MD;  Location: [facility];  Service: Obstetrics;  Laterality: N/A;   INSERTION OF IMPLANON ROD  06/13/2023   NOSE SURGERY     WISDOM TOOTH EXTRACTION      ALLERGIES: No Known Allergies  FAMILY HISTORY:  Family History  Problem Relation Age of Onset   Arthritis Father    Birth defects Sister        spina bifida   Asthma Brother    Diabetes Maternal Uncle    Thyroid disease Mother    Other Neg Hx    Alcohol abuse Neg Hx    Hypertension Neg Hx    Hyperlipidemia Neg Hx    Heart disease Neg Hx    Hearing loss Neg Hx    Early death Neg Hx    Drug abuse Neg Hx    Miscarriages / Stillbirths Neg Hx    Mental retardation Neg Hx    Mental illness Neg Hx    Learning disabilities Neg Hx    Kidney disease Neg Hx    Stroke Neg Hx    Vision loss Neg Hx     SOCIAL HISTORY: Social History   Socioeconomic History   Marital status: Single    Spouse name: Not on file   Number of children: Not on  file   Years of education: Not on file   Highest education level: 12th grade  Occupational History   Not on file  Tobacco Use   Smoking status: Every Day    Current packs/day: 0.25    Types: Cigarettes   Smokeless tobacco: Never  Vaping Use   Vaping status: Never Used  Substance and Sexual Activity   Alcohol use: No    Comment: occasional   Drug use: No   Sexual activity: Yes    Birth control/protection: None  Other Topics Concern   Not on file  Social History Narrative   Not on file   Social Drivers of Health   Financial Resource Strain: Patient Declined (03/31/2023)   Overall Financial Resource Strain (CARDIA)    Difficulty of Paying Living Expenses: Patient declined  Food Insecurity: Patient Declined (03/31/2023)   Hunger Vital Sign    Worried About Running Out of Food in the  Last Year: Patient declined    Ran Out of Food in the Last Year: Patient declined  Transportation Needs: No Transportation Needs (03/31/2023)   PRAPARE - Administrator, Civil Service (Medical): No    Lack of Transportation (Non-Medical): No  Physical Activity: Insufficiently Active (03/31/2023)   Exercise Vital Sign    Days of Exercise per Week: 1 day    Minutes of Exercise per Session: 10 min  Stress: No Stress Concern Present (03/31/2023)   [employer] of Occupational Health - Occupational Stress Questionnaire    Feeling of Stress : Not at all  Social Connections: Not on file    MEDICATIONS:  Current Outpatient Medications  Medication Sig Dispense Refill   triamcinolone cream (KENALOG) 0.1 % Apply 1 Application topically 2 (two) times daily. Apply to affected areas. 30 g 0   risperiDONE microspheres (RISPERDAL CONSTA) 25 MG injection Inject 25 mg into the muscle every 14 (fourteen) days. (Patient not taking: Reported on 12/22/2023)     No current facility-administered medications for this visit.    PHYSICAL EXAM: Vitals:   12/22/23 0856  BP: 106/80  Pulse: 78  Resp: 20  SpO2: 98%  Weight: 172 lb 9.6 oz (78.3 kg)  Height: 5\' 2"  (1.575 m)    Body mass index is 31.57 kg/m.  Wt Readings from Last 3 Encounters:  12/22/23 172 lb 9.6 oz (78.3 kg)  11/06/23 165 lb (74.8 kg)  08/06/23 158 lb 3.2 oz (71.8 kg)     General: Well developed, well nourished female in no apparent distress.  HEENT: AT/[place], no external lesions. Hearing intact to the spoken word Eyes: EOMI. No stare, proptosis or lid lag. Conjunctiva clear and no icterus. No erythema or watering Neck: Trachea midline, neck supple without appreciable thyromegaly or lymphadenopathy and no palpable thyroid nodules Neurologic: Alert, oriented, normal speech, deep tendon biceps reflexes normal,  no gross focal neurological deficit Extremities: No pedal pitting edema, no tremors of outstretched hands Skin: Warm,  color good.  Psychiatric: Does not appear depressed or anxious  PERTINENT HISTORIC LABORATORY AND IMAGING STUDIES:  All pertinent laboratory results were reviewed. Please see HPI also for further details.   TSH  Date Value Ref Range Status  12/17/2023 0.44 mIU/L Final    Comment:              Reference Range .           > or = 20 Years  0.40-4.50 .  Pregnancy Ranges           First trimester    0.26-2.66           Second trimester   0.55-2.73           Third trimester    0.43-2.91   10/30/2023 22.71 (H) mIU/L Final    Comment:              Reference Range .           > or = 20 Years  0.40-4.50 .                Pregnancy Ranges           First trimester    0.26-2.66           Second trimester   0.55-2.73           Third trimester    0.43-2.91   07/28/2023 0.07 (L) 0.35 - 5.50 uIU/mL Final    Lab Results  Component Value Date   FREET4 1.2 12/17/2023   FREET4 0.7 (L) 10/30/2023   FREET4 0.66 07/28/2023   T3FREE 3.4 12/17/2023   T3FREE 2.1 (L) 10/30/2023   T3FREE 3.0 07/28/2023   TSH 0.44 12/17/2023   TSH 22.71 (H) 10/30/2023   TSH 0.07 (L) 07/28/2023    No results found for: "THYROTRECAB"  Lab Results  Component Value Date   TSH 0.44 12/17/2023   TSH 22.71 (H) 10/30/2023   TSH 0.07 (L) 07/28/2023   FREET4 1.2 12/17/2023   FREET4 0.7 (L) 10/30/2023   FREET4 0.66 07/28/2023     Lab Results  Component Value Date   TSI <89 06/06/2023     No components found for: "TRAB"    ASSESSMENT / PLAN  1. Hyperthyroidism   2. Graves disease      -Patient was on and off thyroid disorder from 2014/2016, she was treated for hypothyroidism and hyperthyroidism in the past.   -Patient developed hypothyroidism with significantly elevated thyrotropin receptor antibody, consistent with Graves' disease and methimazole was started in the late third trimester of pregnancy.  She had delivery in mid September.  She was started on methimazole 5 mg daily from  September 2024 and was stopped in the end of January 2025 when TSH was elevated to 22.  Plan: -Recent thyroid function test normal with TSH of 0.44 without thyroid medication. -She needs close monitoring of thyroid function test.  Discussed about potential symptoms of hypo and hyperthyroidism and asked to call our clinic in between the visits. -She does not need thyroid medication at this time. -Thyroid function test in 4 months prior to follow-up visit.   Diagnoses and all orders for this visit:  Hyperthyroidism -     T4, free -     T3, free -     TSH  Graves disease    DISPOSITION Follow up in clinic in 4 months suggested.  All questions answered and patient verbalized understanding of the plan.  [place] Kelce Bouton, MD [facility] [address] [place], [address] [place], [place] [address] Phone # [phone]   At least part of this note was generated using voice recognition software. Inadvertent word errors may have occurred, which were not recognized during the proofreading process.

## 2024-04-20 ENCOUNTER — Telehealth: Admitting: Physician Assistant

## 2024-04-20 DIAGNOSIS — B9689 Other specified bacterial agents as the cause of diseases classified elsewhere: Secondary | ICD-10-CM | POA: Diagnosis not present

## 2024-04-20 DIAGNOSIS — N760 Acute vaginitis: Secondary | ICD-10-CM

## 2024-04-20 MED ORDER — METRONIDAZOLE 0.75 % VA GEL: 1.0000 | Freq: Every day | VAGINAL | 0 refills | Status: DC

## 2024-04-20 MED ORDER — METRONIDAZOLE 500 MG PO TABS: 500.0000 mg | ORAL_TABLET | Freq: Two times a day (BID) | ORAL | 0 refills | Status: DC

## 2024-04-20 MED ORDER — FLUCONAZOLE 150 MG PO TABS
ORAL_TABLET | ORAL | 0 refills | Status: DC
Start: 2024-04-20 — End: 2024-06-30

## 2024-04-20 NOTE — Addendum Note (Signed)
 Addended by: GLADIS ELSIE BROCKS on: 04/20/2024 10:05 AM   Modules accepted: Orders

## 2024-04-20 NOTE — Progress Notes (Signed)
 E-Visit for Vaginal Symptoms  We are sorry that you are not feeling well. Here is how we plan to help! Based on what you shared with me it looks like you: May have a vaginosis due to bacteria  Vaginosis is an inflammation of the vagina that can result in discharge, itching and pain. The cause is usually a change in the normal balance of vaginal bacteria or an infection. Vaginosis can also result from reduced estrogen levels after menopause.  The most common causes of vaginosis are:   Bacterial vaginosis which results from an overgrowth of one on several organisms that are normally present in your vagina.   Yeast infections which are caused by a naturally occurring fungus called candida.   Vaginal atrophy (atrophic vaginosis) which results from the thinning of the vagina from reduced estrogen levels after menopause.   Trichomoniasis which is caused by a parasite and is commonly transmitted by sexual intercourse.  Factors that increase your risk of developing vaginosis include: Medications, such as antibiotics and steroids Uncontrolled diabetes Use of hygiene products such as bubble bath, vaginal spray or vaginal deodorant Douching Wearing damp or tight-fitting clothing Using an intrauterine device (IUD) for birth control Hormonal changes, such as those associated with pregnancy, birth control pills or menopause Sexual activity Having a sexually transmitted infection  Your treatment plan is Metronidazole  or Flagyl  500mg  twice a day for 7 days.  I have electronically sent this prescription into the pharmacy that you have chosen. I have also sent in a course of Diflucan  in case of yeast from antibiotic use.   Be sure to take all of the medication as directed. Stop taking any medication if you develop a rash, tongue swelling or shortness of breath. Mothers who are breast feeding should consider pumping and discarding their breast milk while on these antibiotics. However, there is no  consensus that infant exposure at these doses would be harmful.  Remember that medication creams can weaken latex condoms. Aaron Aas   HOME CARE:  Good hygiene may prevent some types of vaginosis from recurring and may relieve some symptoms:  Avoid baths, hot tubs and whirlpool spas. Rinse soap from your outer genital area after a shower, and dry the area well to prevent irritation. Don't use scented or harsh soaps, such as those with deodorant or antibacterial action. Avoid irritants. These include scented tampons and pads. Wipe from front to back after using the toilet. Doing so avoids spreading fecal bacteria to your vagina.  Other things that may help prevent vaginosis include:  Don't douche. Your vagina doesn't require cleansing other than normal bathing. Repetitive douching disrupts the normal organisms that reside in the vagina and can actually increase your risk of vaginal infection. Douching won't clear up a vaginal infection. Use a latex condom. Both female and female latex condoms may help you avoid infections spread by sexual contact. Wear cotton underwear. Also wear pantyhose with a cotton crotch. If you feel comfortable without it, skip wearing underwear to bed. Yeast thrives in [employer] Your symptoms should improve in the next day or two.  GET HELP RIGHT AWAY IF:  You have pain in your lower abdomen ( pelvic area or over your ovaries) You develop nausea or vomiting You develop a fever Your discharge changes or worsens You have persistent pain with intercourse You develop shortness of breath, a rapid pulse, or you faint.  These symptoms could be signs of problems or infections that need to be evaluated by a medical provider now.  MAKE SURE YOU   Understand these instructions. Will watch your condition. Will get help right away if you are not doing well or get worse.  Thank you for choosing an e-visit.  Your e-visit answers were reviewed by a board certified  advanced clinical practitioner to complete your personal care plan. Depending upon the condition, your plan could have included both over the counter or prescription medications.  Please review your pharmacy choice. Make sure the pharmacy is open so you can pick up prescription now. If there is a problem, you may contact your provider through [employer] and have the prescription routed to another pharmacy.  Your safety is important to us . If you have drug allergies check your prescription carefully.   For the next 24 hours you can use MyChart to ask questions about today's visit, request a non-urgent call back, or ask for a work or school excuse. You will get an email in the next two days asking about your experience. I hope that your e-visit has been valuable and will speed your recovery.

## 2024-04-20 NOTE — Progress Notes (Signed)
 I have spent 5 minutes in review of e-visit questionnaire, review and updating patient chart, medical decision making and response to patient.   Piedad Climes, PA-C

## 2024-04-28 ENCOUNTER — Other Ambulatory Visit

## 2024-04-28 LAB — T3, FREE: T3, Free: 3.7 pg/mL (ref 2.3–4.2)

## 2024-04-28 LAB — TSH: TSH: 0.06 m[IU]/L — ABNORMAL LOW

## 2024-04-28 LAB — T4, FREE: Free T4: 1.3 ng/dL (ref 0.8–1.8)

## 2024-04-29 ENCOUNTER — Ambulatory Visit: Payer: Self-pay | Admitting: Endocrinology

## 2024-05-03 ENCOUNTER — Ambulatory Visit (INDEPENDENT_AMBULATORY_CARE_PROVIDER_SITE_OTHER): Admitting: Endocrinology

## 2024-05-03 ENCOUNTER — Encounter: Payer: Self-pay | Admitting: Endocrinology

## 2024-05-03 VITALS — BP 100/70 | HR 67 | Resp 20 | Ht 62.0 in | Wt 169.2 lb

## 2024-05-03 DIAGNOSIS — E0500 Thyrotoxicosis with diffuse goiter without thyrotoxic crisis or storm: Secondary | ICD-10-CM | POA: Diagnosis not present

## 2024-05-03 DIAGNOSIS — E0590 Thyrotoxicosis, unspecified without thyrotoxic crisis or storm: Secondary | ICD-10-CM

## 2024-05-03 NOTE — Progress Notes (Signed)
 Outpatient Endocrinology Note Erin Prynce Jacober, MD  05/03/24  Patient's Name: Erin Bell    DOB: 17-Apr-1991    MRN: [id number]  REASON OF VISIT: Follow-up for hyperthyroidism  REFERRING PROVIDER: Eldonna Suzen Octave, MD  PCP: Erin Mliss SAILOR, PA-C  HISTORY OF PRESENT ILLNESS:   Erin Bell is a 33 y.o. old female with past medical history as listed below is follow-up for hyperthyroidism / Graves' disease.    Pertinent Thyroid  History: - Patient is known to have thyroid  disorder in the past, in her first pregnancy in 2014/2015 she had hyperthyroidism and was treated with methimazole  seems to be until 2016.  In 2018 in her second pregnancy she was treated with levothyroxine  and she had taken levothyroxine  seems to be on till 2022 per chart review.  Patient has not been on thyroid  medication for couple of years.   - Patient developed hyperthyroidism in the third trimester of pregnancy, with significantly elevated thyrotropin receptor antibody and started on methimazole  5 mg daily in September 2024.   -Methimazole  was stopped in the end of January 2025 when TSH was elevated to 22.  Interval history  Patient has not been taking methimazole  since January.  Patient denies palpitation or heat intolerance.  No change in bowel habit.  Body weight is still relatively stable.  No cold intolerance.  Recently she had mildly low TSH with normal free T4 and free T3 as follows.  No other complaints today.  Patient denies any recent sickness or she denies taking any biotin or multivitamins.  No plan for pregnancy in the near future.  She has been on birth control method.   Latest Reference Range & Units 04/28/24 09:22  TSH mIU/L 0.06 (L)  Triiodothyronine,Free,Serum 2.3 - 4.2 pg/mL 3.7  T4,Free(Direct) 0.8 - 1.8 ng/dL 1.3  (L): Data is abnormally low   REVIEW OF SYSTEMS:  As per history of present illness.   PAST MEDICAL HISTORY: Past Medical History:  Diagnosis Date   Chlamydia     Depression    Previously treated with Risperdal   History of hypothyroidism 03/18/2014   Hypothyroid in previous pregnancy, TSH Q trimester, normal on 12/16/22     Hypothyroidism    Syphilis     PAST SURGICAL HISTORY: Past Surgical History:  Procedure Laterality Date   CESAREAN SECTION N/A 01/15/2013   Procedure: CESAREAN SECTION;  Surgeon: Aida DELENA Na, MD;  Location: [facility];  Service: Obstetrics;  Laterality: N/A;   INSERTION OF IMPLANON  ROD  06/13/2023   NOSE SURGERY     WISDOM TOOTH EXTRACTION      ALLERGIES: No Known Allergies  FAMILY HISTORY:  Family History  Problem Relation Age of Onset   Arthritis Father    Birth defects Sister        spina bifida   Asthma Brother    Diabetes Maternal Uncle    Thyroid  disease Mother    Other Neg Hx    Alcohol abuse Neg Hx    Hypertension Neg Hx    Hyperlipidemia Neg Hx    Heart disease Neg Hx    Hearing loss Neg Hx    Early death Neg Hx    Drug abuse Neg Hx    Miscarriages / Stillbirths Neg Hx    Mental retardation Neg Hx    Mental illness Neg Hx    Learning disabilities Neg Hx    Kidney disease Neg Hx    Stroke Neg Hx    Vision loss Neg Hx  SOCIAL HISTORY: Social History   Socioeconomic History   Marital status: Single    Spouse name: Not on file   Number of children: Not on file   Years of education: Not on file   Highest education level: 12th grade  Occupational History   Not on file  Tobacco Use   Smoking status: Every Day    Current packs/day: 0.25    Types: Cigarettes   Smokeless tobacco: Never  Vaping Use   Vaping status: Never Used  Substance and Sexual Activity   Alcohol use: No    Comment: occasional   Drug use: No   Sexual activity: Yes    Birth control/protection: None  Other Topics Concern   Not on file  Social History Narrative   Not on file   Social Drivers of Health   Financial Resource Strain: Patient Declined (03/31/2023)   Overall Financial Resource Strain (CARDIA)     Difficulty of Paying Living Expenses: Patient declined  Food Insecurity: Patient Declined (03/31/2023)   Hunger Vital Sign    Worried About Running Out of Food in the Last Year: Patient declined    Ran Out of Food in the Last Year: Patient declined  Transportation Needs: No Transportation Needs (03/31/2023)   PRAPARE - Administrator, Civil Service (Medical): No    Lack of Transportation (Non-Medical): No  Physical Activity: Insufficiently Active (03/31/2023)   Exercise Vital Sign    Days of Exercise per Week: 1 day    Minutes of Exercise per Session: 10 min  Stress: No Stress Concern Present (03/31/2023)   [employer] of Occupational Health - Occupational Stress Questionnaire    Feeling of Stress : Not at all  Social Connections: Not on file    MEDICATIONS:  Current Outpatient Medications  Medication Sig Dispense Refill   fluconazole  (DIFLUCAN ) 150 MG tablet Take 1 tablet PO once. Repeat in 3 days if needed. 2 tablet 0   metroNIDAZOLE  (METROGEL ) 0.75 % vaginal gel Place 1 Applicatorful vaginally at bedtime. 70 g 0   metroNIDAZOLE  (METROGEL ) 0.75 % vaginal gel Apply 1 applicatorful PV nighttime x 5 nights     triamcinolone  cream (KENALOG ) 0.1 % Apply 1 Application topically 2 (two) times daily. Apply to affected areas. 30 g 0   No current facility-administered medications for this visit.    PHYSICAL EXAM: Vitals:   05/03/24 0941  BP: 100/70  Pulse: 67  Resp: 20  SpO2: 97%  Weight: 169 lb 3.2 oz (76.7 kg)  Height: 5' 2 (1.575 m)    Body mass index is 30.95 kg/m.  Wt Readings from Last 3 Encounters:  05/03/24 169 lb 3.2 oz (76.7 kg)  12/22/23 172 lb 9.6 oz (78.3 kg)  11/06/23 165 lb (74.8 kg)     General: Well developed, well nourished female in no apparent distress.  HEENT: AT/[place], no external lesions. Hearing intact to the spoken word Eyes: EOMI. No stare, proptosis or lid lag. Conjunctiva clear and no icterus. No erythema or watering Neck: Trachea midline,  neck supple without appreciable thyromegaly or lymphadenopathy and no palpable thyroid  nodules Neurologic: Alert, oriented, normal speech, deep tendon biceps reflexes normal,  no gross focal neurological deficit Extremities: No pedal pitting edema, no tremors of outstretched hands Skin: Warm, color good.  Psychiatric: Does not appear depressed or anxious  PERTINENT HISTORIC LABORATORY AND IMAGING STUDIES:  All pertinent laboratory results were reviewed. Please see HPI also for further details.   TSH  Date Value Ref Range Status  04/28/2024 0.06 (L) mIU/L Final    Comment:              Reference Range .           > or = 20 Years  0.40-4.50 .                Pregnancy Ranges           First trimester    0.26-2.66           Second trimester   0.55-2.73           Third trimester    0.43-2.91   12/17/2023 0.44 mIU/L Final    Comment:              Reference Range .           > or = 20 Years  0.40-4.50 .                Pregnancy Ranges           First trimester    0.26-2.66           Second trimester   0.55-2.73           Third trimester    0.43-2.91   10/30/2023 22.71 (H) mIU/L Final    Comment:              Reference Range .           > or = 20 Years  0.40-4.50 .                Pregnancy Ranges           First trimester    0.26-2.66           Second trimester   0.55-2.73           Third trimester    0.43-2.91     Lab Results  Component Value Date   FREET4 1.3 04/28/2024   FREET4 1.2 12/17/2023   FREET4 0.7 (L) 10/30/2023   T3FREE 3.7 04/28/2024   T3FREE 3.4 12/17/2023   T3FREE 2.1 (L) 10/30/2023   TSH 0.06 (L) 04/28/2024   TSH 0.44 12/17/2023   TSH 22.71 (H) 10/30/2023    No results found for: THYROTRECAB  Lab Results  Component Value Date   TSH 0.06 (L) 04/28/2024   TSH 0.44 12/17/2023   TSH 22.71 (H) 10/30/2023   FREET4 1.3 04/28/2024   FREET4 1.2 12/17/2023   FREET4 0.7 (L) 10/30/2023     Lab Results  Component Value Date   TSI <89 06/06/2023      No components found for: TRAB    ASSESSMENT / PLAN  1. Hyperthyroidism   2. Graves disease      -Patient was on and off thyroid  disorder from 2014/2016, she was treated for hypothyroidism and hyperthyroidism in the past.   -Patient developed hyperthyroidism with significantly elevated thyrotropin receptor antibody, consistent with Graves' disease and methimazole  was started in the late third trimester of pregnancy.  She had delivery in mid September.  She was started on methimazole  5 mg daily from September 2024 and was stopped in the end of January 2025 when TSH was elevated to 22.  Plan: -Patient thyroid  function test with low TSH.  She has no hyperthyroid symptoms.  She is clinically euthyroid today. -No plan for antithyroid medication at this time.  Will continue to monitor without medication. -Check thyroid  function test prior to follow-up visit in 4  months. -Discussed about potential hyperthyroid symptoms and asked to call our clinic if develops.   Diagnoses and all orders for this visit:  Hyperthyroidism -     T4, free -     TSH -     T3, free  Graves disease -     T4, free -     TSH -     T3, free     DISPOSITION Follow up in clinic in 4 months suggested.  All questions answered and patient verbalized understanding of the plan.  Erin Maragret Vanacker, MD [facility] Endocrinology [facility] [address] [place], [address] [place], [place] [address] Phone # [phone]   At least part of this note was generated using voice recognition software. Inadvertent word errors may have occurred, which were not recognized during the proofreading process.

## 2024-06-30 ENCOUNTER — Telehealth: Admitting: Family Medicine

## 2024-06-30 DIAGNOSIS — B3731 Acute candidiasis of vulva and vagina: Secondary | ICD-10-CM

## 2024-06-30 MED ORDER — FLUCONAZOLE 150 MG PO TABS: ORAL_TABLET | ORAL | 0 refills | Status: DC

## 2024-06-30 NOTE — Progress Notes (Signed)

## 2024-07-03 ENCOUNTER — Telehealth: Admitting: Nurse Practitioner

## 2024-07-03 DIAGNOSIS — N760 Acute vaginitis: Secondary | ICD-10-CM

## 2024-07-03 MED ORDER — METRONIDAZOLE 0.75 % VA GEL: 1.0000 | Freq: Every day | VAGINAL | 0 refills | Status: AC

## 2024-07-03 NOTE — Progress Notes (Signed)
 We are sorry that you are not feeling well. Here is how we plan to help! Based on what you shared with me it looks like you: May have a vaginosis due to bacteria   Vaginosis is an inflammation of the vagina that can result in discharge, itching and pain. The cause is usually a change in the normal balance of vaginal bacteria or an infection. Vaginosis can also result from reduced estrogen levels after menopause.   The most common causes of vaginosis are:               Bacterial vaginosis which results from an overgrowth of one on several organisms that are normally present in your vagina.               Yeast infections which are caused by a naturally occurring fungus called candida.               Vaginal atrophy (atrophic vaginosis) which results from the thinning of the vagina from reduced estrogen levels after menopause.               Trichomoniasis which is caused by a parasite and is commonly transmitted by sexual intercourse.   Factors that increase your risk of developing vaginosis include: Medications, such as antibiotics and steroids Uncontrolled diabetes Use of hygiene products such as bubble bath, vaginal spray or vaginal deodorant Douching Wearing damp or tight-fitting clothing Using an intrauterine device (IUD) for birth control Hormonal changes, such as those associated with pregnancy, birth control pills or menopause Sexual activity Having a sexually transmitted infection   Your treatment plan is metrogel  nightly for 7 nights   Be sure to take all of the medication as directed. Stop taking any medication if you develop a rash, tongue swelling or shortness of breath. Mothers who are breast feeding should consider pumping and discarding their breast milk while on these antibiotics. However, there is no consensus that infant exposure at these doses would be harmful.  Remember that medication creams can weaken latex condoms.     HOME CARE:   Good hygiene may prevent some  types of vaginosis from recurring and may relieve some symptoms:   Avoid baths, hot tubs and whirlpool spas. Rinse soap from your outer genital area after a shower, and dry the area well to prevent irritation. Don't use scented or harsh soaps, such as those with deodorant or antibacterial action. Avoid irritants. These include scented tampons and pads. Wipe from front to back after using the toilet. Doing so avoids spreading fecal bacteria to your vagina.   Other things that may help prevent vaginosis include:   Don't douche. Your vagina doesn't require cleansing other than normal bathing. Repetitive douching disrupts the normal organisms that reside in the vagina and can actually increase your risk of vaginal infection. Douching won't clear up a vaginal infection. Use a latex condom. Both female and female latex condoms may help you avoid infections spread by sexual contact. Wear cotton underwear. Also wear pantyhose with a cotton crotch. If you feel comfortable without it, skip wearing underwear to bed. Yeast thrives in [employer] Your symptoms should improve in the next day or two.   GET HELP RIGHT AWAY IF:   You have pain in your lower abdomen ( pelvic area or over your ovaries) You develop nausea or vomiting You develop a fever Your discharge changes or worsens You have persistent pain with intercourse You develop shortness of breath, a rapid pulse, or you faint.  These symptoms could be signs of problems or infections that need to be evaluated by a medical provider now.   MAKE SURE YOU    Understand these instructions. Will watch your condition. Will get help right away if you are not doing well or get worse.   Your e-visit answers were reviewed by a board certified advanced clinical practitioner to complete your personal care plan. Depending upon the condition, your plan could have included both over the counter or prescription medications. Please review your pharmacy  choice to make sure that you have choses a pharmacy that is open for you to pick up any needed prescription, Your safety is important to us . If you have drug allergies check your prescription carefully.    You can use MyChart to ask questions about today's visit, request a non-urgent call back, or ask for a work or school excuse for 24 hours related to this e-Visit. If it has been greater than 24 hours you will need to follow up with your provider, or enter a new e-Visit to address those concerns. You will get a MyChart message within the next two days asking about your experience. I hope that your e-visit has been valuable and will speed your recovery.   I have spent 5 minutes in review of e-visit questionnaire, review and updating patient chart, medical decision making and response to patient.    Erdine Hulen W Amera Banos, NP

## 2024-08-30 ENCOUNTER — Other Ambulatory Visit

## 2024-08-31 ENCOUNTER — Ambulatory Visit: Payer: Self-pay | Admitting: Endocrinology

## 2024-08-31 LAB — T4, FREE: Free T4: 1 ng/dL (ref 0.8–1.8)

## 2024-08-31 LAB — TSH: TSH: 0.11 m[IU]/L — ABNORMAL LOW

## 2024-08-31 LAB — T3, FREE: T3, Free: 2.8 pg/mL (ref 2.3–4.2)

## 2024-09-03 ENCOUNTER — Encounter: Payer: Self-pay | Admitting: Endocrinology

## 2024-09-03 ENCOUNTER — Ambulatory Visit (INDEPENDENT_AMBULATORY_CARE_PROVIDER_SITE_OTHER): Admitting: Endocrinology

## 2024-09-03 VITALS — BP 108/70 | HR 58 | Resp 16 | Ht 62.0 in | Wt 164.0 lb

## 2024-09-03 DIAGNOSIS — E0500 Thyrotoxicosis with diffuse goiter without thyrotoxic crisis or storm: Secondary | ICD-10-CM | POA: Diagnosis not present

## 2024-09-03 DIAGNOSIS — E0590 Thyrotoxicosis, unspecified without thyrotoxic crisis or storm: Secondary | ICD-10-CM | POA: Diagnosis not present

## 2024-09-03 NOTE — Progress Notes (Signed)
 Outpatient Endocrinology Note Erin Choung, MD  09/03/24  Patient's Name: Erin Bell    DOB: 06/05/1991    MRN: [id number]  REASON OF VISIT: Follow-up for hyperthyroidism  REFERRING PROVIDER: Eldonna Suzen Octave, MD  PCP: Erin Mliss SAILOR, PA-C  HISTORY OF PRESENT ILLNESS:   Erin Bell is a 33 y.o. old female with past medical history as listed below is follow-up for hyperthyroidism / Graves' disease.    Pertinent Thyroid  History: - Patient is known to have thyroid  disorder in the past, in her first pregnancy in 2014/2015 she had hyperthyroidism and was treated with methimazole  seems to be until 2016.  In 2018 in her second pregnancy she was treated with levothyroxine  and she had taken levothyroxine  seems to be on till 2022 per chart review.  Patient had not been on thyroid  medication for couple of years.   - Patient developed hyperthyroidism in the third trimester of pregnancy, with significantly elevated thyrotropin receptor antibody and started on methimazole  5 mg daily in September 2024.   -Methimazole  was stopped in the end of January 2025 when TSH was elevated to 22. - Patient had normal thyroid  function test in March 2025 and mildly low TSH with normal free T4 and free T3 in July 2025 off of the methimazole .  Interval history  Patient denies palpitation and heat intolerance.  He is overall feeling in usual state of health.  Relatively stable body weight.  No change in bowel habit.  She is no longer breast-feeding.  She has been using birth control method and has no plan for pregnancy in the near future.  Recent thyroid  function test with improvement of TSH and normal free T4 and free T3.    Latest Reference Range & Units 08/30/24 09:26  TSH mIU/L 0.11 (L)  Triiodothyronine,Free,Serum 2.3 - 4.2 pg/mL 2.8  T4,Free(Direct) 0.8 - 1.8 ng/dL 1.0  (L): Data is abnormally low  Heart rate 58 today.  REVIEW OF SYSTEMS:  As per history of present illness.   PAST MEDICAL  HISTORY: Past Medical History:  Diagnosis Date   Chlamydia    Depression    Previously treated with Risperdal   History of hypothyroidism 03/18/2014   Hypothyroid in previous pregnancy, TSH Q trimester, normal on 12/16/22     Hypothyroidism    Syphilis     PAST SURGICAL HISTORY: Past Surgical History:  Procedure Laterality Date   CESAREAN SECTION N/A 01/15/2013   Procedure: CESAREAN SECTION;  Surgeon: Aida DELENA Na, MD;  Location: [facility];  Service: Obstetrics;  Laterality: N/A;   INSERTION OF IMPLANON  ROD  06/13/2023   NOSE SURGERY     WISDOM TOOTH EXTRACTION      ALLERGIES: No Known Allergies  FAMILY HISTORY:  Family History  Problem Relation Age of Onset   Arthritis Father    Birth defects Sister        spina bifida   Asthma Brother    Diabetes Maternal Uncle    Thyroid  disease Mother    Other Neg Hx    Alcohol abuse Neg Hx    Hypertension Neg Hx    Hyperlipidemia Neg Hx    Heart disease Neg Hx    Hearing loss Neg Hx    Early death Neg Hx    Drug abuse Neg Hx    Miscarriages / Stillbirths Neg Hx    Mental retardation Neg Hx    Mental illness Neg Hx    Learning disabilities Neg Hx    Kidney disease  Neg Hx    Stroke Neg Hx    Vision loss Neg Hx     SOCIAL HISTORY: Social History   Socioeconomic History   Marital status: Single    Spouse name: Not on file   Number of children: Not on file   Years of education: Not on file   Highest education level: 12th grade  Occupational History   Not on file  Tobacco Use   Smoking status: Every Day    Current packs/day: 0.25    Types: Cigarettes   Smokeless tobacco: Never  Vaping Use   Vaping status: Never Used  Substance and Sexual Activity   Alcohol use: No    Comment: occasional   Drug use: No   Sexual activity: Yes    Birth control/protection: None  Other Topics Concern   Not on file  Social History Narrative   Not on file   Social Drivers of Health   Financial Resource Strain: Patient Declined  (03/31/2023)   Overall Financial Resource Strain (CARDIA)    Difficulty of Paying Living Expenses: Patient declined  Food Insecurity: Low Risk  (09/03/2024)   Received from [facility]   Hunger Vital Sign    Within the past 12 months, you worried that your food would run out before you got money to buy more: Never true    Within the past 12 months, the food you bought just didn't last and you didn't have money to get more. : Never true  Transportation Needs: No Transportation Needs (09/03/2024)   Received from [facility]    In the past 12 months, has lack of reliable transportation kept you from medical appointments, meetings, work or from getting things needed for daily living? : No  Physical Activity: Insufficiently Active (03/31/2023)   Exercise Vital Sign    Days of Exercise per Week: 1 day    Minutes of Exercise per Session: 10 min  Stress: No Stress Concern Present (03/31/2023)   [employer] - Occupational Stress Questionnaire    Feeling of Stress : Not at all  Social Connections: Not on file    MEDICATIONS:  Current Outpatient Medications  Medication Sig Dispense Refill   fluconazole  (DIFLUCAN ) 150 MG tablet Take 1 tablet PO once. Repeat in 3 days if needed. 2 tablet 0   metroNIDAZOLE  (METROGEL ) 0.75 % vaginal gel Place 1 Applicatorful vaginally at bedtime. 70 g 0   triamcinolone  cream (KENALOG ) 0.1 % Apply 1 Application topically 2 (two) times daily. Apply to affected areas. 30 g 0   No current facility-administered medications for this visit.    PHYSICAL EXAM: Vitals:   09/03/24 0944  BP: 108/70  Pulse: (!) 58  Resp: 16  SpO2: 98%  Weight: 164 lb (74.4 kg)  Height: 5' 2 (1.575 m)     Body mass index is 30 kg/m.  Wt Readings from Last 3 Encounters:  09/03/24 164 lb (74.4 kg)  05/03/24 169 lb 3.2 oz (76.7 kg)  12/22/23 172 lb 9.6 oz (78.3 kg)     General: Well developed, well nourished female in no apparent  distress.  HEENT: AT/[place], no external lesions. Hearing intact to the spoken word Eyes: EOMI. No stare, proptosis or lid lag. Conjunctiva clear and no icterus. No erythema or watering Neck: Trachea midline, neck supple without appreciable thyromegaly or lymphadenopathy and no palpable thyroid  nodules Neurologic: Alert, oriented, normal speech, deep tendon biceps reflexes normal 2+,  no gross focal neurological deficit Extremities: No  pedal pitting edema, no tremors of outstretched hands Skin: Warm, color good.  Psychiatric: Does not appear depressed or anxious  PERTINENT HISTORIC LABORATORY AND IMAGING STUDIES:  All pertinent laboratory results were reviewed. Please see HPI also for further details.   TSH  Date Value Ref Range Status  08/30/2024 0.11 (L) mIU/L Final    Comment:              Reference Range .           > or = 20 Years  0.40-4.50 .                Pregnancy Ranges           First trimester    0.26-2.66           Second trimester   0.55-2.73           Third trimester    0.43-2.91   04/28/2024 0.06 (L) mIU/L Final    Comment:              Reference Range .           > or = 20 Years  0.40-4.50 .                Pregnancy Ranges           First trimester    0.26-2.66           Second trimester   0.55-2.73           Third trimester    0.43-2.91   12/17/2023 0.44 mIU/L Final    Comment:              Reference Range .           > or = 20 Years  0.40-4.50 .                Pregnancy Ranges           First trimester    0.26-2.66           Second trimester   0.55-2.73           Third trimester    0.43-2.91     Lab Results  Component Value Date   FREET4 1.0 08/30/2024   FREET4 1.3 04/28/2024   FREET4 1.2 12/17/2023   T3FREE 2.8 08/30/2024   T3FREE 3.7 04/28/2024   T3FREE 3.4 12/17/2023   TSH 0.11 (L) 08/30/2024   TSH 0.06 (L) 04/28/2024   TSH 0.44 12/17/2023    No results found for: THYROTRECAB  Lab Results  Component Value Date   TSH 0.11 (L)  08/30/2024   TSH 0.06 (L) 04/28/2024   TSH 0.44 12/17/2023   FREET4 1.0 08/30/2024   FREET4 1.3 04/28/2024   FREET4 1.2 12/17/2023     Lab Results  Component Value Date   TSI <89 06/06/2023     No components found for: TRAB    ASSESSMENT / PLAN  1. Graves disease   2. Subclinical hyperthyroidism      -Patient was on and off thyroid  disorder from 2014/2016, she was treated for hypothyroidism and hyperthyroidism in the past. Patient developed hyperthyroidism with significantly elevated thyrotropin receptor antibody, consistent with Graves' disease and methimazole  was started in the late third trimester of pregnancy.  She had delivery in mid September 2024.  She was started on methimazole  5 mg daily from September 2024 and was stopped in the end of January 2025 when TSH was elevated  to 22. - Patient had normal thyroid  function test in March 2025 and had mildly low TSH with normal free T4 and free T3 consistent with subclinical hyperthyroidism after that. - Patient is being monitored without thyroid  medication. - Patient is clinically euthyroid today.  Recent thyroid  function test with mildly low TSH and normal free T4 and free T3.  Plan: -Patient has no hyperthyroid symptoms.  Will monitor without thyroid  medication. -Check thyroid  function test prior to follow-up visit in 6 months. -Discussed about potential hyperthyroid symptoms and asked to call our clinic if develops. - Will check TRAb thyroid  autoantibody for Graves' disease with a neck set of lab.   Diagnoses and all orders for this visit:  Graves disease -     T4, free -     TSH -     T3, free -     TRAb (TSH Receptor Binding Antibody)  Subclinical hyperthyroidism -     T4, free -     TSH -     T3, free    DISPOSITION Follow up in clinic in 6 months suggested.  Labs prior to follow-up visit as ordered.  All questions answered and patient verbalized understanding of the plan.  Cannon Quinton, MD [facility]  Endocrinology [facility] [address] [place], [address] [place], [place] [address] Phone # [phone]   At least part of this note was generated using voice recognition software. Inadvertent word errors may have occurred, which were not recognized during the proofreading process.

## 2024-10-20 ENCOUNTER — Telehealth: Admitting: Nurse Practitioner

## 2024-10-20 DIAGNOSIS — B3731 Acute candidiasis of vulva and vagina: Secondary | ICD-10-CM | POA: Diagnosis not present

## 2024-10-22 MED ORDER — FLUCONAZOLE 150 MG PO TABS: 150.0000 mg | ORAL_TABLET | Freq: Once | ORAL | 1 refills | Status: AC

## 2024-10-22 NOTE — Progress Notes (Signed)

## 2025-02-25 ENCOUNTER — Other Ambulatory Visit

## 2025-03-04 ENCOUNTER — Ambulatory Visit: Admitting: Endocrinology
